# Patient Record
Sex: Male | Born: 1980 | Race: Black or African American | Hispanic: No | Marital: Single | Smoking: Current every day smoker
Health system: Southern US, Community
[De-identification: ages and names within clinical notes are randomized; demographics above are authoritative.]

## PROBLEM LIST (undated history)

## (undated) DIAGNOSIS — F419 Anxiety disorder, unspecified: Secondary | ICD-10-CM

## (undated) DIAGNOSIS — F32A Depression, unspecified: Secondary | ICD-10-CM

## (undated) DIAGNOSIS — F250 Schizoaffective disorder, bipolar type: Secondary | ICD-10-CM

## (undated) DIAGNOSIS — F329 Major depressive disorder, single episode, unspecified: Secondary | ICD-10-CM

## (undated) DIAGNOSIS — F259 Schizoaffective disorder, unspecified: Secondary | ICD-10-CM

---
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
## 2001-06-01 ENCOUNTER — Emergency Department (HOSPITAL_COMMUNITY): Admission: EM | Admit: 2001-06-01 | Discharge: 2001-06-02 | Payer: Self-pay | Admitting: Emergency Medicine

## 2002-04-11 ENCOUNTER — Emergency Department (HOSPITAL_COMMUNITY): Admission: EM | Admit: 2002-04-11 | Discharge: 2002-04-11 | Payer: Self-pay | Admitting: Emergency Medicine

## 2003-12-29 ENCOUNTER — Emergency Department (HOSPITAL_COMMUNITY): Admission: EM | Admit: 2003-12-29 | Discharge: 2003-12-29 | Payer: Self-pay | Admitting: Emergency Medicine

## 2004-05-20 ENCOUNTER — Emergency Department (HOSPITAL_COMMUNITY): Admission: EM | Admit: 2004-05-20 | Discharge: 2004-05-20 | Payer: Self-pay | Admitting: Emergency Medicine

## 2009-01-02 ENCOUNTER — Emergency Department (HOSPITAL_COMMUNITY): Admission: EM | Admit: 2009-01-02 | Discharge: 2009-01-02 | Payer: Self-pay | Admitting: Emergency Medicine

## 2009-02-24 ENCOUNTER — Inpatient Hospital Stay: Payer: Self-pay | Admitting: Psychiatry

## 2009-03-25 ENCOUNTER — Emergency Department: Payer: Self-pay | Admitting: Emergency Medicine

## 2009-08-02 ENCOUNTER — Inpatient Hospital Stay: Payer: Self-pay | Admitting: Psychiatry

## 2009-11-23 ENCOUNTER — Inpatient Hospital Stay: Payer: Self-pay | Admitting: Psychiatry

## 2010-06-27 ENCOUNTER — Inpatient Hospital Stay: Payer: Self-pay | Admitting: Unknown Physician Specialty

## 2010-07-06 ENCOUNTER — Emergency Department (HOSPITAL_COMMUNITY): Admission: EM | Admit: 2010-07-06 | Discharge: 2010-07-06 | Payer: Self-pay | Admitting: Emergency Medicine

## 2010-07-07 ENCOUNTER — Emergency Department (HOSPITAL_COMMUNITY): Admission: EM | Admit: 2010-07-07 | Discharge: 2010-07-07 | Payer: Self-pay | Admitting: Emergency Medicine

## 2010-10-18 ENCOUNTER — Emergency Department (HOSPITAL_COMMUNITY)
Admission: EM | Admit: 2010-10-18 | Discharge: 2010-10-19 | Disposition: A | Discharge status: Other Acute Inpatient Hospital | Payer: Medicare Other | Attending: Emergency Medicine | Admitting: Emergency Medicine

## 2010-10-18 DIAGNOSIS — R45850 Homicidal ideations: Secondary | ICD-10-CM | POA: Insufficient documentation

## 2010-10-18 DIAGNOSIS — F209 Schizophrenia, unspecified: Secondary | ICD-10-CM | POA: Insufficient documentation

## 2010-10-18 LAB — COMPREHENSIVE METABOLIC PANEL
Alkaline Phosphatase: 51 U/L (ref 39–117)
BUN: 12 mg/dL (ref 6–23)
CO2: 29 mEq/L (ref 19–32)
Chloride: 103 mEq/L (ref 96–112)
Creatinine, Ser: 1.1 mg/dL (ref 0.4–1.5)
GFR calc non Af Amer: >60 mL/min (ref >60)
Glucose, Bld: 93 mg/dL (ref 70–99)
Potassium: 4 mEq/L (ref 3.5–5.1)
Total Bilirubin: 0.7 mg/dL (ref 0.3–1.2)

## 2010-10-18 LAB — CBC
HCT: 45.3 % (ref 39.0–52.0)
Hemoglobin: 16.4 g/dL (ref 13.0–17.0)
MCH: 30.8 pg (ref 26.0–34.0)
MCV: 85 fL (ref 78.0–100.0)
Platelets: 227 10*3/uL (ref 150–400)
RBC: 5.33 MIL/uL (ref 4.22–5.81)
WBC: 7.1 10*3/uL (ref 4.0–10.5)

## 2010-10-18 LAB — DIFFERENTIAL
Eosinophils Absolute: 0.2 10*3/uL (ref 0.0–0.7)
Lymphocytes Relative: 48 % — ABNORMAL HIGH (ref 12–46)
Lymphs Abs: 3.4 10*3/uL (ref 0.7–4.0)
Monocytes Relative: 10 % (ref 3–12)
Neutrophils Relative %: 38 % — ABNORMAL LOW (ref 43–77)

## 2010-10-18 LAB — RAPID URINE DRUG SCREEN, HOSP PERFORMED: Cocaine: NOT DETECTED

## 2010-10-19 ENCOUNTER — Inpatient Hospital Stay (HOSPITAL_COMMUNITY)
Admission: AD | Admit: 2010-10-19 | Discharge: 2010-10-23 | DRG: 885 | Disposition: A | Discharge status: Home / Self Care | Payer: Medicaid Other | Source: Ambulatory Visit | Attending: Psychiatry | Admitting: Psychiatry

## 2010-10-19 DIAGNOSIS — F411 Generalized anxiety disorder: Secondary | ICD-10-CM

## 2010-10-19 DIAGNOSIS — F3289 Other specified depressive episodes: Secondary | ICD-10-CM

## 2010-10-19 DIAGNOSIS — R45850 Homicidal ideations: Secondary | ICD-10-CM

## 2010-10-19 DIAGNOSIS — F200 Paranoid schizophrenia: Principal | ICD-10-CM

## 2010-10-19 DIAGNOSIS — R45851 Suicidal ideations: Secondary | ICD-10-CM

## 2010-10-19 DIAGNOSIS — F329 Major depressive disorder, single episode, unspecified: Secondary | ICD-10-CM

## 2010-10-20 DIAGNOSIS — F200 Paranoid schizophrenia: Secondary | ICD-10-CM

## 2010-10-22 NOTE — H&P (Signed)
  NAME:  Jeffrey Coleman, Jeffrey Coleman NO.:  [id number]  MEDICAL RECORD NO.:  [id number]         PATIENT TYPE:  BIPS  LOCATION:  0403                          FACILITY:  [facility]  PHYSICIAN:  Eulogio Ditch, MD DATE OF BIRTH:  September 12, 1980  DATE OF ADMISSION:  10/20/2010 DATE OF DISCHARGE:                      PSYCHIATRIC ADMISSION ASSESSMENT   HISTORY OF PRESENT ILLNESS:  A 30 year old African American male with a history of schizophrenia paranoid type, living with the mother Jeffrey Coleman was admitted as he came to the ER for complaint of hearing voices as he is not on his medications, Haldol and Cogentin.  The patient told me that he started hearing voices after he came out of the jail.  The patient told me that he was on Haldol shot and he went to get his Haldol shot and they told him that his case was terminated.  The patient currently still hearing voices, noncommand type but earlier the patient told that he was hearing voices telling him to stab his mother and stab himself.  The patient denies abuse of any drugs.  MEDICAL HISTORY:  No current medical issues.  No known drug allergies.  PHYSICAL EXAMINATION:  Within normal limits done in [facility]. Labs within normal limits.  SUBSTANCE ABUSE HISTORY:  None.  MENTAL STATUS EXAM:  Calm, cooperative during interview.  Fair eye contact.  No psychomotor agitation or retardation noted during the interview.  Mood "okay".  Affect mood congruent.  Thought form logical and goal-directed.  Thought contents denies suicidal ideations but hearing command hallucinations, not delusional.  Thought perception auditory hallucination positive command type, does not seem to be internally preoccupied.  Cognition alert, awake, oriented x3.  Memory immediate, recent and remote fair.  Attention and concentration fair. Abstraction ability fair.  Fund of knowledge fair.  Insight and judgment intact.  DIAGNOSES:  AXIS I:  Schizophrenia,  paranoid type. AXIS II:  Deferred. AXIS III:  No medical issue. AXIS IV:  Financial issues, have difficulty in getting his medication in the outpatient setting. AXIS V:  30 to 40.  TREATMENT PLAN: 1. The patient will be started on Haldol 5 mg twice a day. 2. The patient will be given Haldol 50 mg dec x4 weeks. 3. Will try to get more collateral information on this patient, will     speak with the mother. 4. Estimated length of stay in the hospital will be 3-4 days. 5. The patient advised to go to all the groups.     Eulogio Ditch, MD     SA/MEDQ  D:  10/20/2010  T:  10/20/2010  Job:  [id number]  Electronically Signed by Eulogio Ditch  on 10/22/2010 06:23:37 AM

## 2010-11-01 NOTE — Discharge Summary (Signed)
Jeffrey Coleman, Jeffrey Coleman             ACCOUNT NO.:  [id number]  MEDICAL RECORD NO.:  [id number]           PATIENT TYPE:  I  LOCATION:  0403                          FACILITY:  [facility]  PHYSICIAN:  Eulogio Ditch, MD DATE OF BIRTH:  1981-08-30  DATE OF ADMISSION:  10/19/2010 DATE OF DISCHARGE:  10/23/2010                              DISCHARGE SUMMARY   IDENTIFYING INFORMATION:  This is a 30 year old African American male, single.  This is a voluntary admission.  HISTORY OF PRESENT ILLNESS:  [facility] admission for Jeffrey Coleman who lives with his mother in [place].  He presented  in our emergency room complaining of hearing voices since he had stopped his medications which were injectable Haldol and Cogentin.  He had been in jail for 20 days for failing to pay child support and had not been able to get into his mental health provider after completing his jail term.  He has a long history of auditory hallucinations which were previously well-controlled on Haldol and exist in subdued fashion when he is taking his medication regularly.  Without his medication he has difficulty sleeping, gets agitated and has difficulty discerning reality from unreal states.  He was concerned that he would harm himself.  He denies any use of substances.  MEDICAL EVALUATION AND DIAGNOSTIC STUDIES:  This is a normally developed [place] American male.  Normal motor.  No tremor or abnormal movements. AIM score 0.  No evidence of substance abuse.  Admitting vital signs: Temperature 98.4, pulse 74, respirations 24, blood pressure 130/84.  CBC normal with a hemoglobin of 16.4.  Urine drug screen negative for all substances.  Alcohol level negative.  Normal electrolytes with a BUN of 12, creatinine 1.10 and normal liver enzymes.  CHRONIC MEDICAL CONDITIONS:  Are none.  ADMITTING MENTAL STATUS EXAM:  Revealed a calm, cooperative, pleasant African American male with fair eye contact.  No agitation  or retardation in movement noted during the interview.  He reported his mood as neutral "okay".  Affect mood congruent.  Thinking logical, goal directed.  Positive for auditory hallucinations command type, but did not appear to be internally preoccupied.  Cognition intact.  Memory intact.  Attention and concentration fair.  Abstraction fair.  Fund of knowledge fair.  Insight and judgment intact.  ADMITTING DIAGNOSES:  AXIS I:  Schizophrenia paranoid type, chronic. AXIS II:  Deferred. AXIS III:  No diagnosis. AXIS IV:  Significant financial issues and difficulty with access to medications. AXIS V:  Current 30-40.  COURSE OF HOSPITALIZATION:  He was admitted to our stabilization unit with a plan to alleviate and control his auditory hallucinations.  We elected to start him back on Haldol 50 mg decanoate q.4 weeks and this was administered on February 17.  We also started him back on Haldol 5 mg p.o. twice daily along with benztropine 1 mg daily at bedtime.  He was gradually assimilated into the milieu.  He was pleasant and cooperative with peers and staff throughout his stay.  His participation in group therapy was appropriate.  He reported interest in returning to [facility] program in [place].  He planned on  returning to live with his mother but did not give [place] a release to speak with her.  By February 20 his hallucinations were diminished, fully alert, bright affect, no dangerous ideas, felt much better on the medications. Jeffrey Coleman was given information on how to get started back to services by going through [facility] on a walk-in basis.  He was given prescriptions and a 14 day supply of benztropine and oral Haldol.  DISCHARGE DIAGNOSES:  AXIS I:  Schizophrenia, paranoid type, chronic. AXIS II:  No diagnosis. AXIS III:  No diagnosis. AXIS IV:  Some financial constraints, having a stable home and supportive mother is an asset. AXIS V:  Current 60, past year not  known.  DISCHARGE MEDICATIONS: 1. Haldol decanoate 50 mg q.4 weeks IM, last given October 20, 2010. 2. Haldol 5 mg b.i.d. 3. Benztropine 1 mg p.o. q.h.s.  DISCHARGE PLAN:  [facility] in [place], [place], phone number [phone], walk in and sign up for treatment.     Margaret A. Lorin Picket, N.P.   ______________________________ Eulogio Ditch, MD    MAS/MEDQ  D:  10/24/2010  T:  10/24/2010  Job:  [id number]  Electronically Signed by Kari Baars N.P. on 10/30/2010 10:33:06 AM Electronically Signed by Eulogio Ditch  on 11/01/2010 01:03:10 PM

## 2010-12-12 LAB — BASIC METABOLIC PANEL
CO2: 22 mEq/L (ref 19–32)
Calcium: 9 mg/dL (ref 8.4–10.5)
GFR calc Af Amer: >60 mL/min (ref >60)
GFR calc non Af Amer: >60 mL/min (ref >60)
Glucose, Bld: 126 mg/dL — ABNORMAL HIGH (ref 70–99)
Potassium: 3.9 mEq/L (ref 3.5–5.1)
Sodium: 140 mEq/L (ref 135–145)

## 2010-12-12 LAB — CBC
HCT: 39.3 % (ref 39.0–52.0)
Hemoglobin: 13.9 g/dL (ref 13.0–17.0)
MCHC: 35.3 g/dL (ref 30.0–36.0)
RBC: 4.65 MIL/uL (ref 4.22–5.81)
RDW: 13.2 % (ref 11.5–15.5)

## 2010-12-12 LAB — DIFFERENTIAL
Basophils Absolute: 0.1 10*3/uL (ref 0.0–0.1)
Eosinophils Relative: 1 % (ref 0–5)
Lymphocytes Relative: 39 % (ref 12–46)
Monocytes Absolute: 0.5 10*3/uL (ref 0.1–1.0)
Monocytes Relative: 7 % (ref 3–12)
Neutro Abs: 4.4 10*3/uL (ref 1.7–7.7)

## 2010-12-12 LAB — RAPID URINE DRUG SCREEN, HOSP PERFORMED
Cocaine: NEGATIVE — AB
Opiates: NEGATIVE — AB
Tetrahydrocannabinol: NEGATIVE — AB

## 2012-04-09 LAB — DRUG SCREEN, URINE
Barbiturates, Ur Screen: NEGATIVE (ref ?–200)
Cocaine Metabolite,Ur ~~LOC~~: NEGATIVE (ref ?–300)
MDMA (Ecstasy)Ur Screen: NEGATIVE (ref ?–500)
Phencyclidine (PCP) Ur S: NEGATIVE (ref ?–25)
Tricyclic, Ur Screen: NEGATIVE (ref ?–1000)

## 2012-04-09 LAB — CBC
HCT: 54.6 % — ABNORMAL HIGH (ref 40.0–52.0)
MCH: 31.6 pg (ref 26.0–34.0)
MCV: 90 fL (ref 80–100)
Platelet: 230 10*3/uL (ref 150–440)
RBC: 6.07 10*6/uL — ABNORMAL HIGH (ref 4.40–5.90)
WBC: 9.6 10*3/uL (ref 3.8–10.6)

## 2012-04-09 LAB — BASIC METABOLIC PANEL
Chloride: 106 mmol/L (ref 98–107)
Co2: 28 mmol/L (ref 21–32)
Creatinine: 1.07 mg/dL (ref 0.60–1.30)
EGFR (African American): >60
Glucose: 85 mg/dL (ref 65–99)
Potassium: 4 mmol/L (ref 3.5–5.1)
Sodium: 139 mmol/L (ref 136–145)

## 2012-04-09 LAB — URINALYSIS, COMPLETE
Bilirubin,UR: NEGATIVE
Blood: NEGATIVE
Glucose,UR: NEGATIVE mg/dL (ref 0–75)
Ketone: NEGATIVE
Protein: NEGATIVE
RBC,UR: NONE SEEN /HPF (ref 0–5)
Specific Gravity: 1.003 (ref 1.003–1.030)
Squamous Epithelial: <1
WBC UR: NONE SEEN /HPF (ref 0–5)

## 2012-04-10 ENCOUNTER — Inpatient Hospital Stay: Payer: Self-pay | Admitting: Psychiatry

## 2014-06-23 ENCOUNTER — Emergency Department: Payer: Self-pay | Admitting: Emergency Medicine

## 2014-07-28 ENCOUNTER — Encounter (HOSPITAL_COMMUNITY): Payer: Self-pay | Admitting: *Deleted

## 2014-07-28 ENCOUNTER — Other Ambulatory Visit: Payer: Self-pay

## 2014-07-28 ENCOUNTER — Inpatient Hospital Stay (HOSPITAL_COMMUNITY)
Admission: AD | Admit: 2014-07-28 | Discharge: 2014-07-31 | DRG: 885 | Disposition: A | Discharge status: Home / Self Care | Payer: Medicare Other | Source: Intra-hospital | Attending: Psychiatry | Admitting: Psychiatry

## 2014-07-28 ENCOUNTER — Emergency Department (HOSPITAL_COMMUNITY)
Admission: EM | Admit: 2014-07-28 | Discharge: 2014-07-28 | Disposition: A | Discharge status: Home / Self Care | Payer: Medicare Other | Source: Home / Self Care | Attending: Emergency Medicine | Admitting: Emergency Medicine

## 2014-07-28 ENCOUNTER — Encounter (HOSPITAL_COMMUNITY): Payer: Self-pay | Admitting: Emergency Medicine

## 2014-07-28 DIAGNOSIS — R440 Auditory hallucinations: Secondary | ICD-10-CM

## 2014-07-28 DIAGNOSIS — F29 Unspecified psychosis not due to a substance or known physiological condition: Secondary | ICD-10-CM | POA: Diagnosis not present

## 2014-07-28 DIAGNOSIS — R45851 Suicidal ideations: Secondary | ICD-10-CM

## 2014-07-28 DIAGNOSIS — Z79899 Other long term (current) drug therapy: Secondary | ICD-10-CM

## 2014-07-28 DIAGNOSIS — F200 Paranoid schizophrenia: Principal | ICD-10-CM | POA: Diagnosis present

## 2014-07-28 DIAGNOSIS — F17210 Nicotine dependence, cigarettes, uncomplicated: Secondary | ICD-10-CM | POA: Diagnosis present

## 2014-07-28 HISTORY — DX: Schizoaffective disorder, unspecified: F25.9

## 2014-07-28 HISTORY — DX: Schizoaffective disorder, bipolar type: F25.0

## 2014-07-28 LAB — COMPREHENSIVE METABOLIC PANEL
ALK PHOS: 96 U/L (ref 39–117)
ALT: 28 U/L (ref 0–53)
AST: 26 U/L (ref 0–37)
Albumin: 3.4 g/dL — ABNORMAL LOW (ref 3.5–5.2)
Anion gap: 13 (ref 5–15)
BUN: 7 mg/dL (ref 6–23)
CHLORIDE: 93 meq/L — AB (ref 96–112)
CO2: 26 meq/L (ref 19–32)
Calcium: 9.2 mg/dL (ref 8.4–10.5)
Creatinine, Ser: 0.89 mg/dL (ref 0.50–1.35)
GLUCOSE: 107 mg/dL — AB (ref 70–99)
POTASSIUM: 4.4 meq/L (ref 3.7–5.3)
SODIUM: 132 meq/L — AB (ref 137–147)
Total Bilirubin: 1.3 mg/dL — ABNORMAL HIGH (ref 0.3–1.2)
Total Protein: 8.1 g/dL (ref 6.0–8.3)

## 2014-07-28 LAB — URINALYSIS, ROUTINE W REFLEX MICROSCOPIC
BILIRUBIN URINE: NEGATIVE
Glucose, UA: NEGATIVE mg/dL
Ketones, ur: NEGATIVE mg/dL
Leukocytes, UA: NEGATIVE
NITRITE: NEGATIVE
PROTEIN: NEGATIVE mg/dL
SPECIFIC GRAVITY, URINE: 1.01 (ref 1.005–1.030)
UROBILINOGEN UA: 4 mg/dL — AB (ref 0.0–1.0)
pH: 6 (ref 5.0–8.0)

## 2014-07-28 LAB — CBC WITH DIFFERENTIAL/PLATELET
Basophils Absolute: 0 10*3/uL (ref 0.0–0.1)
Basophils Relative: 0 % (ref 0–1)
Eosinophils Absolute: 0 10*3/uL (ref 0.0–0.7)
Eosinophils Relative: 0 % (ref 0–5)
HCT: 50.2 % (ref 39.0–52.0)
Hemoglobin: 18.6 g/dL — ABNORMAL HIGH (ref 13.0–17.0)
LYMPHS ABS: 2.6 10*3/uL (ref 0.7–4.0)
LYMPHS PCT: 17 % (ref 12–46)
MCH: 32.3 pg (ref 26.0–34.0)
MCHC: 37.1 g/dL — ABNORMAL HIGH (ref 30.0–36.0)
MCV: 87.2 fL (ref 78.0–100.0)
Monocytes Absolute: 1.6 10*3/uL — ABNORMAL HIGH (ref 0.1–1.0)
Monocytes Relative: 11 % (ref 3–12)
NEUTROS ABS: 11 10*3/uL — AB (ref 1.7–7.7)
NEUTROS PCT: 72 % (ref 43–77)
PLATELETS: 265 10*3/uL (ref 150–400)
RBC: 5.76 MIL/uL (ref 4.22–5.81)
RDW: 13.8 % (ref 11.5–15.5)
WBC: 15.2 10*3/uL — AB (ref 4.0–10.5)

## 2014-07-28 LAB — RAPID URINE DRUG SCREEN, HOSP PERFORMED
Amphetamines: NOT DETECTED
BARBITURATES: NOT DETECTED
BENZODIAZEPINES: NOT DETECTED
Cocaine: NOT DETECTED
Opiates: NOT DETECTED
TETRAHYDROCANNABINOL: NOT DETECTED

## 2014-07-28 LAB — ETHANOL

## 2014-07-28 LAB — URINE MICROSCOPIC-ADD ON

## 2014-07-28 MED ORDER — BENZTROPINE MESYLATE 2 MG PO TABS
3.0000 mg | ORAL_TABLET | Freq: Every day | ORAL | Status: DC
  Administered 2014-07-28 – 2014-07-30 (×3): 3 mg via ORAL
  Filled 2014-07-28 (×4): qty 1

## 2014-07-28 MED ORDER — NICOTINE 21 MG/24HR TD PT24
21.0000 mg | MEDICATED_PATCH | Freq: Every day | TRANSDERMAL | Status: DC
  Filled 2014-07-28 (×6): qty 1

## 2014-07-28 MED ORDER — LORAZEPAM 1 MG PO TABS: 1.0000 mg | ORAL_TABLET | Freq: Three times a day (TID) | ORAL | Status: DC | PRN

## 2014-07-28 MED ORDER — HALOPERIDOL 2 MG PO TABS
ORAL_TABLET | ORAL | Status: AC
  Filled 2014-07-28: qty 1

## 2014-07-28 MED ORDER — HALOPERIDOL 0.5 MG PO TABS
0.5000 mg | ORAL_TABLET | Freq: Two times a day (BID) | ORAL | Status: DC
  Administered 2014-07-28: 0.5 mg via ORAL
  Filled 2014-07-28 (×7): qty 1

## 2014-07-28 MED ORDER — BENZTROPINE MESYLATE 1 MG PO TABS
1.0000 mg | ORAL_TABLET | Freq: Two times a day (BID) | ORAL | Status: DC
  Filled 2014-07-28 (×3): qty 1

## 2014-07-28 MED ORDER — HALOPERIDOL 0.5 MG PO TABS
0.5000 mg | ORAL_TABLET | Freq: Two times a day (BID) | ORAL | Status: DC
  Filled 2014-07-28 (×3): qty 1

## 2014-07-28 MED ORDER — ACETAMINOPHEN 325 MG PO TABS
650.0000 mg | ORAL_TABLET | Freq: Four times a day (QID) | ORAL | Status: DC | PRN
  Administered 2014-07-28 – 2014-07-31 (×5): 650 mg via ORAL
  Filled 2014-07-28 (×5): qty 2

## 2014-07-28 MED ORDER — HALOPERIDOL 5 MG PO TABS
5.0000 mg | ORAL_TABLET | Freq: Every day | ORAL | Status: DC
  Administered 2014-07-29 – 2014-07-30 (×2): 5 mg via ORAL
  Filled 2014-07-28 (×4): qty 1

## 2014-07-28 MED ORDER — QUETIAPINE FUMARATE ER 50 MG PO TB24
50.0000 mg | ORAL_TABLET | Freq: Every day | ORAL | Status: DC
  Administered 2014-07-28 – 2014-07-29 (×2): 50 mg via ORAL
  Filled 2014-07-28 (×4): qty 1

## 2014-07-28 MED ORDER — MAGNESIUM HYDROXIDE 400 MG/5ML PO SUSP: 30.0000 mL | Freq: Every day | ORAL | Status: DC | PRN

## 2014-07-28 MED ORDER — ALUM & MAG HYDROXIDE-SIMETH 200-200-20 MG/5ML PO SUSP: 30.0000 mL | ORAL | Status: DC | PRN

## 2014-07-28 MED ORDER — BENZTROPINE MESYLATE 1 MG PO TABS
ORAL_TABLET | ORAL | Status: AC
  Filled 2014-07-28: qty 3

## 2014-07-28 MED ORDER — HALOPERIDOL 1 MG PO TABS
1.0000 mg | ORAL_TABLET | Freq: Once | ORAL | Status: AC
  Administered 2014-07-28: 1 mg via ORAL
  Filled 2014-07-28: qty 1

## 2014-07-28 MED ORDER — NICOTINE 21 MG/24HR TD PT24: 21.0000 mg | MEDICATED_PATCH | Freq: Every day | TRANSDERMAL | Status: DC

## 2014-07-28 MED ORDER — HALOPERIDOL 5 MG PO TABS
10.0000 mg | ORAL_TABLET | Freq: Every day | ORAL | Status: DC
  Filled 2014-07-28: qty 2

## 2014-07-28 MED ORDER — ONDANSETRON HCL 4 MG PO TABS: 4.0000 mg | ORAL_TABLET | Freq: Three times a day (TID) | ORAL | Status: DC | PRN

## 2014-07-28 NOTE — ED Notes (Signed)
Per [facility], pt is having suicidal thoughts, stop taking meds approx 6 weeks ago.

## 2014-07-28 NOTE — Tx Team (Signed)
Initial Interdisciplinary Treatment Plan   PATIENT STRESSORS: Medication change or noncompliance   PATIENT STRENGTHS: Ability for insight Active sense of humor Capable of independent living Communication skills   PROBLEM LIST: Problem List/Patient Goals Date to be addressed Date deferred Reason deferred Estimated date of resolution  psychosis 07/28/14   dc                                                   DISCHARGE CRITERIA:  Ability to meet basic life and health needs Improved stabilization in mood, thinking, and/or behavior Motivation to continue treatment in a less acute level of care Reduction of life-threatening or endangering symptoms to within safe limits Verbal commitment to aftercare and medication compliance  PRELIMINARY DISCHARGE PLAN: Attend aftercare/continuing care group Attend 12-step recovery group Placement in alternative living arrangements Return to previous living arrangement  PATIENT/FAMIILY INVOLVEMENT: This treatment plan has been presented to and reviewed with the patient, Jeffrey Coleman, and/or family member, .  The patient and family have been given the opportunity to ask questions and make suggestions.  Jeffrey Coleman 07/28/2014, 5:28 PM

## 2014-07-28 NOTE — ED Notes (Signed)
Patient wand by Security and changed into wine paper scrubs. Patient assisted to restroom obtained urine sample. Patient cooperative at this time. Sitter has patient in view.
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
## 2014-07-28 NOTE — ED Notes (Signed)
Report attempted with [facility] and was told to call back in 51min to give report.

## 2014-07-28 NOTE — ED Notes (Signed)
[facility] called and ETA 19min for transport.

## 2014-07-28 NOTE — ED Notes (Signed)
Report given to [place], Therapist, sports for [facility] room [phone].

## 2014-07-28 NOTE — ED Notes (Signed)
Pt has start hearing voices telling him to cut his wrist to end his life, not currently taking psych meds.

## 2014-07-28 NOTE — ED Notes (Signed)
Tele psych in progress at this time. 

## 2014-07-28 NOTE — ED Provider Notes (Signed)
CSN: [id number]     Arrival date & time 07/28/14  0136 History   First MD Initiated Contact with Patient 07/28/14 0248     Chief Complaint  Patient presents with  . Suicidal Thoughts     (Consider location/radiation/quality/duration/timing/severity/associated sxs/prior Treatment) HPI  This is a 33 year old male with a history of schizophrenia. He is not out of his medication but has not been compliant with it recently thinking he was "straight" without it. As a result he is developed suicidal thoughts over the past 3 days which acutely worsened this morning. The severity of the suicidal thoughts scared him and so he called EMS for transport to the ED. He states he always has auditory hallucinations but only recently have they been telling him how to kill himself, specifically to cut his wrist. He has no somatic complaints. He denies HI.  Past Medical History  Diagnosis Date  . Schizo affective schizophrenia    History reviewed. No pertinent past surgical history. Family History  Problem Relation Age of Onset  . Cancer Father    History  Substance Use Topics  . Smoking status: Current Every Day Smoker -- 0.50 packs/day for 15 years    Types: Cigarettes  . Smokeless tobacco: Never Used  . Alcohol Use: No    Review of Systems  All other systems reviewed and are negative.   Allergies  Review of patient's allergies indicates no known allergies.  Home Medications   Prior to Admission medications   Medication Sig Start Date End Date Taking? Authorizing Provider  haloperidol (HALDOL) 0.5 MG tablet Take 0.5 mg by mouth 2 (two) times daily.   Yes Historical Provider, MD   BP 125/86 mmHg  Pulse 81  Temp(Src) 99.1 F (37.3 C) (Oral)  Resp 20  Ht 5\' 4"  (1.626 m)  Wt 140 lb (63.504 kg)  BMI 24.02 kg/m2  SpO2 98%   Physical Exam  General: Well-developed, well-nourished male in no acute distress; appearance consistent with age of record HENT: normocephalic; atraumatic Eyes:  pupils equal, round and reactive to light; extraocular muscles intact Neck: supple Heart: regular rate and rhythm Lungs: clear to auscultation bilaterally Abdomen: soft; nondistended; nontender; no masses or hepatosplenomegaly; bowel sounds present Extremities: No deformity; full range of motion; pulses normal Neurologic: Awake, alert and oriented; motor function intact in all extremities and symmetric; no facial droop Skin: Warm and dry Psychiatric: Pleasant; cooperative; auditory hallucinations; suicidal ideation; no homicidal ideation    ED Course  Procedures (including critical care time)   MDM   Nursing notes and vitals signs, including pulse oximetry, reviewed.  Summary of this visit's results, reviewed by myself:  Labs:  Results for orders placed or performed during the hospital encounter of 07/28/14 (from the past 24 hour(s))  Drug screen panel, emergency     Status: None   Collection Time: 07/28/14  1:40 AM  Result Value Ref Range   Opiates NONE DETECTED NONE DETECTED   Cocaine NONE DETECTED NONE DETECTED   Benzodiazepines NONE DETECTED NONE DETECTED   Amphetamines NONE DETECTED NONE DETECTED   Tetrahydrocannabinol NONE DETECTED NONE DETECTED   Barbiturates NONE DETECTED NONE DETECTED  Urinalysis, Routine w reflex microscopic     Status: Abnormal   Collection Time: 07/28/14  1:40 AM  Result Value Ref Range   Color, Urine AMBER (A) YELLOW   APPearance CLEAR CLEAR   Specific Gravity, Urine 1.010 1.005 - 1.030   pH 6.0 5.0 - 8.0   Glucose, UA NEGATIVE NEGATIVE mg/dL  Hgb urine dipstick TRACE (A) NEGATIVE   Bilirubin Urine NEGATIVE NEGATIVE   Ketones, ur NEGATIVE NEGATIVE mg/dL   Protein, ur NEGATIVE NEGATIVE mg/dL   Urobilinogen, UA 4.0 (H) 0.0 - 1.0 mg/dL   Nitrite NEGATIVE NEGATIVE   Leukocytes, UA NEGATIVE NEGATIVE  Urine microscopic-add on     Status: None   Collection Time: 07/28/14  1:40 AM  Result Value Ref Range   Squamous Epithelial / LPF RARE RARE    WBC, UA 0-2 <3 WBC/hpf   RBC / HPF 0-2 <3 RBC/hpf   Bacteria, UA RARE RARE  CBC WITH DIFFERENTIAL     Status: Abnormal   Collection Time: 07/28/14  2:08 AM  Result Value Ref Range   WBC 15.2 (H) 4.0 - 10.5 K/uL   RBC 5.76 4.22 - 5.81 MIL/uL   Hemoglobin 18.6 (H) 13.0 - 17.0 g/dL   HCT 50.2 39.0 - 52.0 %   MCV 87.2 78.0 - 100.0 fL   MCH 32.3 26.0 - 34.0 pg   MCHC 37.1 (H) 30.0 - 36.0 g/dL   RDW 13.8 11.5 - 15.5 %   Platelets 265 150 - 400 K/uL   Neutrophils Relative % 72 43 - 77 %   Neutro Abs 11.0 (H) 1.7 - 7.7 K/uL   Lymphocytes Relative 17 12 - 46 %   Lymphs Abs 2.6 0.7 - 4.0 K/uL   Monocytes Relative 11 3 - 12 %   Monocytes Absolute 1.6 (H) 0.1 - 1.0 K/uL   Eosinophils Relative 0 0 - 5 %   Eosinophils Absolute 0.0 0.0 - 0.7 K/uL   Basophils Relative 0 0 - 1 %   Basophils Absolute 0.0 0.0 - 0.1 K/uL  Comprehensive metabolic panel     Status: Abnormal   Collection Time: 07/28/14  2:08 AM  Result Value Ref Range   Sodium 132 (L) 137 - 147 mEq/L   Potassium 4.4 3.7 - 5.3 mEq/L   Chloride 93 (L) 96 - 112 mEq/L   CO2 26 19 - 32 mEq/L   Glucose, Bld 107 (H) 70 - 99 mg/dL   BUN 7 6 - 23 mg/dL   Creatinine, Ser 0.89 0.50 - 1.35 mg/dL   Calcium 9.2 8.4 - 10.5 mg/dL   Total Protein 8.1 6.0 - 8.3 g/dL   Albumin 3.4 (L) 3.5 - 5.2 g/dL   AST 26 0 - 37 U/L   ALT 28 0 - 53 U/L   Alkaline Phosphatase 96 39 - 117 U/L   Total Bilirubin 1.3 (H) 0.3 - 1.2 mg/dL   GFR calc non Af Amer >90 >90 mL/min   GFR calc Af Amer >90 >90 mL/min   Anion gap 13 5 - 15  Ethanol     Status: None   Collection Time: 07/28/14  2:08 AM  Result Value Ref Range   Alcohol, Ethyl (B) <11 0 - 11 mg/dL      Wynetta Fines, MD 07/28/14 [phone]

## 2014-07-28 NOTE — Progress Notes (Signed)
Patient ID: Jeffrey Coleman, male   DOB: 10-28-1980, 33 y.o.   MRN: [id number] PER STATE REGULATIONS 482.30  THIS CHART WAS REVIEWED FOR MEDICAL NECESSITY WITH RESPECT TO THE PATIENT'S ADMISSION/DURATION OF STAY.  NEXT REVIEW DATE: 08/01/14  Roma Schanz, RN, BSN CASE MANAGER

## 2014-07-28 NOTE — Progress Notes (Addendum)
Pt has been accepted to [facility], room 506-2, accepting Catalina Pizza, NP/Dr. Lucienne Minks RN aware. Pt is voluntary.  Charlene Brooke, MSW  Social Worker [phone]

## 2014-07-28 NOTE — BH Assessment (Signed)
Tele Assessment Note   Jeffrey Coleman is a 33 y.o. male who voluntarily presents to [facility] with SI thoughts and AH w/command to harm self.  Pt denies plan but states--"If I were going to do it, I would slit my wrists".  Pt reports SI thoughts x3days and has been consistently hearing voices for 3 yrs.  Pt admits that he stopped taking his medication approx  6wks ago because he was "feeling better".  Pt reports 1 previous SI attempt 1.5 yrs ago by cutting himself.  Pt has outpatient services with [facility] and states he attends group on Mondays and will be returning to his psych to being medication again.  Pt denies any depressive sxs.  He is not responding to internal stimuli and is lucid during the interview with this Probation officer, communicates well and understand his schizophrenia dx.     Axis I: Schizophrenia Axis II: Deferred Axis III:  Past Medical History  Diagnosis Date  . Schizo affective schizophrenia    Axis IV: other psychosocial or environmental problems, problems related to social environment, problems with access to health care services and problems with primary support group Axis V: 31-40 impairment in reality testing  Past Medical History:  Past Medical History  Diagnosis Date  . Schizo affective schizophrenia     History reviewed. No pertinent past surgical history.  Family History:  Family History  Problem Relation Age of Onset  . Cancer Father     Social History:  reports that he has been smoking Cigarettes.  He has a 7.5 pack-year smoking history. He has never used smokeless tobacco. He reports that he does not drink alcohol or use illicit drugs.  Additional Social History:     CIWA: CIWA-Ar BP: 125/86 mmHg Pulse Rate: 81 COWS:    PATIENT STRENGTHS: (choose at least two) General fund of knowledge  Allergies: No Known Allergies  Home Medications:  (Not in a hospital admission)  OB/GYN Status:  No LMP for male patient.  General Assessment Data Location  of Assessment: [facility] ED Is this a Tele or Face-to-Face Assessment?: Tele Assessment Is this an Initial Assessment or a Re-assessment for this encounter?: Initial Assessment Living Arrangements: Parent (Lives with mother ) Can pt return to current living arrangement?: Yes Admission Status: Voluntary Is patient capable of signing voluntary admission?: Yes Transfer from: Home Referral Source: Self/Family/Friend  Medical Screening Exam ([place]) Medical Exam completed: No Reason for MSE not completed: Other: (None )  [place] Living Arrangements: Parent (Lives with mother ) Name of Psychiatrist: Armen Coleman  Name of Therapist: Armen Coleman   Education Status Is patient currently in school?: No Current Grade: None  Highest grade of school patient has completed: None  Name of school: None  Contact person: None   Risk to self with the past 6 months Suicidal Ideation: Yes-Currently Present Suicidal Intent: No-Not Currently/Within Last 6 Months Is patient at risk for suicide?: Yes Suicidal Plan?: No-Not Currently/Within Last 6 Months ("If was going to do it, I would cut myself with a box cutter) Access to Means: Yes Specify Access to Suicidal Means: Sharps, Pills  What has been your use of drugs/alcohol within the last 12 months?: Pt denies  Previous Attempts/Gestures: Yes How many times?: 1 Other Self Harm Risks: None  Triggers for Past Attempts: Unpredictable Intentional Self Injurious Behavior: None Family Suicide History: No Recent stressful life event(s): Other (Comment) (Off meds x 1 month ) Persecutory voices/beliefs?: No Depression: No Depression Symptoms:  (  None reported ) Substance abuse history and/or treatment for substance abuse?: Yes Suicide prevention information given to non-admitted patients: Not applicable  Risk to Others within the past 6 months Homicidal Ideation: No Thoughts of Harm to Others: No Current Homicidal Intent: No Current  Homicidal Plan: No Access to Homicidal Means: No Identified Victim: None  History of harm to others?: No Assessment of Violence: None Noted Violent Behavior Description: None  Does patient have access to weapons?: No Criminal Charges Pending?: No Does patient have a court date: No  Psychosis Hallucinations: Auditory, With command Delusions: None noted  Mental Status Report Appear/Hygiene: In scrubs Eye Contact: Good Motor Activity: Unremarkable Speech: Logical/coherent Level of Consciousness: Alert, Quiet/awake Mood: Preoccupied Affect: Preoccupied Anxiety Level: None Thought Processes: Coherent, Relevant Judgement: Impaired Orientation: Person, Place, Time, Situation Obsessive Compulsive Thoughts/Behaviors: None  Cognitive Functioning Concentration: Normal Memory: Recent Intact, Remote Intact IQ: Average Insight: Poor Impulse Control: Fair Appetite: Fair Weight Loss: 0 Weight Gain: 0 Sleep: No Change Total Hours of Sleep: 5 Vegetative Symptoms: None  ADLScreening [facility]) Patient's cognitive ability adequate to safely complete daily activities?: Yes Patient able to express need for assistance with ADLs?: Yes Independently performs ADLs?: Yes (appropriate for developmental age)  Prior Inpatient Therapy Prior Inpatient Therapy: Yes Prior Therapy Dates: 2012,2014 Prior Therapy Facilty/Provider(s): [facility], [place]  Reason for Treatment: Schizophrenia   Prior Outpatient Therapy Prior Outpatient Therapy: Yes Prior Therapy Dates: Current  Prior Therapy Facilty/Provider(s): [facility]  Reason for Treatment: Med Mgt/ Therapy   ADL Screening (condition at time of admission) Patient's cognitive ability adequate to safely complete daily activities?: Yes Patient able to express need for assistance with ADLs?: Yes Independently performs ADLs?: Yes (appropriate for developmental age)             Regulatory affairs officer (For Healthcare) Does  patient have an advance directive?: No Would patient like information on creating an advanced directive?: No - patient declined information    Additional Information 1:1 In Past 12 Months?: No CIRT Risk: No Elopement Risk: No Does patient have medical clearance?: Yes     Disposition:  Disposition Initial Assessment Completed for this Encounter: Yes Disposition of Patient: Inpatient treatment program, Referred to [name], [place] recommend inpt admission ) Type of inpatient treatment program: Adult Patient referred to: Other (Comment) [name], [place] recommend inpt admission )  Girtha Rm 07/28/2014 4:59 AM

## 2014-07-29 DIAGNOSIS — F209 Schizophrenia, unspecified: Secondary | ICD-10-CM

## 2014-07-29 DIAGNOSIS — R45851 Suicidal ideations: Secondary | ICD-10-CM

## 2014-07-29 DIAGNOSIS — F200 Paranoid schizophrenia: Secondary | ICD-10-CM | POA: Insufficient documentation

## 2014-07-29 LAB — CBC WITH DIFFERENTIAL/PLATELET
Band Neutrophils: 0 % (ref 0–10)
Basophils Absolute: 0 10*3/uL (ref 0.0–0.1)
Basophils Relative: 0 % (ref 0–1)
Blasts: 0 %
EOS ABS: 0 10*3/uL (ref 0.0–0.7)
EOS PCT: 0 % (ref 0–5)
HCT: 47.4 % (ref 39.0–52.0)
HEMOGLOBIN: 17.4 g/dL — AB (ref 13.0–17.0)
LYMPHS ABS: 2.3 10*3/uL (ref 0.7–4.0)
LYMPHS PCT: 16 % (ref 12–46)
MCH: 31.9 pg (ref 26.0–34.0)
MCHC: 36.7 g/dL — ABNORMAL HIGH (ref 30.0–36.0)
MCV: 86.8 fL (ref 78.0–100.0)
MONO ABS: 1.6 10*3/uL — AB (ref 0.1–1.0)
Metamyelocytes Relative: 0 %
Monocytes Relative: 11 % (ref 3–12)
Myelocytes: 0 %
NEUTROS ABS: 10.6 10*3/uL — AB (ref 1.7–7.7)
NEUTROS PCT: 73 % (ref 43–77)
Platelets: 304 10*3/uL (ref 150–400)
Promyelocytes Absolute: 0 %
RBC: 5.46 MIL/uL (ref 4.22–5.81)
RDW: 13.7 % (ref 11.5–15.5)
WBC: 14.5 10*3/uL — ABNORMAL HIGH (ref 4.0–10.5)
nRBC: 0 /100 WBC

## 2014-07-29 LAB — INFLUENZA PANEL BY PCR (TYPE A & B)
H1N1 flu by pcr: NOT DETECTED
INFLAPCR: NEGATIVE
Influenza B By PCR: NEGATIVE

## 2014-07-29 MED ORDER — BENZTROPINE MESYLATE 1 MG PO TABS
ORAL_TABLET | ORAL | Status: AC
  Filled 2014-07-29: qty 3

## 2014-07-29 MED ORDER — IBUPROFEN 600 MG PO TABS
600.0000 mg | ORAL_TABLET | ORAL | Status: DC | PRN
  Administered 2014-07-29: 600 mg via ORAL
  Filled 2014-07-29: qty 1
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
## 2014-07-29 NOTE — Progress Notes (Signed)
Writer spoke with patient 1:1 and he c/o headache and requested a tylenol. He reported that he had vomited in his trash can and writer observed sweat on his forehead while talking with him. Patient denies feeling achy and chills temperature was taken at 2110 and was 99.4. Patient c/o having a cough for about 2 days before being admitted. He denies si/hi and visual hallucinations but is positive for voices and reports that he always hears voices but his medications helps with them. He was did not attend group this evening. Writer asked that he Biochemist, clinical or a staff member know if if gets sick again and gatorade given for him to drink. Safety maintained on unit with 15 min checks.

## 2014-07-29 NOTE — [facility]HH Suicide Risk Assessment (Signed)
   Nursing information obtained from:  Patient Demographic factors:  Male, Adolescent or young adult, Low socioeconomic status Current Mental Status:  Self-harm thoughts Loss Factors:  NA Historical Factors:  Impulsivity Risk Reduction Factors:  Living with another person, especially a relative Total Time spent with patient: 1 hour  CLINICAL FACTORS:   Schizophrenia:   Command hallucinatons Depressive state Less than 33 years old Paranoid or undifferentiated type More than one psychiatric diagnosis Currently Psychotic Previous Psychiatric Diagnoses and Treatments  Psychiatric Specialty Exam: Physical Exam  ROS  Blood pressure 118/76, pulse 106, temperature 101.2 F (38.4 C), temperature source Oral, resp. rate 17, height 5' 3.25" (1.607 m), weight 59.875 kg (132 lb).Body mass index is 23.19 kg/(m^2).  General Appearance: Disheveled  Eye Contact::  Good  Speech:  Slow  Volume:  Decreased  Mood:  Depressed and Hopeless  Affect:  Constricted, Depressed and Restricted  Thought Process:  Coherent  Orientation:  Full (Time, Place, and Person)  Thought Content:  Hallucinations: Auditory and Paranoid Ideation  Suicidal Thoughts:  Yes.  with intent/plan  Homicidal Thoughts:  No  Memory:  Immediate;   Fair Recent;   Fair Remote;   Fair  Judgement:  Impaired  Insight:  Lacking  Psychomotor Activity:  Decreased  Concentration:  Fair  Recall:  [place]  Language: Fair  Akathisia:  No  Handed:  Right  AIMS (if indicated):     Assets:  Communication Skills Desire for Improvement Housing  Sleep:  Number of Hours: 6.75   Musculoskeletal: Strength & Muscle Tone: within normal limits Gait & Station: normal Patient leans: N/A  COGNITIVE FEATURES THAT CONTRIBUTE TO RISK:  Loss of executive function Polarized thinking Thought constriction (tunnel vision)    SUICIDE RISK:   Moderate:  Frequent suicidal ideation with limited intensity, and duration, some  specificity in terms of plans, no associated intent, good self-control, limited dysphoria/symptomatology, some risk factors present, and identifiable protective factors, including available and accessible social support.  PLAN OF CARE:  I certify that inpatient services furnished can reasonably be expected to improve the patient's condition.  Tyjai Matuszak T. 07/29/2014, 12:09 PM

## 2014-07-29 NOTE — Progress Notes (Signed)
Patient ID: Jeffrey Coleman, male   DOB: 1981-05-02, 33 y.o.   MRN: [id number] D. Patient presents with depressed mood, affect blunted. Jeffrey Coleman has been cooperative throughout shift thus far . He states '' Yes I'm still hearing voices, it's much better, just a muttering now '' He denies any SI/HI/ and is able to contract for safety. Jeffrey Coleman continues to endorse body aches/malaise and is noted to continue to have elevated temp at times. Discussed above information with May NP and Dr. Adele Schilder due to patients flu like symptoms. (and lab results elevated wbc) He has been encouraged drink po fluids, and gatorade provided. Orders received for flu swab and culture obtained. Medications given as ordered, including prn tylenol for pain/fever. No further voiced concerns at this time. Will continue to monitor q 15 minutes for safety.

## 2014-07-29 NOTE — Plan of Care (Signed)
Problem: Ineffective individual coping Goal: STG: Patient will remain free from self harm Outcome: Progressing Patient has remained free from self harm.     

## 2014-07-29 NOTE — Progress Notes (Signed)
D: Pt has depressed affect and mood.  Pt stayed in his room for the majority of the evening.  Pt reports his day was "pretty good.  I'm just a little achy feeling."  Pt reports his goal today was "just to feel better I guess.  I feel a little better than yesterday."  Pt denies SI/HI.  Pt denies having hallucinations currently.  Pt did not attend evening group.  He interacted with staff appropriately and had minimal interactions with peers.   A: Met with pt 1:1 and offered support and encouragement.  PRN medication administered for elevated temperature and pain, see flowsheet.  Fluid encouraged.  Pt educated on infection prevention.  Medication administered per order.  Safety maintained.   R: Pt is compliant with medication.  Pt reports he will notify writer of needs and concerns.  Pt verbally contracts for safety.  Will continue to monitor and assess for safety.

## 2014-07-29 NOTE — Progress Notes (Signed)
Adult Psychoeducational Group Note  Date:  07/29/2014 Time:  0900  Group Topic/Focus:   Goals Group:   The focus of this group is to help patients establish daily goals to achieve during treatment and discuss how the patient can incorporate goal setting into their daily lives to aide in recovery.  Participation Level:  Did Not Attend  Participation Quality:    Affect:    Cognitive:    Insight:   Engagement in Group:    Modes of Intervention:    Additional Comments:    Pebbles Zeiders L 07/29/2014, 12:51 PM

## 2014-07-30 DIAGNOSIS — F200 Paranoid schizophrenia: Principal | ICD-10-CM

## 2014-07-30 MED ORDER — BENZTROPINE MESYLATE 1 MG PO TABS
1.0000 mg | ORAL_TABLET | Freq: Two times a day (BID) | ORAL | Status: DC
  Administered 2014-07-30 – 2014-07-31 (×2): 1 mg via ORAL
  Filled 2014-07-30: qty 1
  Filled 2014-07-30: qty 28
  Filled 2014-07-30 (×2): qty 1
  Filled 2014-07-30: qty 28
  Filled 2014-07-30: qty 1

## 2014-07-30 MED ORDER — HALOPERIDOL 5 MG PO TABS
5.0000 mg | ORAL_TABLET | Freq: Two times a day (BID) | ORAL | Status: DC
  Administered 2014-07-30 – 2014-07-31 (×2): 5 mg via ORAL
  Filled 2014-07-30 (×3): qty 1
  Filled 2014-07-30: qty 28
  Filled 2014-07-30: qty 1
  Filled 2014-07-30: qty 28

## 2014-07-30 NOTE — Progress Notes (Signed)
D.  Pt pleasant on approach, denies complaints at this time.  Positive for evening wrap up group, see group notes.  Interacting appropriately with peers on the unit.  Feels ready to discharge.  A.  Support and encouragement offered  R.  Pt remains safe on the unit, will continue to monitor.

## 2014-07-30 NOTE — Plan of Care (Signed)
Problem: Diagnosis: Increased Risk For Suicide Attempt Goal: STG-Patient Will Comply With Medication Regime Outcome: Progressing Pt has been compliant with all medications this shift.       

## 2014-07-30 NOTE — Progress Notes (Signed)
[facility] MD Progress Note  07/30/2014 9:53 AM Jeffrey Coleman  MRN:  [id number]   Subjective:  I am feeling better with the medication.  Objective  Patient seen chart reviewed.  Patient is taking Haldol 5 mg and Seroquel at bedtime.  Yesterday he had a temperature and he was given acetaminophen on a regular basis.  He is feeling better.  He continued to have hallucination and paranoia but it is getting better.  His sleep improved from the past.  Patient has no shakes or tremors.  He remains guarded and withdrawn and does not participate in the groups.   Diagnosis:   DSM5: Schizophrenia Disorders:  Schizophrenia (295.7)   Total Time spent with patient: 30 minutes  Axis I: Schizophrenia chronic paranoid type Axis II: Deferred Axis III:  Past Medical History  Diagnosis Date  . Schizo affective schizophrenia     ADL's:  Intact  Sleep: Fair  Appetite:  Fair  Suicidal Ideation:  Plan:  Continued to endorse suicidal thoughts but no plan Intent:  Denies Means:  Denies Homicidal Ideation:  Plan:  Denies Intent:  Denies Means:  Denies AEB (as evidenced by):  Psychiatric Specialty Exam: Physical Exam  ROS  Blood pressure 121/91, pulse 101, temperature 99.3 F (37.4 C), temperature source Oral, resp. rate 16, height 5' 3.25" (1.607 m), weight 59.875 kg (132 lb).Body mass index is 23.19 kg/(m^2).  General Appearance: Guarded  Eye Contact::  Minimal  Speech:  Slow  Volume:  Decreased  Mood:  Dysphoric and Irritable  Affect:  Constricted and Depressed  Thought Process:  Loose  Orientation:  Full (Time, Place, and Person)  Thought Content:  Hallucinations: Auditory and Paranoid Ideation  Suicidal Thoughts:  Yes.  without intent/plan  Homicidal Thoughts:  No  Memory:  Immediate;   Fair Recent;   Fair Remote;   Fair  Judgement:  Impaired  Insight:  Lacking  Psychomotor Activity:  Decreased  Concentration:  Fair  Recall:  [place]: Fair   Akathisia:  No  Handed:  Right  AIMS (if indicated):     Assets:  Communication Skills  Sleep:  Number of Hours: 5.75   Musculoskeletal: Strength & Muscle Tone: within normal limits Gait & Station: normal Patient leans: Right  Current Medications: Current Facility-Administered Medications  Medication Dose Route Frequency Provider Last Rate Last Dose  . acetaminophen (TYLENOL) tablet 650 mg  650 mg Oral Q6H PRN Benjamine Mola, FNP   650 mg at 07/29/14 2009  . benztropine (COGENTIN) tablet 3 mg  3 mg Oral Daily Saramma Eappen, MD   3 mg at 07/30/14 0732  . haloperidol (HALDOL) tablet 5 mg  5 mg Oral QAC breakfast Ursula Alert, MD   5 mg at 07/30/14 0640  . ibuprofen (ADVIL,MOTRIN) tablet 600 mg  600 mg Oral Q4H PRN Janett Labella, NP   600 mg at 07/29/14 2126  . magnesium hydroxide (MILK OF MAGNESIA) suspension 30 mL  30 mL Oral Daily PRN Benjamine Mola, FNP      . nicotine (NICODERM CQ - dosed in mg/24 hours) patch 21 mg  21 mg Transdermal Daily Benjamine Mola, FNP   21 mg at 07/29/14 0818  . QUEtiapine (SEROQUEL XR) 24 hr tablet 50 mg  50 mg Oral QHS Laverle Hobby, PA-C   50 mg at 07/29/14 2114    Lab Results:  Results for orders placed or performed during the hospital encounter of 07/28/14 (from the past 48  hour(s))  CBC with Differential     Status: Abnormal   Collection Time: 07/29/14  6:18 AM  Result Value Ref Range   WBC 14.5 (H) 4.0 - 10.5 K/uL   RBC 5.46 4.22 - 5.81 MIL/uL   Hemoglobin 17.4 (H) 13.0 - 17.0 g/dL   HCT 47.4 39.0 - 52.0 %   MCV 86.8 78.0 - 100.0 fL   MCH 31.9 26.0 - 34.0 pg   MCHC 36.7 (H) 30.0 - 36.0 g/dL   RDW 13.7 11.5 - 15.5 %   Platelets 304 150 - 400 K/uL   Neutrophils Relative % 73 43 - 77 %   Lymphocytes Relative 16 12 - 46 %   Monocytes Relative 11 3 - 12 %   Eosinophils Relative 0 0 - 5 %   Basophils Relative 0 0 - 1 %   Band Neutrophils 0 0 - 10 %   Metamyelocytes Relative 0 %   Myelocytes 0 %   Promyelocytes Absolute 0 %    Blasts 0 %   nRBC 0 0 /100 WBC   Neutro Abs 10.6 (H) 1.7 - 7.7 K/uL   Lymphs Abs 2.3 0.7 - 4.0 K/uL   Monocytes Absolute 1.6 (H) 0.1 - 1.0 K/uL   Eosinophils Absolute 0.0 0.0 - 0.7 K/uL   Basophils Absolute 0.0 0.0 - 0.1 K/uL    Comment: Performed at [facility]  Influenza panel by PCR (type A & B, H1N1)     Status: None   Collection Time: 07/29/14 12:09 PM  Result Value Ref Range   Influenza A By PCR NEGATIVE NEGATIVE   Influenza B By PCR NEGATIVE NEGATIVE   H1N1 flu by pcr NOT DETECTED NOT DETECTED    Comment:        The Xpert Flu assay (FDA approved for nasal aspirates or washes and nasopharyngeal swab specimens), is intended as an aid in the diagnosis of influenza and should not be used as a sole basis for treatment. Performed at [facility]     Physical Findings: AIMS: Facial and Oral Movements Muscles of Facial Expression: None, normal Lips and Perioral Area: None, normal Jaw: None, normal Tongue: None, normal,Extremity Movements Upper (arms, wrists, hands, fingers): None, normal Lower (legs, knees, ankles, toes): None, normal, Trunk Movements Neck, shoulders, hips: None, normal, Overall Severity Severity of abnormal movements (highest score from questions above): None, normal Incapacitation due to abnormal movements: None, normal Patient's awareness of abnormal movements (rate only patient's report): No Awareness, Dental Status Current problems with teeth and/or dentures?: No Does patient usually wear dentures?: No  CIWA:  CIWA-Ar Total: 0 COWS:  COWS Total Score: 0  Treatment Plan Summary: 1  crisis management and stabilization. 2.  Medication management to reduce symptoms to baseline and improved the patient's overall level of functioning.  Closely monitor the side effects, efficacy and therapeutic response of medication.  Increase Haldol 5 mg twice a day and discontinue Seroquel at bedtime.  He is showing improvement from the past. 3.   Treat health problem as indicated. 4.  Developed treatment plan to decrease the risk of relapse upon discharge and to reduce the need for readmission. 5.  Psychosocial education regarding relapse prevention in self-care. 6.  Healthcare followup as needed for medical problems and called consults as indicated.   7.  Increase collateral information. 8.  Restart home medication where appropriate 9. Encouraged to participate and verbalize into group milieu therapy.  Medical Decision Making Problem Points:  Established problem,  stable/improving (1), New problem, with additional work-up planned (4) and Review of last therapy session (1) Data Points:  Decision to obtain old records (1) Review or order clinical lab tests (1) Review of medication regiment & side effects (2) Review of new medications or change in dosage (2)  I certify that inpatient services furnished can reasonably be expected to improve the patient's condition.   Stepan Verrette T. 07/30/2014, 9:53 AM

## 2014-07-30 NOTE — Progress Notes (Signed)
[facility] Adult Case Management Discharge Plan :  Will you be returning to the same living situation after discharge: Yes,  home At discharge, do you have transportation home?:Yes,  [place] to [facility], mother will pick up there Do you have the ability to pay for your medications:Yes,  MCD  Release of information consent forms completed and in the chart;  Patient's signature needed at discharge.  Patient to Follow up at: Follow-up Information    Follow up with [facility].   Why:  Call them for an appointment.  They were closed on Friday when I called them    Contact information:   [address] [place]  [336] [phone]      Patient denies SI/HI:   Yes,  yes    Safety Planning and Suicide Prevention discussed:  Yes,  yes  Trish Mage 07/30/2014, 12:22 PM

## 2014-07-30 NOTE — Progress Notes (Signed)
[facility] Group Notes:  (Nursing/MHT/Case Management/Adjunct)  Date:  07/30/2014  Time:  9:22 PM  Type of Therapy:  Psychoeducational Skills  Participation Level:  Minimal  Participation Quality:  Attentive  Affect:  Blunted  Cognitive:  Appropriate  Insight:  Appropriate  Engagement in Group:  Limited  Modes of Intervention:  Education  Summary of Progress/Problems: The patient indicated that he had a pretty good day as a whole since the doctor placed him back on his old medication. The patient states that he is beginning to feel better. In terms of the theme for the day, his relapse prevention will include trying to get away from the bad people in his life.   Jeffrey Coleman S 07/30/2014, 9:22 PM

## 2014-07-30 NOTE — BHH Group Notes (Signed)
[facility] LCSW Aftercare Discharge Planning Group Note   07/30/2014 11:57 AM  Participation Quality:  Engaged  Mood/Affect:  Flat  Depression Rating:  Denies  Anxiety Rating:  Denies  Thoughts of Suicide:  No Will you contract for safety?   NA  Current AVH:  No  Plan for Discharge/Comments:  "I stopped my meds a couple of weeks ago because I thought I did not need them.  Then I became suicidal, so I called for help.  I am fine now."  Pt presents with no observable signs nor symptoms of depression nor psychosis.  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    Transportation Means: unk  Supports: mother, [facility] team  Jeffrey Coleman, [place] [facility]

## 2014-07-30 NOTE — BHH Suicide Risk Assessment (Signed)
[place] INPATIENT:  Family/Significant Other Suicide Prevention Education  Suicide Prevention Education:  Education Completed; Olive Motyka, mother, [phone] has been identified by the patient as the family member/significant other with whom the patient will be residing, and identified as the person(s) who will aid the patient in the event of a mental health crisis (suicidal ideations/suicide attempt).  With written consent from the patient, the family member/significant other has been provided the following suicide prevention education, prior to the and/or following the discharge of the patient.  The suicide prevention education provided includes the following:  Suicide risk factors  Suicide prevention and interventions  National Suicide Hotline telephone number  [facility] assessment telephone number  [facility] Emergency Assistance [place] and/or Residential Mobile Crisis Unit telephone number  Request made of family/significant other to:  Remove weapons (e.g., guns, rifles, knives), all items previously/currently identified as safety concern.    Remove drugs/medications (over-the-counter, prescriptions, illicit drugs), all items previously/currently identified as a safety concern.  The family member/significant other verbalizes understanding of the suicide prevention education information provided.  The family member/significant other agrees to remove the items of safety concern listed above. I made sure she had the number for the ACT team when she stated she did not know who to get hold of them.  [name] 07/30/2014, 12:06 PM

## 2014-07-30 NOTE — Tx Team (Signed)
  Interdisciplinary Treatment Plan Update   Date Reviewed:  07/30/2014  Time Reviewed:  12:02 PM  Progress in Treatment:   Attending groups: Yes Participating in groups: Yes Taking medication as prescribed: Yes  Tolerating medication: Yes Family/Significant other contact made: Yes Mother Patient understands diagnosis: Yes AEB asking for help restarting meds Discussing patient identified problems/goals with staff: Yes  See initial care plan Medical problems stabilized or resolved: Yes Denies suicidal/homicidal ideation: Yes  In tx team Patient has not harmed self or others: Yes  For review of initial/current patient goals, please see plan of care.  Estimated Length of Stay:  Likely d/c tomorrow  Reason for Continuation of Hospitalization:   New Problems/Goals identified:  N/A  Discharge Plan or Barriers:   return home, follow up [place] team  Additional Comments:  Pt's meds were restarted and he is stabilizing nicely.  Likely d/c tomorrow.  Attendees:  SignatureAdele Schilder, MD 07/30/2014 12:02 PM   Signature: Ripley Fraise, LCSW 07/30/2014 12:02 PM  Signature:  07/30/2014 12:02 PM  Signature: Lawernce Pitts, RN 07/30/2014 12:02 PM  Signature:  07/30/2014 12:02 PM  Signature:  07/30/2014 12:02 PM  Signature:   07/30/2014 12:02 PM  Signature:    Signature:    Signature:    Signature:    Signature:    Signature:      Scribe for Treatment Team:   Ripley Fraise, [place]  07/30/2014 12:02 PM

## 2014-07-30 NOTE — Progress Notes (Signed)
Patient ID: Jeffrey Coleman, male   DOB: [birth year], 33 y.o.   MRN: [id number]   D: Pt has been appropriate on the unit today, he reported that now that he is back on his medication he is ready for discharge. Pt reported that he was stupid and that one day he woke up and decided that he did not need his medication anymore, so he stopped taking it. Pt reported that that was a big mistake, and that he will never do that again. Pt sign a 72 hour request for discharge to ensure that he could be discharged home, so that he could get to his child. Pt reported his depression as a 0, and his helplessness/hopelessness as a 0. Pt reported being negative SI/HI, no AH/VH noted. A: 15 min checks continued for patient safety. R: Pt safety maintained.

## 2014-07-31 MED ORDER — HALOPERIDOL 5 MG PO TABS: 5.0000 mg | ORAL_TABLET | Freq: Two times a day (BID) | ORAL | Status: DC

## 2014-07-31 MED ORDER — BENZTROPINE MESYLATE 1 MG PO TABS: 1.0000 mg | ORAL_TABLET | Freq: Two times a day (BID) | ORAL | Status: DC

## 2014-07-31 NOTE — BHH Group Notes (Signed)
.  [place] LCSW Group Therapy Note  07/31/2014 11:15 AM  Type of Therapy and Topic:  Group Therapy: Avoiding Self-Sabotaging and Enabling Behaviors  Participation Level:  Active  Mood: Appropriate, attentive  Description of Group:     Learn how to identify obstacles, self-sabotaging and enabling behaviors, what are they, why do we do them and what needs do these behaviors meet? Discuss unhealthy relationships and how to have positive healthy boundaries with those that sabotage and enable. Explore aspects of self-sabotage and enabling in yourself and how to limit these self-destructive behaviors in everyday life. A scaling question is used to help patient look at where they are now in their motivation to change, from 1 to 10 (lowest to highest motivation).  Therapeutic Goals: 1. Patient will identify one obstacle that relates to self-sabotage and enabling behaviors 2. Patient will identify one personal self-sabotaging or enabling behavior they did prior to admission 3. Patient able to establish a plan to change the above identified behavior they did prior to admission:  4. Patient will demonstrate ability to communicate their needs through discussion and/or role plays.   Summary of Patient Progress: The main focus of today's process group was to discuss what "self-sabotage" means and use Motivational Interviewing to discuss what benefits, negative or positive, were involved in a self-identified self-sabotaging behavior. We then talked about reasons the patient may want to change the behavior and her current desire to change. A scaling question was used to help patient look at where they are now in motivation for change, from 1 to 10 (lowest to highest motivation). Patient was somewhat quiet yet appeared engaged as evidenced by his eye contact and appropriate answers when asked direct questions. Kirubel shared that he is looking forward to helping his mother fix up her home and is invested in enjoying  more aspects of his life. He shared that negative self talk and spending time with peers involved in negative behavior are his two main self sabotaging behaviors. He has experienced some success in avoiding negative peers and shared with others "it is not easy but we can do it." He is motivated at an 8 to continue avoiding negative peers.     Therapeutic Modalities:   Cognitive Behavioral Therapy Person-Centered Therapy Motivational Interviewing   Sheilah Pigeon, LCSW

## 2014-07-31 NOTE — BHH Group Notes (Signed)
[facility] Group Notes:  (Nursing/MHT/Case Management/Adjunct)  Date:  07/31/2014  Time:  11:27 AM  Type of Therapy:  Psychoeducational Skills  Participation Level:  Active  Participation Quality:  Appropriate  Affect:  Appropriate  Cognitive:  Appropriate  Insight:  Appropriate  Engagement in Group:  Engaged  Modes of Intervention:  Problem-solving  Summary of Progress/Problems: Pt attended patient self inventory group. Jeffrey Coleman 07/31/2014, 11:27 AM

## 2014-07-31 NOTE — Progress Notes (Signed)
Patient ID: Jeffrey Coleman, male   DOB: 1981-03-09, 33 y.o.   MRN: [id number]  Pt was discharged home, his discharge plan was to have [facility] to transport patient to [place] where his mother and aunt will pick him up. Pt reported that at time of discharge that he was ready for discharge and just wanted to get to his daughter. Pt reported being negative SI/HI, no AH/VH noted.

## 2014-07-31 NOTE — Progress Notes (Signed)
[facility] Adult Case Management Discharge Plan :  Will you be returning to the same living situation after discharge: Yes,  home with family At discharge, do you have transportation home?:Yes,  [facility] providing transportation to [facility]; family to pick up from there Do you have the ability to pay for your medications:Yes,  Pt provided with 30-day prescription  Release of information consent forms completed and in the chart;  Patient's signature needed at discharge.  Patient to Follow up at: Follow-up Information    Follow up with [facility].   Why:  Call them for an appointment.  They were closed on Friday when I called them    Contact information:   [place] [phone]      Follow up with [facility]    .   Why:  Please follow-up here for medical services; you will need to be seen for your low-grade fever   Contact information:   [address] [place] [address] [phone]      Patient denies SI/HI:   Yes,  Pt denies    Safety Planning and Suicide Prevention discussed:  Yes,  with mother; see suicide prevention education note for further detail  Bo Mcclintock 07/31/2014, 12:50 PM

## 2014-07-31 NOTE — BHH Group Notes (Signed)
[facility] Group Notes:  (Nursing/MHT/Case Management/Adjunct)  Date:  07/31/2014  Time:  12:29 PM  Type of Therapy:  Psychoeducational Skills  Participation Level:  Active  Participation Quality:  Appropriate  Affect:  Appropriate  Cognitive:  Appropriate  Insight:  Appropriate  Engagement in Group:  Engaged  Modes of Intervention:  Problem-solving  Summary of Progress/Problems: Pt attended healthy coping skills group.   Benancio Deeds Shanta 07/31/2014, 12:29 PM

## 2014-07-31 NOTE — BHH Suicide Risk Assessment (Signed)
   Demographic Factors:  Male, Low socioeconomic status and Unemployed  Total Time spent with patient: 30 minutes  Psychiatric Specialty Exam: Physical Exam  ROS  Blood pressure 117/79, pulse 101, temperature 99.5 F (37.5 C), temperature source Oral, resp. rate 16, height 5' 3.25" (1.607 m), weight 59.875 kg (132 lb).Body mass index is 23.19 kg/(m^2).  General Appearance: Casual  Eye Contact::  Good  Speech:  Normal Rate  Volume:  Normal  Mood:  Anxious  Affect:  Congruent  Thought Process:  Logical  Orientation:  Full (Time, Place, and Person)  Thought Content:  WDL  Suicidal Thoughts:  No  Homicidal Thoughts:  No  Memory:  Immediate;   Fair Recent;   Fair Remote;   Fair  Judgement:  Intact  Insight:  Good  Psychomotor Activity:  Normal  Concentration:  Fair  Recall:  [employer] [place]  Language: Fair  Akathisia:  No  Handed:  Right  AIMS (if indicated):     Assets:  Communication Skills Desire for Improvement Housing Social Support  Sleep:  Number of Hours: 5.75    Musculoskeletal: Strength & Muscle Tone: within normal limits Gait & Station: normal Patient leans: N/A   Mental Status Per Nursing Assessment::   On Admission:  Self-harm thoughts  Current Mental Status by Physician: NA  Loss Factors: NA  Historical Factors: History of noncompliance with medication  Risk Reduction Factors:   Sense of responsibility to family, Religious beliefs about death, Living with another person, especially a relative, Positive social support, Positive therapeutic relationship and Positive coping skills or problem solving skills  Continued Clinical Symptoms:  Previous Psychiatric Diagnoses and Treatments  Cognitive Features That Contribute To Risk:  Closed-mindedness    Suicide Risk:  Minimal: No identifiable suicidal ideation.  Patients presenting with no risk factors but with morbid ruminations; may be classified as minimal risk based on the  severity of the depressive symptoms  Discharge Diagnoses:   AXIS I:  Paranoid schizophrenia AXIS II:  Deferred AXIS III:   Past Medical History  Diagnosis Date  . Schizo affective schizophrenia    AXIS IV:  other psychosocial or environmental problems and problems with primary support group AXIS V:  61-70 mild symptoms  Plan Of Care/Follow-up recommendations:  Activity:  As tolerated Diet:  Unchanged from the past Other:  Recommended to see his primary care physician as patient is still has high WBC count and low-grade fever.  Is patient on multiple antipsychotic therapies at discharge:  No   Has Patient had three or more failed trials of antipsychotic monotherapy by history:  No  Recommended Plan for Multiple Antipsychotic Therapies: NA    Pernie Grosso T. 07/31/2014, 9:45 AM

## 2014-07-31 NOTE — Progress Notes (Signed)
CSW scheduled transportation with [facility] at 2pm for Pt to be taken back to [facility] where his  Mother and aunt will pick him up.  CSW spoke with Pt's aunt who reported she and Pt's mother would be picking Pt up. CSW informed family that Betsy Pries would be at [facility] at 2pm to pick Pt up and would not be back to [facility] until approximately 2:30 or 3:00. Family agreeable.    Peri Maris, [place] 07/31/2014 9:34 AM

## 2014-08-02 MED ORDER — BENZTROPINE MESYLATE 1 MG PO TABS: 1.0000 mg | ORAL_TABLET | Freq: Two times a day (BID) | ORAL | Status: DC

## 2014-08-02 NOTE — Discharge Summary (Signed)
Physician Discharge Summary Note  Patient:  Jeffrey Coleman is an 33 y.o., male MRN:  [id number] DOB:  [birth year] Patient phone:  [phone] (home)  Patient address:   [place] [address],  Total Time spent with patient: 30 minutes  Date of Admission:  07/28/2014 Date of Discharge: 07/31/2014  Reason for Admission:  Hearing voices, suicidal thoughts  Discharge Diagnoses:  Paranoid schizophrenia  Active Problems:   Psychosis   Paranoid schizophrenia   Psychiatric Specialty Exam: Physical Exam  Psychiatric: He has a normal mood and affect. His speech is normal and behavior is normal. Cognition and memory are normal.    Review of Systems  Constitutional: Negative.   HENT: Negative.   Eyes: Negative.   Respiratory: Negative.   Cardiovascular: Negative.   Gastrointestinal: Negative.   Genitourinary: Negative.   Musculoskeletal: Negative.   Skin: Negative.   Neurological: Negative.   Endo/Heme/Allergies: Negative.   Psychiatric/Behavioral: Positive for depression (Hx of, chronic). Negative for suicidal ideas, hallucinations, memory loss and substance abuse. The patient is nervous/anxious (chronic, Hx of). The patient does not have insomnia.     Blood pressure 117/79, pulse 101, temperature 99.5 F (37.5 C), temperature source Oral, resp. rate 16, height 5' 3.25" (1.607 m), weight 59.875 kg (132 lb).Body mass index is 23.19 kg/(m^2).    Musculoskeletal: Strength & Muscle Tone: within normal limits Gait & Station: normal Patient leans: N/A  Past Psychiatric History: Diagnosis: psychosis  Hospitalizations: "in [place], I don't remember the name of the place"  Outpatient Care: ACT  Substance Abuse Care: Did not endorse  Self-Mutilation: History of it, once, a long time ago  Suicidal Attempts: history  Violent Behaviors: History, animal cruelty   Diagnosis AXIS I: Paranoid schizophrenia AXIS II: Deferred AXIS III:  Past Medical  History  Diagnosis Date  . Schizo affective schizophrenia    AXIS IV: other psychosocial or environmental problems and problems with primary support group AXIS V: 61-70 mild symptoms   Level of Care:  OP  Hospital Course:  Jeffrey Coleman is a 33 yo male patient who came in with suicidal ideation/thoughts. He states that he had been off his meds for "mos" and he began to hear voices again to hurt himself. He also states that he has had history of SI/HI (killed 3 dogs) a long time ago.  Patient also had significant labs:  WBC 14.5.  Per nursing, patient was febrile with 102.1, muscle aches.  Flu by PCR ordered and negative results.  Advised to call PCP/or clinic to follow up on elevated WBC.   Negative BAC and negative UDS to note.  His crisis episode was managed with medication.  His moods improved on Haloperidol 5 mg and was given Cogentin 1 mg for drug induced EPS.  Jeffrey Coleman also attended some of the group therapy sessions offered on the unit.  He did not display disruptive behavior.  He interacted well with others.  He was encouraged to adhere to medication regimen, follow up appt's.   He was also advised to follow up with PCP for elevated WBC and fever.  At time of discharge, rated both depression and anxiety levels to be manageable and minimal.  He tolerated his medications well.  Denies physiological concerns/SI/HI/AVH at time of discharge.    Consults:  psychiatry  Significant Diagnostic Studies:  labs: per ED  Discharge Vitals:   Blood pressure 117/79, pulse 101, temperature 99.5 F (37.5 C), temperature source Oral, resp. rate 16, height 5' 3.25" (1.607 m),  weight 59.875 kg (132 lb). Body mass index is 23.19 kg/(m^2). Lab Results:   No results found for this or any previous visit (from the past 72 hour(s)).  Physical Findings: AIMS: Facial and Oral Movements Muscles of Facial Expression: None, normal Lips and Perioral Area: None, normal Jaw: None, normal Tongue: None,  normal,Extremity Movements Upper (arms, wrists, hands, fingers): None, normal Lower (legs, knees, ankles, toes): None, normal, Trunk Movements Neck, shoulders, hips: None, normal, Overall Severity Severity of abnormal movements (highest score from questions above): None, normal Incapacitation due to abnormal movements: None, normal Patient's awareness of abnormal movements (rate only patient's report): No Awareness, Dental Status Current problems with teeth and/or dentures?: No Does patient usually wear dentures?: No  CIWA:  CIWA-Ar Total: 0 COWS:  COWS Total Score: 0  Psychiatric Specialty Exam: See Psychiatric Specialty Exam and Suicide Risk Assessment completed by Attending Physician prior to discharge.  Discharge destination:  Home  Is patient on multiple antipsychotic therapies at discharge:  No   Has Patient had three or more failed trials of antipsychotic monotherapy by history:  No  Recommended Plan for Multiple Antipsychotic Therapies: NA     Medication List    STOP taking these medications        multivitamin with minerals Tabs tablet     OLANZapine 5 MG tablet  Commonly known as:  ZYPREXA      TAKE these medications      Indication   benztropine 1 MG tablet  Commonly known as:  COGENTIN  Take 1 tablet (1 mg total) by mouth 2 (two) times daily.   Indication:  Extrapyramidal Reaction caused by Medications     haloperidol 5 MG tablet  Commonly known as:  HALDOL  Take 1 tablet (5 mg total) by mouth 2 (two) times daily.   Indication:  Psychosis, Mood Stabilization       Follow-up Information    Follow up with [facility].   Why:  Call them for an appointment.  They were closed on Friday when I called them    Contact information:   [place] [phone]      Follow up with [facility]    .   Why:  Please follow-up here for medical services; you will need to be seen for your low-grade fever   Contact  information:   [address] [place] [address] [phone]      Follow-up recommendations:  Activity:  as tol, diet as tol  Comments:  1.  Take all your medications as prescribed.              2.  Report any adverse side effects to outpatient provider.                       3.  Patient instructed to not use alcohol or illegal drugs while on prescription medicines.            4.  In the event of worsening symptoms, instructed patient to call 911, the crisis hotline or go to nearest emergency room for evaluation of symptoms.  Total Discharge Time:  Greater than 30 minutes.  SignedKerrie Buffalo MAY, AGNP-BC 08/02/2014, 1:49 PM   I have personally seen the patient and agreed with the findings and involved in the treatment plan. Berniece Andreas, MD

## 2014-08-03 NOTE — Progress Notes (Signed)
Patient Discharge Instructions:  After Visit Summary (AVS):   Faxed to:  08/03/14 Discharge Summary Note:   Faxed to:  08/03/14 Psychiatric Admission Assessment Note:   Faxed to:  08/03/14 Suicide Risk Assessment - Discharge Assessment:   Faxed to:  08/03/14 Faxed/Sent to the Next Level Care provider:  08/03/14 Next Level Care Provider Has Access to the EMR, 08/03/14  Faxed to [facility] @ [phone] Records provided to [facility] via CHL/Epic access.   Patsey Berthold, 08/03/2014, 3:12 PM

## 2014-08-15 ENCOUNTER — Inpatient Hospital Stay (HOSPITAL_COMMUNITY)
Admission: EM | Admit: 2014-08-15 | Discharge: 2014-08-23 | DRG: 871 | Disposition: A | Discharge status: Home / Self Care | Payer: Medicare Other | Attending: Family Medicine | Admitting: Family Medicine

## 2014-08-15 ENCOUNTER — Encounter (HOSPITAL_COMMUNITY): Payer: Self-pay | Admitting: *Deleted

## 2014-08-15 DIAGNOSIS — F17200 Nicotine dependence, unspecified, uncomplicated: Secondary | ICD-10-CM

## 2014-08-15 DIAGNOSIS — J851 Abscess of lung with pneumonia: Secondary | ICD-10-CM | POA: Diagnosis present

## 2014-08-15 DIAGNOSIS — R059 Cough, unspecified: Secondary | ICD-10-CM

## 2014-08-15 DIAGNOSIS — F200 Paranoid schizophrenia: Secondary | ICD-10-CM | POA: Diagnosis present

## 2014-08-15 DIAGNOSIS — J984 Other disorders of lung: Secondary | ICD-10-CM

## 2014-08-15 DIAGNOSIS — A419 Sepsis, unspecified organism: Principal | ICD-10-CM | POA: Diagnosis present

## 2014-08-15 DIAGNOSIS — R05 Cough: Secondary | ICD-10-CM

## 2014-08-15 DIAGNOSIS — J189 Pneumonia, unspecified organism: Secondary | ICD-10-CM | POA: Diagnosis present

## 2014-08-15 DIAGNOSIS — Z79899 Other long term (current) drug therapy: Secondary | ICD-10-CM

## 2014-08-15 DIAGNOSIS — F17210 Nicotine dependence, cigarettes, uncomplicated: Secondary | ICD-10-CM | POA: Diagnosis present

## 2014-08-15 DIAGNOSIS — D473 Essential (hemorrhagic) thrombocythemia: Secondary | ICD-10-CM | POA: Diagnosis present

## 2014-08-15 DIAGNOSIS — E876 Hypokalemia: Secondary | ICD-10-CM | POA: Diagnosis present

## 2014-08-15 DIAGNOSIS — F259 Schizoaffective disorder, unspecified: Secondary | ICD-10-CM | POA: Diagnosis present

## 2014-08-15 DIAGNOSIS — E871 Hypo-osmolality and hyponatremia: Secondary | ICD-10-CM | POA: Diagnosis present

## 2014-08-15 DIAGNOSIS — R509 Fever, unspecified: Secondary | ICD-10-CM

## 2014-08-15 NOTE — ED Notes (Addendum)
Pt was seen at [facility] and was told the was something was abnormal on the right lung. Pt c/o chest pain when he breathes and coughs. Decreased appetite. Pt ws given antibiotics on Monday.

## 2014-08-16 ENCOUNTER — Emergency Department (HOSPITAL_COMMUNITY): Payer: Medicare Other

## 2014-08-16 DIAGNOSIS — J851 Abscess of lung with pneumonia: Secondary | ICD-10-CM | POA: Diagnosis present

## 2014-08-16 DIAGNOSIS — F200 Paranoid schizophrenia: Secondary | ICD-10-CM

## 2014-08-16 DIAGNOSIS — E876 Hypokalemia: Secondary | ICD-10-CM | POA: Diagnosis present

## 2014-08-16 DIAGNOSIS — F17210 Nicotine dependence, cigarettes, uncomplicated: Secondary | ICD-10-CM | POA: Diagnosis present

## 2014-08-16 DIAGNOSIS — D473 Essential (hemorrhagic) thrombocythemia: Secondary | ICD-10-CM | POA: Diagnosis present

## 2014-08-16 DIAGNOSIS — Z79899 Other long term (current) drug therapy: Secondary | ICD-10-CM | POA: Diagnosis not present

## 2014-08-16 DIAGNOSIS — J189 Pneumonia, unspecified organism: Secondary | ICD-10-CM | POA: Diagnosis present

## 2014-08-16 DIAGNOSIS — F259 Schizoaffective disorder, unspecified: Secondary | ICD-10-CM | POA: Diagnosis present

## 2014-08-16 DIAGNOSIS — E871 Hypo-osmolality and hyponatremia: Secondary | ICD-10-CM | POA: Diagnosis present

## 2014-08-16 DIAGNOSIS — R0602 Shortness of breath: Secondary | ICD-10-CM

## 2014-08-16 DIAGNOSIS — R509 Fever, unspecified: Secondary | ICD-10-CM

## 2014-08-16 DIAGNOSIS — R05 Cough: Secondary | ICD-10-CM

## 2014-08-16 DIAGNOSIS — A419 Sepsis, unspecified organism: Secondary | ICD-10-CM | POA: Diagnosis present

## 2014-08-16 LAB — COMPREHENSIVE METABOLIC PANEL
ALT: 17 U/L (ref 0–53)
ANION GAP: 15 (ref 5–15)
AST: 17 U/L (ref 0–37)
Albumin: 2.8 g/dL — ABNORMAL LOW (ref 3.5–5.2)
Alkaline Phosphatase: 76 U/L (ref 39–117)
BUN: 5 mg/dL — AB (ref 6–23)
CALCIUM: 9.1 mg/dL (ref 8.4–10.5)
CO2: 25 meq/L (ref 19–32)
CREATININE: 0.9 mg/dL (ref 0.50–1.35)
Chloride: 94 mEq/L — ABNORMAL LOW (ref 96–112)
GLUCOSE: 106 mg/dL — AB (ref 70–99)
Potassium: 3.4 mEq/L — ABNORMAL LOW (ref 3.7–5.3)
SODIUM: 134 meq/L — AB (ref 137–147)
TOTAL PROTEIN: 8.4 g/dL — AB (ref 6.0–8.3)
Total Bilirubin: 0.5 mg/dL (ref 0.3–1.2)

## 2014-08-16 LAB — CBC WITH DIFFERENTIAL/PLATELET
BASOS PCT: 0 % (ref 0–1)
Basophils Absolute: 0 10*3/uL (ref 0.0–0.1)
Basophils Absolute: 0.1 10*3/uL (ref 0.0–0.1)
Basophils Relative: 0 % (ref 0–1)
EOS ABS: 0 10*3/uL (ref 0.0–0.7)
Eosinophils Absolute: 0 10*3/uL (ref 0.0–0.7)
Eosinophils Relative: 0 % (ref 0–5)
Eosinophils Relative: 0 % (ref 0–5)
HCT: 40.1 % (ref 39.0–52.0)
HEMATOCRIT: 38.3 % — AB (ref 39.0–52.0)
HEMOGLOBIN: 13.9 g/dL (ref 13.0–17.0)
Hemoglobin: 14.3 g/dL (ref 13.0–17.0)
LYMPHS ABS: 2.3 10*3/uL (ref 0.7–4.0)
LYMPHS ABS: 3 10*3/uL (ref 0.7–4.0)
Lymphocytes Relative: 13 % (ref 12–46)
Lymphocytes Relative: 18 % (ref 12–46)
MCH: 30.7 pg (ref 26.0–34.0)
MCH: 31 pg (ref 26.0–34.0)
MCHC: 35.7 g/dL (ref 30.0–36.0)
MCHC: 36.3 g/dL — ABNORMAL HIGH (ref 30.0–36.0)
MCV: 85.5 fL (ref 78.0–100.0)
MCV: 86.1 fL (ref 78.0–100.0)
MONO ABS: 1.6 10*3/uL — AB (ref 0.1–1.0)
MONOS PCT: 9 % (ref 3–12)
Monocytes Absolute: 1.2 10*3/uL — ABNORMAL HIGH (ref 0.1–1.0)
Monocytes Relative: 7 % (ref 3–12)
NEUTROS ABS: 12.6 10*3/uL — AB (ref 1.7–7.7)
NEUTROS PCT: 75 % (ref 43–77)
Neutro Abs: 14.2 10*3/uL — ABNORMAL HIGH (ref 1.7–7.7)
Neutrophils Relative %: 78 % — ABNORMAL HIGH (ref 43–77)
PLATELETS: 386 10*3/uL (ref 150–400)
Platelets: 401 10*3/uL — ABNORMAL HIGH (ref 150–400)
RBC: 4.48 MIL/uL (ref 4.22–5.81)
RBC: 4.66 MIL/uL (ref 4.22–5.81)
RDW: 13.2 % (ref 11.5–15.5)
RDW: 13.2 % (ref 11.5–15.5)
WBC: 16.8 10*3/uL — ABNORMAL HIGH (ref 4.0–10.5)
WBC: 18.1 10*3/uL — ABNORMAL HIGH (ref 4.0–10.5)

## 2014-08-16 LAB — BASIC METABOLIC PANEL
Anion gap: 11 (ref 5–15)
BUN: 4 mg/dL — AB (ref 6–23)
CHLORIDE: 101 meq/L (ref 96–112)
CO2: 24 mEq/L (ref 19–32)
CREATININE: 0.79 mg/dL (ref 0.50–1.35)
Calcium: 8.7 mg/dL (ref 8.4–10.5)
GFR calc non Af Amer: >90 mL/min (ref >90)
GLUCOSE: 116 mg/dL — AB (ref 70–99)
Potassium: 3.8 mEq/L (ref 3.7–5.3)
Sodium: 136 mEq/L — ABNORMAL LOW (ref 137–147)

## 2014-08-16 LAB — LACTIC ACID, PLASMA: LACTIC ACID, VENOUS: 1.2 mmol/L (ref 0.5–2.2)

## 2014-08-16 LAB — STREP PNEUMONIAE URINARY ANTIGEN: STREP PNEUMO URINARY ANTIGEN: NEGATIVE

## 2014-08-16 LAB — HIV ANTIBODY (ROUTINE TESTING W REFLEX): HIV 1&2 Ab, 4th Generation: NONREACTIVE

## 2014-08-16 MED ORDER — DEXTROSE 5 % IV SOLN
1.0000 g | Freq: Once | INTRAVENOUS | Status: AC
  Administered 2014-08-16: 1 g via INTRAVENOUS
  Filled 2014-08-16: qty 10

## 2014-08-16 MED ORDER — SODIUM CHLORIDE 0.9 % IV SOLN
1000.0000 mL | Freq: Once | INTRAVENOUS | Status: AC
  Administered 2014-08-16: 1000 mL via INTRAVENOUS

## 2014-08-16 MED ORDER — DEXTROSE 5 % IV SOLN
500.0000 mg | Freq: Once | INTRAVENOUS | Status: AC
  Administered 2014-08-16: 500 mg via INTRAVENOUS
  Filled 2014-08-16: qty 500

## 2014-08-16 MED ORDER — ENOXAPARIN SODIUM 40 MG/0.4ML ~~LOC~~ SOLN
40.0000 mg | SUBCUTANEOUS | Status: DC
  Administered 2014-08-16 – 2014-08-17 (×2): 40 mg via SUBCUTANEOUS
  Filled 2014-08-16: qty 0.4

## 2014-08-16 MED ORDER — HYDROCODONE-ACETAMINOPHEN 5-325 MG PO TABS
1.0000 | ORAL_TABLET | ORAL | Status: DC | PRN
  Administered 2014-08-16 – 2014-08-22 (×17): 1 via ORAL
  Filled 2014-08-16 (×17): qty 1

## 2014-08-16 MED ORDER — VANCOMYCIN HCL IN DEXTROSE 1-5 GM/200ML-% IV SOLN
INTRAVENOUS | Status: AC
  Filled 2014-08-16: qty 200

## 2014-08-16 MED ORDER — CETYLPYRIDINIUM CHLORIDE 0.05 % MT LIQD
7.0000 mL | Freq: Two times a day (BID) | OROMUCOSAL | Status: DC
  Administered 2014-08-16 – 2014-08-23 (×12): 7 mL via OROMUCOSAL

## 2014-08-16 MED ORDER — POTASSIUM CHLORIDE IN NACL 20-0.9 MEQ/L-% IV SOLN
INTRAVENOUS | Status: DC
  Administered 2014-08-16: 06:00:00 via INTRAVENOUS

## 2014-08-16 MED ORDER — ALBUTEROL SULFATE (2.5 MG/3ML) 0.083% IN NEBU: 2.5000 mg | INHALATION_SOLUTION | RESPIRATORY_TRACT | Status: DC | PRN

## 2014-08-16 MED ORDER — PNEUMOCOCCAL VAC POLYVALENT 25 MCG/0.5ML IJ INJ
0.5000 mL | INJECTION | INTRAMUSCULAR | Status: DC
  Filled 2014-08-16: qty 0.5

## 2014-08-16 MED ORDER — VANCOMYCIN HCL IN DEXTROSE 750-5 MG/150ML-% IV SOLN
750.0000 mg | Freq: Two times a day (BID) | INTRAVENOUS | Status: DC
  Administered 2014-08-16 – 2014-08-20 (×8): 750 mg via INTRAVENOUS
  Filled 2014-08-16 (×14): qty 150

## 2014-08-16 MED ORDER — ARIPIPRAZOLE 10 MG PO TABS
15.0000 mg | ORAL_TABLET | Freq: Every day | ORAL | Status: DC
Start: 2014-08-16 — End: 2014-08-23
  Administered 2014-08-16 – 2014-08-23 (×8): 15 mg via ORAL
  Filled 2014-08-16 (×8): qty 2

## 2014-08-16 MED ORDER — BENZONATATE 100 MG PO CAPS
100.0000 mg | ORAL_CAPSULE | Freq: Three times a day (TID) | ORAL | Status: DC
  Administered 2014-08-16 – 2014-08-23 (×22): 100 mg via ORAL
  Filled 2014-08-16 (×22): qty 1

## 2014-08-16 MED ORDER — VANCOMYCIN HCL IN DEXTROSE 1-5 GM/200ML-% IV SOLN
1000.0000 mg | Freq: Once | INTRAVENOUS | Status: AC
  Administered 2014-08-16: 1000 mg via INTRAVENOUS

## 2014-08-16 MED ORDER — IPRATROPIUM-ALBUTEROL 0.5-2.5 (3) MG/3ML IN SOLN
3.0000 mL | Freq: Once | RESPIRATORY_TRACT | Status: AC
  Administered 2014-08-16: 3 mL via RESPIRATORY_TRACT
  Filled 2014-08-16: qty 3

## 2014-08-16 MED ORDER — INFLUENZA VAC SPLIT QUAD 0.5 ML IM SUSY
0.5000 mL | PREFILLED_SYRINGE | INTRAMUSCULAR | Status: DC
  Filled 2014-08-16: qty 0.5

## 2014-08-16 MED ORDER — SODIUM CHLORIDE 0.9 % IV SOLN: 1000.0000 mL | INTRAVENOUS | Status: DC

## 2014-08-16 MED ORDER — DEXTROSE 5 % IV SOLN
500.0000 mg | INTRAVENOUS | Status: DC
  Administered 2014-08-16 – 2014-08-21 (×6): 500 mg via INTRAVENOUS
  Filled 2014-08-16 (×7): qty 500

## 2014-08-16 MED ORDER — SODIUM CHLORIDE 0.9 % IV SOLN
1000.0000 mL | Freq: Once | INTRAVENOUS | Status: DC
Start: 2014-08-16 — End: 2014-08-16

## 2014-08-16 MED ORDER — CEFTRIAXONE SODIUM IN DEXTROSE 20 MG/ML IV SOLN
1.0000 g | INTRAVENOUS | Status: AC
  Administered 2014-08-16 – 2014-08-22 (×7): 1 g via INTRAVENOUS
  Filled 2014-08-16 (×7): qty 50

## 2014-08-16 NOTE — Care Management Note (Addendum)
    Page 1 of 1   08/23/2014     2:23:38 PM CARE MANAGEMENT NOTE 08/23/2014  Patient:  Jeffrey Coleman, Jeffrey Coleman   Account Number:  [id number]  Date Initiated:  08/16/2014  Documentation initiated by:  Theophilus Kinds  Subjective/Objective Assessment:   Pt admitted from home with pneumonia. Pt lives with his mother and will return home at discharge. Pt is independent with ADL's. Pt goes to [facility].     Action/Plan:   No CM needs noted.   Anticipated DC Date:  08/18/2014   Anticipated DC Plan:  [facility]  CM consult      Choice offered to / List presented to:             Status of service:  Completed, signed off Medicare Important Message given?  YES (If response is "NO", the following Medicare IM given date fields will be blank) Date Medicare IM given:  08/20/2014 Medicare IM given by:  Christinia Gully C Date Additional Medicare IM given:  08/23/2014 Additional Medicare IM given by:  Theophilus Kinds  Discharge Disposition:  HOME/SELF CARE  Per UR Regulation:    If discussed at Long Length of Stay Meetings, dates discussed:    Comments:  08/23/14 [place], RN BSN CM Pt discharged home today. No Cm needs noted.  08/20/14 1400 Christinia Gully, RN BSN CM Awaiting results of TB X3 sptums. Once all collected and resulted, if negative, pt will discharge home. No Cm needs noted.  08/16/14 [place], RN BSN CM

## 2014-08-16 NOTE — Progress Notes (Signed)
UR chart review completed.  

## 2014-08-16 NOTE — Progress Notes (Signed)
ANTIBIOTIC CONSULT NOTE - INITIAL  Pharmacy Consult for vancomycin Indication: pneumonia  No Known Allergies  Patient Measurements: Height: 5\' 4"  (162.6 cm) Weight: 130 lb (58.968 kg) IBW/kg (Calculated) : 59.2 Actual body weight equal to IBW  Vital Signs: Temp: 100.8 F (38.2 C) (12/13 2340) Temp Source: Oral (12/13 2340) BP: 125/90 mmHg (12/13 2340) Pulse Rate: 96 (12/13 2340) Intake/Output from previous day:   Intake/Output from this shift:    Labs:  Recent Labs  08/16/14 0014  WBC 16.8*  HGB 14.3  PLT 386  CREATININE 0.90   Estimated Creatinine Clearance: 97.4 mL/min (by C-G formula based on Cr of 0.9).  Because patient is very lean, and because he has a decreased serum albumin production, his kinetic parameters will probably not follow normal population values.  Will assume an approximate CrCl 75-48ml/min. No results for input(s): VANCOTROUGH, VANCOPEAK, VANCORANDOM, GENTTROUGH, GENTPEAK, GENTRANDOM, TOBRATROUGH, TOBRAPEAK, TOBRARND, AMIKACINPEAK, AMIKACINTROU, AMIKACIN in the last 72 hours.   Microbiology: No results found for this or any previous visit (from the past 720 hour(s)).  Medical History: Past Medical History  Diagnosis Date  . Schizo affective schizophrenia     Medications:  Scheduled:  . vancomycin  750 mg Intravenous Q12H   Infusions:  . sodium chloride     Followed by  . sodium chloride    . azithromycin (ZITHROMAX) 500 MG IVPB 500 mg (08/16/14 0215)  . vancomycin     PRN:   Assessment: 44yr lean schizophrenic patient with dx of pneumonia.  Patient has received 1 dose azithromycin and is now to be started on vancomycin.  Goal of Therapy:  Desire vancomycin serum torugh level to be 15-39mcg/ml  Plan:  1.  Vancomycin loading dose 1gm IV x 1 now 2.  Start vancomycin maintenance regimen 750mg  IV q12h. 3.  Monitor for indices of infection and renal function 4.  Measure actual serum steady state vancomycin trough level as clinically  indicated  Othello Dickenson E 08/16/2014,2:17 AM

## 2014-08-16 NOTE — Plan of Care (Signed)
Problem: Phase I Progression Outcomes Goal: OOB as tolerated unless otherwise ordered Outcome: Completed/Met Date Met:  08/16/14 Pt Up Ab Lib. Ambulates frequently in room.

## 2014-08-16 NOTE — H&P (Signed)
PCP:   [facility]   Chief Complaint:  fever  HPI: 33 yo male 4 days ago diagnosed with CAP placed on levaquin.  Has taken that for 4 days.  Comes back in because still having chills and fevers at home.  Still cough, nonbloody.  No n/v/d.  No cp.  No swelling.  H/o schizophrenia stable on abilify for over a year.  He was instructed to get tested for hiv a couple of weeks ago which he did, has not gotten the results.  No ivda history.  Sexual history low risk.  Lives with his mom.  Review of Systems:  Positive and negative as per HPI otherwise all other systems are negative  Past Medical History: Past Medical History  Diagnosis Date  . Schizo affective schizophrenia    History reviewed. No pertinent past surgical history.  Medications: Prior to Admission medications   Medication Sig Start Date End Date Taking? Authorizing Provider  ARIPiprazole (ABILIFY) 10 MG tablet Take 15 mg by mouth daily.    Yes Historical Provider, MD  levofloxacin (LEVAQUIN) 750 MG tablet Take 750 mg by mouth daily.   Yes Historical Provider, MD    Allergies:  No Known Allergies  Social History:  reports that he has been smoking Cigarettes.  He has a 7.5 pack-year smoking history. He has never used smokeless tobacco. He reports that he does not drink alcohol or use illicit drugs.  Family History: Family History  Problem Relation Age of Onset  . Cancer Father     Physical Exam: Filed Vitals:   08/15/14 2340 08/16/14 0053  BP: 125/90   Pulse: 96   Temp: 100.8 F (38.2 C)   TempSrc: Oral   Resp: 16   Height: 5\' 4"  (1.626 m)   Weight: 58.968 kg (130 lb)   SpO2: 99% 95%   General appearance: alert, cooperative and no distress Head: Normocephalic, without obvious abnormality, atraumatic Eyes: negative Nose: Nares normal. Septum midline. Mucosa normal. No drainage or sinus tenderness. Neck: no JVD and supple, symmetrical, trachea midline Lungs: clear to auscultation  bilaterally Heart: regular rate and rhythm, S1, S2 normal, no murmur, click, rub or gallop Abdomen: soft, non-tender; bowel sounds normal; no masses,  no organomegaly Extremities: extremities normal, atraumatic, no cyanosis or edema Pulses: 2+ and symmetric Skin: Skin color, texture, turgor normal. No rashes or lesions Neurologic: Grossly normal   Labs on Admission:   Recent Labs  08/16/14 0014  NA 134*  K 3.4*  CL 94*  CO2 25  GLUCOSE 106*  BUN 5*  CREATININE 0.90  CALCIUM 9.1    Recent Labs  08/16/14 0014  AST 17  ALT 17  ALKPHOS 76  BILITOT 0.5  PROT 8.4*  ALBUMIN 2.8*    Recent Labs  08/16/14 0014  WBC 16.8*  NEUTROABS 12.6*  HGB 14.3  HCT 40.1  MCV 86.1  PLT 386   Radiological Exams on Admission: Dg Chest 2 View  08/16/2014   CLINICAL DATA:  Cough, fever.  EXAM: CHEST  2 VIEW  COMPARISON:  August 11, 2014.  FINDINGS: The heart size and mediastinal contours are within normal limits. No pneumothorax or pleural effusion is noted. Left lung is clear. Right upper lobe opacity is significantly increased consistent with worsening pneumonia. The visualized skeletal structures are unremarkable.  IMPRESSION: Worsening right upper lobe pneumonia. Followup radiographs are recommended until resolution.   Electronically Signed   By: Sabino Dick M.D.   On: 08/16/2014 01:18  Assessment/Plan  33 yo male h/o schizophrenia with CAP failing outpatient tx with levaquin  Principal Problem:   PNA (pneumonia)-  pna pathway.  Received azithro and rocephin in ED, will cont these and add vancomycin.  Obtain blood and sputum cultures.  Ck hiv.  Flu was neg last week.  Vss.  Active Problems:   Paranoid schizophrenia-  Cont abilify   SOB (shortness of breath)  Admit to medical.  Full code.    Tashiya Souders A 08/16/2014, 2:08 AM

## 2014-08-16 NOTE — Progress Notes (Signed)
The patient is a 33 year old man who was admitted this morning by Dr. Shanon Brow for worsening right upper lobe pneumonia. He was briefly seen and examined. His vital signs and laboratory studies were reviewed. Agree with current management with additions below:  1. The patient had an outpatient chest CT scan scheduled by his PCP because according to him, there was concern about a malignancy. I see no clinical reason to order the CT scan of his chest currently, but it will certainly be considered if he does not improve clinically. 2. We'll continue antibiotic therapy with vancomycin, azithromycin, and ceftriaxone. Will saline lock his IV fluids as he is receiving fluids with these antibiotics. 3. Will add when necessary albuterol nebulizer. We'll add [employer]. 4. Will follow-up on laboratory studies pending including blood cultures, strep/Legionella antigens, and sputum culture. Also pending is HIV antibody. 5. Influenza panel was negative. 6. He smokes approximately for 5 cigarettes daily. He was encouraged to stop smoking completely.

## 2014-08-16 NOTE — ED Provider Notes (Signed)
CSN: [id number]     Arrival date & time 08/15/14  2336 History  This chart was scribed for Delora Fuel, MD by Lowella Petties, [name]. The patient was seen in room APA06/APA06. Patient's care was started at 12:09 AM.    Chief Complaint  Patient presents with  . Chest Pain   The history is provided by the patient. No language interpreter was used.   HPI Comments: Jeffrey Coleman is a 33 y.o. male who presents to the Emergency Department complaining of throbbing, bilateral, chest pain which he rates as a 9/10 and which began earlier tody. He reports nausea and vomiting today. He reports associated cough, subjective fever, chills, diaphoresis, fatigue and generalized body aches for the past two weeks. He reports taking Nyquil without relief. He reports that he was seen at the [facility] center, diagnosed with pneumonia, and prescribed Levaquin which he has taken for 5 days without relief. He smokes 5-6 cigarettes per day. He reports recently discontinuing use of Haladol and switching to Abilify because it was causing him to "shake."   Past Medical History  Diagnosis Date  . Schizo affective schizophrenia    History reviewed. No pertinent past surgical history. Family History  Problem Relation Age of Onset  . Cancer Father    History  Substance Use Topics  . Smoking status: Current Every Day Smoker -- 0.50 packs/day for 15 years    Types: Cigarettes  . Smokeless tobacco: Never Used  . Alcohol Use: No    Review of Systems  Constitutional: Positive for fever, chills, diaphoresis and fatigue.  Cardiovascular: Positive for chest pain.  Musculoskeletal: Positive for arthralgias (generalized body aches).  All other systems reviewed and are negative.   Allergies  Review of patient's allergies indicates no known allergies.  Home Medications   Prior to Admission medications   Medication Sig Start Date End Date Taking? Authorizing Provider  ARIPiprazole (ABILIFY) 10 MG  tablet Take 15 mg by mouth daily.    Yes Historical Provider, MD  levofloxacin (LEVAQUIN) 750 MG tablet Take 750 mg by mouth daily.   Yes Historical Provider, MD   Triage Vitals: BP 125/90 mmHg  Pulse 96  Temp(Src) 100.8 F (38.2 C) (Oral)  Resp 16  Ht 5\' 4"  (1.626 m)  Wt 130 lb (58.968 kg)  BMI 22.30 kg/m2  SpO2 99% Physical Exam  Constitutional: He is oriented to person, place, and time. He appears well-developed and well-nourished. No distress.  HENT:  Head: Normocephalic and atraumatic.  Eyes: Conjunctivae and EOM are normal. Pupils are equal, round, and reactive to light.  Neck: Normal range of motion. Neck supple. No JVD present. No tracheal deviation present.  Cardiovascular: Normal rate, regular rhythm and normal heart sounds.   No murmur heard. Pulmonary/Chest: Effort normal. No respiratory distress. He has rhonchi.  Slightly prolonged exhalation phase. Few rhonchi in the left base  Abdominal: Soft. Bowel sounds are normal. He exhibits no distension and no mass. There is no tenderness.  Musculoskeletal: Normal range of motion. He exhibits edema.  Lymphadenopathy:    He has cervical adenopathy.  Neurological: He is alert and oriented to person, place, and time. He has normal reflexes. No cranial nerve deficit. He exhibits normal muscle tone. Coordination normal.  Skin: Skin is warm and dry. No rash noted.  Psychiatric: He has a normal mood and affect. His behavior is normal. Judgment and thought content normal.  Nursing note and vitals reviewed.   ED Course  Procedures (including  critical care time) DIAGNOSTIC STUDIES: Oxygen Saturation is 99% on room air, normal by my interpretation.    COORDINATION OF CARE: 12:14 AM-Discussed treatment plan which includes breathing treatment, CXR, and lab work with pt at bedside and pt agreed to plan.   Labs Review Results for orders placed or performed during the hospital encounter of 08/15/14  Comprehensive metabolic panel   Result Value Ref Range   Sodium 134 (L) 137 - 147 mEq/L   Potassium 3.4 (L) 3.7 - 5.3 mEq/L   Chloride 94 (L) 96 - 112 mEq/L   CO2 25 19 - 32 mEq/L   Glucose, Bld 106 (H) 70 - 99 mg/dL   BUN 5 (L) 6 - 23 mg/dL   Creatinine, Ser 0.90 0.50 - 1.35 mg/dL   Calcium 9.1 8.4 - 10.5 mg/dL   Total Protein 8.4 (H) 6.0 - 8.3 g/dL   Albumin 2.8 (L) 3.5 - 5.2 g/dL   AST 17 0 - 37 U/L   ALT 17 0 - 53 U/L   Alkaline Phosphatase 76 39 - 117 U/L   Total Bilirubin 0.5 0.3 - 1.2 mg/dL   GFR calc non Af Amer >90 >90 mL/min   GFR calc Af Amer >90 >90 mL/min   Anion gap 15 5 - 15  CBC with Differential  Result Value Ref Range   WBC 16.8 (H) 4.0 - 10.5 K/uL   RBC 4.66 4.22 - 5.81 MIL/uL   Hemoglobin 14.3 13.0 - 17.0 g/dL   HCT 40.1 39.0 - 52.0 %   MCV 86.1 78.0 - 100.0 fL   MCH 30.7 26.0 - 34.0 pg   MCHC 35.7 30.0 - 36.0 g/dL   RDW 13.2 11.5 - 15.5 %   Platelets 386 150 - 400 K/uL   Neutrophils Relative % 75 43 - 77 %   Neutro Abs 12.6 (H) 1.7 - 7.7 K/uL   Lymphocytes Relative 18 12 - 46 %   Lymphs Abs 3.0 0.7 - 4.0 K/uL   Monocytes Relative 7 3 - 12 %   Monocytes Absolute 1.2 (H) 0.1 - 1.0 K/uL   Eosinophils Relative 0 0 - 5 %   Eosinophils Absolute 0.0 0.0 - 0.7 K/uL   Basophils Relative 0 0 - 1 %   Basophils Absolute 0.1 0.0 - 0.1 K/uL  Lactic acid, plasma  Result Value Ref Range   Lactic Acid, Venous 1.2 0.5 - 2.2 mmol/L  CBC WITH DIFFERENTIAL  Result Value Ref Range   WBC 18.1 (H) 4.0 - 10.5 K/uL   RBC 4.48 4.22 - 5.81 MIL/uL   Hemoglobin 13.9 13.0 - 17.0 g/dL   HCT 38.3 (L) 39.0 - 52.0 %   MCV 85.5 78.0 - 100.0 fL   MCH 31.0 26.0 - 34.0 pg   MCHC 36.3 (H) 30.0 - 36.0 g/dL   RDW 13.2 11.5 - 15.5 %   Platelets 401 (H) 150 - 400 K/uL   Neutrophils Relative % 78 (H) 43 - 77 %   Neutro Abs 14.2 (H) 1.7 - 7.7 K/uL   Lymphocytes Relative 13 12 - 46 %   Lymphs Abs 2.3 0.7 - 4.0 K/uL   Monocytes Relative 9 3 - 12 %   Monocytes Absolute 1.6 (H) 0.1 - 1.0 K/uL   Eosinophils Relative  0 0 - 5 %   Eosinophils Absolute 0.0 0.0 - 0.7 K/uL   Basophils Relative 0 0 - 1 %   Basophils Absolute 0.0 0.0 - 0.1 K/uL  Basic metabolic panel  Result  Value Ref Range   Sodium 136 (L) 137 - 147 mEq/L   Potassium 3.8 3.7 - 5.3 mEq/L   Chloride 101 96 - 112 mEq/L   CO2 24 19 - 32 mEq/L   Glucose, Bld 116 (H) 70 - 99 mg/dL   BUN 4 (L) 6 - 23 mg/dL   Creatinine, Ser 0.79 0.50 - 1.35 mg/dL   Calcium 8.7 8.4 - 10.5 mg/dL   GFR calc non Af Amer >90 >90 mL/min   GFR calc Af Amer >90 >90 mL/min   Anion gap 11 5 - 15   Imaging Review Dg Chest 2 View  08/16/2014   CLINICAL DATA:  Cough, fever.  EXAM: CHEST  2 VIEW  COMPARISON:  August 11, 2014.  FINDINGS: The heart size and mediastinal contours are within normal limits. No pneumothorax or pleural effusion is noted. Left lung is clear. Right upper lobe opacity is significantly increased consistent with worsening pneumonia. The visualized skeletal structures are unremarkable.  IMPRESSION: Worsening right upper lobe pneumonia. Followup radiographs are recommended until resolution.   Electronically Signed   By: Sabino Dick M.D.   On: 08/16/2014 01:18   Images viewed by me.    EKG Interpretation   Date/Time:  Sunday August 15 2014 23:47:05 EST Ventricular Rate:  97 PR Interval:  130 QRS Duration: 80 QT Interval:  328 QTC Calculation: 416 R Axis:   28 Text Interpretation:  Normal sinus rhythm Minimal voltage criteria for  LVH, may be normal variant Borderline ECG When compared with ECG of  07/28/2014, No significant change was found Confirmed by [facility]  MD, Ren Grasse  ([id number]) on 08/16/2014 12:07:14 AM      MDM   Final diagnoses:  Cough  Fever, unspecified fever cause  community acquired pneumonia  Fever, cough and chest pain with inpatient unit has been treated for pneumonia as an outpatient. And concerned that he has been a treatment failure. Chest x-ray is obtained showing worsening right upper lobe infiltrate. He had been given  which should've been inappropriate antibiotic for community-acquired pneumonia. Therefore, I feel he needs to be admitted for antibiotics until he shows clinical signs of improvement. He is given initial dose of ceftriaxone and azithromycin. Case is discussed with Dr. Shanon Brow of [facility] to request he also be given dose of vancomycin.  I personally performed the services described in this documentation, which was scribed in my presence. The recorded information has been reviewed and is accurate.     Delora Fuel, MD 03/03/15 0109

## 2014-08-17 DIAGNOSIS — A419 Sepsis, unspecified organism: Principal | ICD-10-CM

## 2014-08-17 DIAGNOSIS — F250 Schizoaffective disorder, bipolar type: Secondary | ICD-10-CM

## 2014-08-17 LAB — BASIC METABOLIC PANEL
Anion gap: 12 (ref 5–15)
BUN: 4 mg/dL — ABNORMAL LOW (ref 6–23)
CALCIUM: 9.1 mg/dL (ref 8.4–10.5)
CO2: 27 meq/L (ref 19–32)
CREATININE: 0.83 mg/dL (ref 0.50–1.35)
Chloride: 99 mEq/L (ref 96–112)
GFR calc Af Amer: >90 mL/min (ref >90)
GLUCOSE: 103 mg/dL — AB (ref 70–99)
Potassium: 4.3 mEq/L (ref 3.7–5.3)
SODIUM: 138 meq/L (ref 137–147)

## 2014-08-17 LAB — LEGIONELLA ANTIGEN, URINE

## 2014-08-17 LAB — CBC
HCT: 40.2 % (ref 39.0–52.0)
Hemoglobin: 14.5 g/dL (ref 13.0–17.0)
MCH: 31.2 pg (ref 26.0–34.0)
MCHC: 36.1 g/dL — AB (ref 30.0–36.0)
MCV: 86.5 fL (ref 78.0–100.0)
Platelets: 409 10*3/uL — ABNORMAL HIGH (ref 150–400)
RBC: 4.65 MIL/uL (ref 4.22–5.81)
RDW: 13.3 % (ref 11.5–15.5)
WBC: 15.1 10*3/uL — ABNORMAL HIGH (ref 4.0–10.5)

## 2014-08-17 MED ORDER — ENOXAPARIN SODIUM 40 MG/0.4ML ~~LOC~~ SOLN
40.0000 mg | SUBCUTANEOUS | Status: DC
  Administered 2014-08-18 – 2014-08-22 (×5): 40 mg via SUBCUTANEOUS
  Filled 2014-08-17 (×6): qty 0.4

## 2014-08-17 NOTE — Clinical Documentation Improvement (Signed)
Vitals on admission with tachycardia (90s) and temp 100.8; shortly after admission also developed tachypnea (25-39) requiring O2 @ 2L via [place]. Diagnosed with pneumonia.  Please identify any other associated clinical conditions, if any, and document in your progress note and discharge summary.  Possible Clinical Conditions: -Sepsis (if present -- please indicate "Present on Admission" status) -Other Condition -Pneumonia only -Unable to determine at present  Thank you, Mateo Flow, RN [phone] Clinical Documentation Specialist

## 2014-08-17 NOTE — Progress Notes (Signed)
[facility] PROGRESS NOTE  Jeffrey Coleman FIE:[id number] DOB: [birth year] DOA: 08/15/2014 PCP: [facility]    Code Status: Full code Family Communication: Discussed with patient; family not available. Disposition Plan: Discharge to home when clinically improve, possibly in the next 48 hours.   Consultants:  None  Procedures:  None  Antibiotics:  Azithromycin 08/16/14>  Vancomycin 08/16/14>  Rocephin 08/16/14>  HPI/Subjective: Patient says that he feels better. He has less discomfort and pleurisy when he breathes in. Less chest congestion. He has some continued shortness of breath when ambulating in the room. His cough is nonproductive currently.  Objective: Filed Vitals:   08/17/14 1424  BP: 110/85  Pulse: 102  Temp: 99.8 F (37.7 C)  Resp: 18   oxygen saturation 96% on room air.  Intake/Output Summary (Last 24 hours) at 08/17/14 1750 Last data filed at 08/17/14 0830  Gross per 24 hour  Intake    240 ml  Output    900 ml  Net   -660 ml   Filed Weights   08/15/14 2340 08/16/14 0325  Weight: 58.968 kg (130 lb) 58.32 kg (128 lb 9.2 oz)    Exam:   General:  Pleasant alert 33 year old man sitting up in bed, in no acute distress.  Cardiovascular: S1, S2, with mild tachycardia.  Respiratory: Breathing nonlabored at rest. Lungs with mild to moderate crackles on the right.  Abdomen: Positive bowel sounds, soft, nontender, nondistended.  Musculoskeletal: No acute hot red joints. No pedal edema.  Psychiatric/neurologic: He is alert and oriented 3. Pleasant affect. Speech is clear. Denies visual or auditory hallucinations. Cranial nerves II through XII are intact.   Data Reviewed: Basic Metabolic Panel:  Recent Labs Lab 08/16/14 0014 08/16/14 0423 08/17/14 0544  NA 134* 136* 138  K 3.4* 3.8 4.3  CL 94* 101 99  CO2 25 24 27   GLUCOSE 106* 116* 103*  BUN 5* 4* 4*  CREATININE 0.90 0.79 0.83  CALCIUM 9.1 8.7 9.1    Liver Function Tests:  Recent Labs Lab 08/16/14 0014  AST 17  ALT 17  ALKPHOS 76  BILITOT 0.5  PROT 8.4*  ALBUMIN 2.8*   No results for input(s): LIPASE, AMYLASE in the last 168 hours. No results for input(s): AMMONIA in the last 168 hours. CBC:  Recent Labs Lab 08/16/14 0014 08/16/14 0423 08/17/14 0544  WBC 16.8* 18.1* 15.1*  NEUTROABS 12.6* 14.2*  --   HGB 14.3 13.9 14.5  HCT 40.1 38.3* 40.2  MCV 86.1 85.5 86.5  PLT 386 401* 409*   Cardiac Enzymes: No results for input(s): CKTOTAL, CKMB, CKMBINDEX, TROPONINI in the last 168 hours. BNP (last 3 results) No results for input(s): PROBNP in the last 8760 hours. CBG: No results for input(s): GLUCAP in the last 168 hours.  Recent Results (from the past 240 hour(s))  Culture, blood (routine x 2) Call MD if unable to obtain prior to antibiotics being given     Status: None (Preliminary result)   Collection Time: 08/16/14  4:23 AM  Result Value Ref Range Status   Specimen Description BLOOD RIGHT ARM  Final   Special Requests BOTTLES DRAWN AEROBIC AND ANAEROBIC 6CC  Final   Culture NO GROWTH 1 DAY  Final   Report Status PENDING  Incomplete  Culture, blood (routine x 2) Call MD if unable to obtain prior to antibiotics being given     Status: None (Preliminary result)   Collection Time: 08/16/14  4:34 AM  Result Value  Ref Range Status   Specimen Description BLOOD RIGHT HAND  Final   Special Requests BOTTLES DRAWN AEROBIC AND ANAEROBIC 6CC  Final   Culture NO GROWTH 1 DAY  Final   Report Status PENDING  Incomplete     Studies: Dg Chest 2 View  08/16/2014   CLINICAL DATA:  Cough, fever.  EXAM: CHEST  2 VIEW  COMPARISON:  August 11, 2014.  FINDINGS: The heart size and mediastinal contours are within normal limits. No pneumothorax or pleural effusion is noted. Left lung is clear. Right upper lobe opacity is significantly increased consistent with worsening pneumonia. The visualized skeletal structures are unremarkable.   IMPRESSION: Worsening right upper lobe pneumonia. Followup radiographs are recommended until resolution.   Electronically Signed   By: Sabino Dick M.D.   On: 08/16/2014 01:18    Scheduled Meds: . antiseptic oral rinse  7 mL Mouth Rinse BID  . ARIPiprazole  15 mg Oral Daily  . azithromycin  500 mg Intravenous Q24H  . benzonatate  100 mg Oral TID  . cefTRIAXone (ROCEPHIN)  IV  1 g Intravenous Q24H  . enoxaparin (LOVENOX) injection  40 mg Subcutaneous Q24H  . Influenza vac split quadrivalent PF  0.5 mL Intramuscular Tomorrow-1000  . pneumococcal 23 valent vaccine  0.5 mL Intramuscular Tomorrow-1000  . vancomycin  750 mg Intravenous Q12H   Continuous Infusions:  Assessment and plan: Principal Problem:   Community acquired pneumonia Active Problems:   SIRS (systemic inflammatory response syndrome)   Paranoid schizophrenia   Schizoaffective schizophrenia   1. SIRS secondary to community-acquired pneumonia. Patient failed outpatient therapy with Levaquin. Apparently, an outpatient CT of the chest was scheduled by his PCP because, according to him, there was a concern about a malignancy. I see no clinical indication to order the CT scan of his chest, but if he decompensates and/or if the follow-up chest x-ray tomorrow reveals concerning findings, would order a CT of the chest.  His white blood cell count has improved some. He is less tachycardic and less tachypneic. He is not hypoxic. HIV is nonreactive. Blood cultures are negative to date. Influenza panel is negative. Strep pneumo and legionella antigens are negative. Continue triple antibiotic therapy. Consider narrowing therapy in the next 24-48 hours. Would favor hospitalization for 2 more days for IV antibiotics before transitioning back to oral antibiotics. Follow-up chest x-ray tomorrow morning.  Schizophrenia. Currently stable and controlled on Abilify. Hyponatremia. Resolved with gentle IV fluid hydration. Hypokalemia. Supplemented  and repleted. Mild thrombocytosis. Likely secondary to infection.       Time spent: 30 minutes    [facility] Pager [phone]. If 7PM-7AM, please contact night-coverage at [email or web address], password [facility] 08/17/2014, 5:50 PM  LOS: 2 days

## 2014-08-18 ENCOUNTER — Inpatient Hospital Stay (HOSPITAL_COMMUNITY): Payer: Medicare Other

## 2014-08-18 DIAGNOSIS — J984 Other disorders of lung: Secondary | ICD-10-CM

## 2014-08-18 LAB — CBC
HCT: 42.1 % (ref 39.0–52.0)
Hemoglobin: 14.9 g/dL (ref 13.0–17.0)
MCH: 31 pg (ref 26.0–34.0)
MCHC: 35.4 g/dL (ref 30.0–36.0)
MCV: 87.7 fL (ref 78.0–100.0)
Platelets: 435 10*3/uL — ABNORMAL HIGH (ref 150–400)
RBC: 4.8 MIL/uL (ref 4.22–5.81)
RDW: 13.2 % (ref 11.5–15.5)
WBC: 11.8 10*3/uL — AB (ref 4.0–10.5)

## 2014-08-18 NOTE — Progress Notes (Signed)
[facility] PROGRESS NOTE  Jeffrey Coleman WPY:[id number] DOB: [birth year] DOA: 08/15/2014 PCP: [facility]  Assessment/Plan: CAP -Repeat CXR today shows cavitation formation. -Admits to night sweats and unintentional weight loss. -Moderate risk for TB given mental illness. -Altho, I suspect he most likely has staph PNA, will need to rule out TB. -Place in negative pressure room under airborne isolation and check AFB sputum x3. -Will request pulmonary consult. -Continue current antibiotics: vanc/rocephin/azithro. -Repeat blood cx and sputum cx.  Schizophrenia -Controlled on abilify. -Mood stable.  Code Status: Full Code Family Communication: Patient only  Disposition Plan: To be determined   Consultants:  None   Antibiotics:  Vanc  Rocephin  Azithro   Subjective: SOB has improved.  Objective: Filed Vitals:   08/17/14 1424 08/17/14 2137 08/18/14 0555 08/18/14 1408  BP: 110/85 113/78 133/67 119/76  Pulse: 102 67 88 91  Temp: 99.8 F (37.7 C) 98.6 F (37 C) 98.7 F (37.1 C) 98.8 F (37.1 C)  TempSrc: Oral Oral Oral Oral  Resp: 18 18 20 18   Height:      Weight:      SpO2: 96% 96% 97% 99%    Intake/Output Summary (Last 24 hours) at 08/18/14 1751 Last data filed at 08/18/14 1408  Gross per 24 hour  Intake   1170 ml  Output   2425 ml  Net  -1255 ml   Filed Weights   08/15/14 2340 08/16/14 0325  Weight: 58.968 kg (130 lb) 58.32 kg (128 lb 9.2 oz)    Exam:   General:  AA Ox3  Cardiovascular: RRR  Respiratory: CTA B  Abdomen: S/NT/ND/+BS  Extremities: no C/C/E   Neurologic:  Non-focal  Data Reviewed: Basic Metabolic Panel:  Recent Labs Lab 08/16/14 0014 08/16/14 0423 08/17/14 0544  NA 134* 136* 138  K 3.4* 3.8 4.3  CL 94* 101 99  CO2 25 24 27   GLUCOSE 106* 116* 103*  BUN 5* 4* 4*  CREATININE 0.90 0.79 0.83  CALCIUM 9.1 8.7 9.1   Liver Function Tests:  Recent Labs Lab 08/16/14 0014    AST 17  ALT 17  ALKPHOS 76  BILITOT 0.5  PROT 8.4*  ALBUMIN 2.8*   No results for input(s): LIPASE, AMYLASE in the last 168 hours. No results for input(s): AMMONIA in the last 168 hours. CBC:  Recent Labs Lab 08/16/14 0014 08/16/14 0423 08/17/14 0544 08/18/14 0617  WBC 16.8* 18.1* 15.1* 11.8*  NEUTROABS 12.6* 14.2*  --   --   HGB 14.3 13.9 14.5 14.9  HCT 40.1 38.3* 40.2 42.1  MCV 86.1 85.5 86.5 87.7  PLT 386 401* 409* 435*   Cardiac Enzymes: No results for input(s): CKTOTAL, CKMB, CKMBINDEX, TROPONINI in the last 168 hours. BNP (last 3 results) No results for input(s): PROBNP in the last 8760 hours. CBG: No results for input(s): GLUCAP in the last 168 hours.  Recent Results (from the past 240 hour(s))  Culture, blood (routine x 2) Call MD if unable to obtain prior to antibiotics being given     Status: None (Preliminary result)   Collection Time: 08/16/14  4:23 AM  Result Value Ref Range Status   Specimen Description BLOOD RIGHT ARM  Final   Special Requests BOTTLES DRAWN AEROBIC AND ANAEROBIC 6CC  Final   Culture NO GROWTH 2 DAYS  Final   Report Status PENDING  Incomplete  Culture, blood (routine x 2) Call MD if unable to obtain prior to antibiotics being given  Status: None (Preliminary result)   Collection Time: 08/16/14  4:34 AM  Result Value Ref Range Status   Specimen Description BLOOD RIGHT HAND  Final   Special Requests BOTTLES DRAWN AEROBIC AND ANAEROBIC 6CC  Final   Culture NO GROWTH 2 DAYS  Final   Report Status PENDING  Incomplete     Studies: Dg Chest 2 View  08/18/2014   CLINICAL DATA:  Followup pneumonia  EXAM: CHEST  2 VIEW  COMPARISON:  08/16/2014  FINDINGS: Dense consolidation extends from the superior right hilum throughout much of the right upper lobe. There is central air with small air-fluid levels consistent with cavitation pneumonia.  The overall extent of the lung consolidation is similar to the prior exam. The amount of air within the  presumed cavity has increased.  Remainder of the lungs is clear. No pleural effusion. No pneumothorax.  Cardiac silhouette is normal in size. No mediastinal or left hilar mass or adenopathy.  IMPRESSION: 1. Persistent right upper lobe pneumonia. Air within the pneumonia has increased, and there are now small dependent air-fluid levels, new from the prior exam, findings consistent with necrosis and cavitation. No new areas of lung infection. No other change.   Electronically Signed   By: Lajean Manes M.D.   On: 08/18/2014 09:48    Scheduled Meds: . antiseptic oral rinse  7 mL Mouth Rinse BID  . ARIPiprazole  15 mg Oral Daily  . azithromycin  500 mg Intravenous Q24H  . benzonatate  100 mg Oral TID  . cefTRIAXone (ROCEPHIN)  IV  1 g Intravenous Q24H  . enoxaparin (LOVENOX) injection  40 mg Subcutaneous Q24H  . Influenza vac split quadrivalent PF  0.5 mL Intramuscular Tomorrow-1000  . pneumococcal 23 valent vaccine  0.5 mL Intramuscular Tomorrow-1000  . vancomycin  750 mg Intravenous Q12H   Continuous Infusions:   Principal Problem:   Cavitary pneumonia Active Problems:   Schizoaffective schizophrenia   SIRS (systemic inflammatory response syndrome)    Time spent: 35 minutes. Greater than 50% of this time was spent in direct contact with the patient coordinating care.    Lelon Frohlich  [facility] Pager [phone]  If 7PM-7AM, please contact night-coverage at [email or web address], password [facility] 08/18/2014, 5:51 PM  LOS: 3 days

## 2014-08-19 LAB — BASIC METABOLIC PANEL
Anion gap: 12 (ref 5–15)
BUN: 3 mg/dL — AB (ref 6–23)
CALCIUM: 9.4 mg/dL (ref 8.4–10.5)
CO2: 26 mEq/L (ref 19–32)
CREATININE: 0.75 mg/dL (ref 0.50–1.35)
Chloride: 102 mEq/L (ref 96–112)
GFR calc non Af Amer: >90 mL/min (ref >90)
Glucose, Bld: 99 mg/dL (ref 70–99)
Potassium: 4.5 mEq/L (ref 3.7–5.3)
Sodium: 140 mEq/L (ref 137–147)

## 2014-08-19 LAB — CBC
HCT: 39.9 % (ref 39.0–52.0)
HEMOGLOBIN: 14.3 g/dL (ref 13.0–17.0)
MCH: 31.2 pg (ref 26.0–34.0)
MCHC: 35.8 g/dL (ref 30.0–36.0)
MCV: 87.1 fL (ref 78.0–100.0)
Platelets: 428 10*3/uL — ABNORMAL HIGH (ref 150–400)
RBC: 4.58 MIL/uL (ref 4.22–5.81)
RDW: 13.1 % (ref 11.5–15.5)
WBC: 11 10*3/uL — ABNORMAL HIGH (ref 4.0–10.5)

## 2014-08-19 LAB — EXPECTORATED SPUTUM ASSESSMENT W GRAM STAIN, RFLX TO RESP C

## 2014-08-19 NOTE — Consult Note (Signed)
Consult requested by: [facility] Consult requested for cavitary pneumonia:  HPI: This is a 33 year old who was admitted on the 14th with pneumonia. He had been diagnosed with community-acquired pneumonia 4 days prior to admission and started on Levaquin. However he continued to have chills fever cough and generally not feeling well and so he came back to the emergency department. He was found to have pneumonia and had failed outpatient treatment so he was admitted. He was started on IV antibiotics and did fairly well but had repeat chest x-ray which now shows some cavity formation. He does have a history of night sweats. He does not have any history of exposure to tuberculosis. He has had some weight loss.  Past Medical History  Diagnosis Date  . Schizo affective schizophrenia      Family History  Problem Relation Age of Onset  . Cancer Father      History   Social History  . Marital Status: Single    Spouse Name: N/A    Number of Children: N/A  . Years of Education: N/A   Social History Main Topics  . Smoking status: Current Every Day Smoker -- 0.50 packs/day for 15 years    Types: Cigarettes  . Smokeless tobacco: Never Used  . Alcohol Use: No  . Drug Use: No  . Sexual Activity: None   Other Topics Concern  . None   Social History Narrative     ROS: He denies hemoptysis. No chest pain. No previous history of any sort of lung disease. Otherwise per the history and physical    Objective: Vital signs in last 24 hours: Temp:  [98.3 F (36.8 C)-98.8 F (37.1 C)] 98.5 F (36.9 C) (12/17 0630) Pulse Rate:  [89-100] 89 (12/17 0630) Resp:  [16-20] 16 (12/17 0630) BP: (115-134)/(64-76) 115/75 mmHg (12/17 0630) SpO2:  [98 %-99 %] 99 % (12/17 0630) Weight change:  Last BM Date: 08/18/14 (per pt)  Intake/Output from previous day: 12/16 0701 - 12/17 0700 In: 720 [P.O.:720] Out: 1400 [Urine:1400]  PHYSICAL EXAM He is awake and alert and in no acute distress. His  HEENT exam is generally unremarkable. His nose and throat are clear. His neck is supple. His chest shows rhonchi. His heart is regular without gallop. His abdomen is soft without masses. Extremities showed no edema. Central nervous system exam grossly intact  Lab Results: Basic Metabolic Panel:  Recent Labs  08/17/14 0544 08/19/14 0620  NA 138 140  K 4.3 4.5  CL 99 102  CO2 27 26  GLUCOSE 103* 99  BUN 4* 3*  CREATININE 0.83 0.75  CALCIUM 9.1 9.4   Liver Function Tests: No results for input(s): AST, ALT, ALKPHOS, BILITOT, PROT, ALBUMIN in the last 72 hours. No results for input(s): LIPASE, AMYLASE in the last 72 hours. No results for input(s): AMMONIA in the last 72 hours. CBC:  Recent Labs  08/18/14 0617 08/19/14 0620  WBC 11.8* 11.0*  HGB 14.9 14.3  HCT 42.1 39.9  MCV 87.7 87.1  PLT 435* 428*   Cardiac Enzymes: No results for input(s): CKTOTAL, CKMB, CKMBINDEX, TROPONINI in the last 72 hours. BNP: No results for input(s): PROBNP in the last 72 hours. D-Dimer: No results for input(s): DDIMER in the last 72 hours. CBG: No results for input(s): GLUCAP in the last 72 hours. Hemoglobin A1C: No results for input(s): HGBA1C in the last 72 hours. Fasting Lipid Panel: No results for input(s): CHOL, HDL, LDLCALC, TRIG, CHOLHDL, LDLDIRECT in the last 72 hours.  Thyroid Function Tests: No results for input(s): TSH, T4TOTAL, FREET4, T3FREE, THYROIDAB in the last 72 hours. Anemia Panel: No results for input(s): VITAMINB12, FOLATE, FERRITIN, TIBC, IRON, RETICCTPCT in the last 72 hours. Coagulation: No results for input(s): LABPROT, INR in the last 72 hours. Urine Drug Screen: Drugs of Abuse     Component Value Date/Time   LABOPIA NONE DETECTED 07/28/2014 0140   COCAINSCRNUR NONE DETECTED 07/28/2014 0140   LABBENZ NONE DETECTED 07/28/2014 0140   AMPHETMU NONE DETECTED 07/28/2014 0140   THCU NONE DETECTED 07/28/2014 0140   LABBARB NONE DETECTED 07/28/2014 0140    Alcohol  Level: No results for input(s): ETH in the last 72 hours. Urinalysis: No results for input(s): COLORURINE, LABSPEC, PHURINE, GLUCOSEU, HGBUR, BILIRUBINUR, KETONESUR, PROTEINUR, UROBILINOGEN, NITRITE, LEUKOCYTESUR in the last 72 hours.  Invalid input(s): APPERANCEUR Misc. Labs:   ABGS: No results for input(s): PHART, PO2ART, TCO2, HCO3 in the last 72 hours.  Invalid input(s): PCO2   MICROBIOLOGY: Recent Results (from the past 240 hour(s))  Culture, blood (routine x 2) Call MD if unable to obtain prior to antibiotics being given     Status: None (Preliminary result)   Collection Time: 08/16/14  4:23 AM  Result Value Ref Range Status   Specimen Description BLOOD RIGHT ARM  Final   Special Requests BOTTLES DRAWN AEROBIC AND ANAEROBIC 6CC  Final   Culture NO GROWTH 2 DAYS  Final   Report Status PENDING  Incomplete  Culture, blood (routine x 2) Call MD if unable to obtain prior to antibiotics being given     Status: None (Preliminary result)   Collection Time: 08/16/14  4:34 AM  Result Value Ref Range Status   Specimen Description BLOOD RIGHT HAND  Final   Special Requests BOTTLES DRAWN AEROBIC AND ANAEROBIC [place]  Final   Culture NO GROWTH 2 DAYS  Final   Report Status PENDING  Incomplete    Studies/Results: Dg Chest 2 View  08/18/2014   CLINICAL DATA:  Followup pneumonia  EXAM: CHEST  2 VIEW  COMPARISON:  08/16/2014  FINDINGS: Dense consolidation extends from the superior right hilum throughout much of the right upper lobe. There is central air with small air-fluid levels consistent with cavitation pneumonia.  The overall extent of the lung consolidation is similar to the prior exam. The amount of air within the presumed cavity has increased.  Remainder of the lungs is clear. No pleural effusion. No pneumothorax.  Cardiac silhouette is normal in size. No mediastinal or left hilar mass or adenopathy.  IMPRESSION: 1. Persistent right upper lobe pneumonia. Air within the pneumonia has  increased, and there are now small dependent air-fluid levels, new from the prior exam, findings consistent with necrosis and cavitation. No new areas of lung infection. No other change.   Electronically Signed   By: Lajean Manes M.D.   On: 08/18/2014 09:48    Medications:  Prior to Admission:  Prescriptions prior to admission  Medication Sig Dispense Refill Last Dose  . ARIPiprazole (ABILIFY MAINTENA) 400 MG SUSR Inject 400 mg into the muscle every 30 (thirty) days. Due to start 08/13/2014     . ARIPiprazole (ABILIFY) 15 MG tablet Take 15 mg by mouth daily. Started 08/10/14 for 14 days   08/15/2014 at Unknown time  . levofloxacin (LEVAQUIN) 750 MG tablet Take 750 mg by mouth daily. Started 08/11/14 for 7 days   08/15/2014 at Unknown time   Scheduled: . antiseptic oral rinse  7 mL Mouth Rinse BID  .  ARIPiprazole  15 mg Oral Daily  . azithromycin  500 mg Intravenous Q24H  . benzonatate  100 mg Oral TID  . cefTRIAXone (ROCEPHIN)  IV  1 g Intravenous Q24H  . enoxaparin (LOVENOX) injection  40 mg Subcutaneous Q24H  . Influenza vac split quadrivalent PF  0.5 mL Intramuscular Tomorrow-1000  . pneumococcal 23 valent vaccine  0.5 mL Intramuscular Tomorrow-1000  . vancomycin  750 mg Intravenous Q12H   Continuous:  ZOX:[id number], HYDROcodone-acetaminophen  Assesment: He has cavitary pneumonia. I agree with Dr. Isaac Bliss that this is likely staphylococcal pneumonia and not tuberculosis. However he does need to be in a negative pressure room on respiratory isolation and have 3 AFBs. Principal Problem:   Cavitary pneumonia Active Problems:   Schizoaffective schizophrenia   SIRS (systemic inflammatory response syndrome)    Plan: As above continue with traditional antibiotics and he does not need treatment for TB at this time    LOS: 4 days   Sweet Jarvis L 08/19/2014, 8:37 AM

## 2014-08-19 NOTE — Progress Notes (Signed)
[facility] PROGRESS NOTE  Jeffrey Coleman PJA:[id number] DOB: November 03, 1980 DOA: 08/15/2014 PCP: [facility]  Assessment/Plan: Cavitary Pneumonia -Admits to night sweats and unintentional weight loss. -Moderate risk for TB given mental illness and poor home conditions. -Altho, I suspect he most likely has staph PNA, will need to rule out TB. -Place in negative pressure room under airborne isolation and check AFB sputum x3. -1st AFB smear collected 12/17 with results pending. -Appreciate pulmonary input. -Continue current antibiotics: vanc/rocephin/azithro.  Schizophrenia -Controlled on abilify. -Mood stable. -His psychiatrist, [name], contacted me and would like to be notified if TB positive at [phone].  Code Status: Full Code Family Communication: Patient only  Disposition Plan: To be determined   Consultants:  None   Antibiotics:  Vanc  Rocephin  Azithro   Subjective: SOB has improved.  Objective: Filed Vitals:   08/18/14 1408 08/18/14 2300 08/19/14 0630 08/19/14 1404  BP: 119/76 134/64 115/75 120/79  Pulse: 91 100 89 76  Temp: 98.8 F (37.1 C) 98.3 F (36.8 C) 98.5 F (36.9 C) 98.8 F (37.1 C)  TempSrc: Oral Oral Oral Oral  Resp: 18 20 16 20   Height:      Weight:      SpO2: 99% 98% 99% 98%    Intake/Output Summary (Last 24 hours) at 08/19/14 1530 Last data filed at 08/19/14 1059  Gross per 24 hour  Intake    780 ml  Output   1000 ml  Net   -220 ml   Filed Weights   08/15/14 2340 08/16/14 0325  Weight: 58.968 kg (130 lb) 58.32 kg (128 lb 9.2 oz)    Exam:   General:  AA Ox3  Cardiovascular: RRR  Respiratory: CTA B  Abdomen: S/NT/ND/+BS  Extremities: no C/C/E   Neurologic:  Non-focal  Data Reviewed: Basic Metabolic Panel:  Recent Labs Lab 08/16/14 0014 08/16/14 0423 08/17/14 0544 08/19/14 0620  NA 134* 136* 138 140  K 3.4* 3.8 4.3 4.5  CL 94* 101 99 102  CO2 25 24 27 26    GLUCOSE 106* 116* 103* 99  BUN 5* 4* 4* 3*  CREATININE 0.90 0.79 0.83 0.75  CALCIUM 9.1 8.7 9.1 9.4   Liver Function Tests:  Recent Labs Lab 08/16/14 0014  AST 17  ALT 17  ALKPHOS 76  BILITOT 0.5  PROT 8.4*  ALBUMIN 2.8*   No results for input(s): LIPASE, AMYLASE in the last 168 hours. No results for input(s): AMMONIA in the last 168 hours. CBC:  Recent Labs Lab 08/16/14 0014 08/16/14 0423 08/17/14 0544 08/18/14 0617 08/19/14 0620  WBC 16.8* 18.1* 15.1* 11.8* 11.0*  NEUTROABS 12.6* 14.2*  --   --   --   HGB 14.3 13.9 14.5 14.9 14.3  HCT 40.1 38.3* 40.2 42.1 39.9  MCV 86.1 85.5 86.5 87.7 87.1  PLT 386 401* 409* 435* 428*   Cardiac Enzymes: No results for input(s): CKTOTAL, CKMB, CKMBINDEX, TROPONINI in the last 168 hours. BNP (last 3 results) No results for input(s): PROBNP in the last 8760 hours. CBG: No results for input(s): GLUCAP in the last 168 hours.  Recent Results (from the past 240 hour(s))  Culture, blood (routine x 2) Call MD if unable to obtain prior to antibiotics being given     Status: None (Preliminary result)   Collection Time: 08/16/14  4:23 AM  Result Value Ref Range Status   Specimen Description BLOOD RIGHT ARM  Final   Special Requests BOTTLES DRAWN AEROBIC  AND ANAEROBIC 6CC  Final   Culture NO GROWTH 3 DAYS  Final   Report Status PENDING  Incomplete  Culture, blood (routine x 2) Call MD if unable to obtain prior to antibiotics being given     Status: None (Preliminary result)   Collection Time: 08/16/14  4:34 AM  Result Value Ref Range Status   Specimen Description BLOOD RIGHT HAND  Final   Special Requests BOTTLES DRAWN AEROBIC AND ANAEROBIC 6CC  Final   Culture NO GROWTH 3 DAYS  Final   Report Status PENDING  Incomplete  Culture, sputum-assessment     Status: None   Collection Time: 08/19/14  7:53 AM  Result Value Ref Range Status   Specimen Description SPUTUM EXPECTORATED  Final   Special Requests NONE  Final   Sputum evaluation    Final    THIS SPECIMEN IS ACCEPTABLE. RESPIRATORY CULTURE REPORT TO FOLLOW. Performed at [facility]    Report Status 08/19/2014 FINAL  Final     Studies: Dg Chest 2 View  08/18/2014   CLINICAL DATA:  Followup pneumonia  EXAM: CHEST  2 VIEW  COMPARISON:  08/16/2014  FINDINGS: Dense consolidation extends from the superior right hilum throughout much of the right upper lobe. There is central air with small air-fluid levels consistent with cavitation pneumonia.  The overall extent of the lung consolidation is similar to the prior exam. The amount of air within the presumed cavity has increased.  Remainder of the lungs is clear. No pleural effusion. No pneumothorax.  Cardiac silhouette is normal in size. No mediastinal or left hilar mass or adenopathy.  IMPRESSION: 1. Persistent right upper lobe pneumonia. Air within the pneumonia has increased, and there are now small dependent air-fluid levels, new from the prior exam, findings consistent with necrosis and cavitation. No new areas of lung infection. No other change.   Electronically Signed   By: Lajean Manes M.D.   On: 08/18/2014 09:48    Scheduled Meds: . antiseptic oral rinse  7 mL Mouth Rinse BID  . ARIPiprazole  15 mg Oral Daily  . azithromycin  500 mg Intravenous Q24H  . benzonatate  100 mg Oral TID  . cefTRIAXone (ROCEPHIN)  IV  1 g Intravenous Q24H  . enoxaparin (LOVENOX) injection  40 mg Subcutaneous Q24H  . Influenza vac split quadrivalent PF  0.5 mL Intramuscular Tomorrow-1000  . pneumococcal 23 valent vaccine  0.5 mL Intramuscular Tomorrow-1000  . vancomycin  750 mg Intravenous Q12H   Continuous Infusions:   Principal Problem:   Cavitary pneumonia Active Problems:   Schizoaffective schizophrenia   SIRS (systemic inflammatory response syndrome)    Time spent: 25 minutes. Greater than 50% of this time was spent in direct contact with the patient coordinating care.    Lelon Frohlich  [facility] Pager [phone]  If 7PM-7AM, please contact night-coverage at [email or web address], password [facility] 08/19/2014, 3:30 PM  LOS: 4 days

## 2014-08-19 NOTE — Progress Notes (Signed)
UR chart review completed.  

## 2014-08-20 LAB — VANCOMYCIN, TROUGH: Vancomycin Tr: 5.3 ug/mL — ABNORMAL LOW (ref 10.0–20.0)

## 2014-08-20 MED ORDER — VANCOMYCIN HCL IN DEXTROSE 750-5 MG/150ML-% IV SOLN
750.0000 mg | Freq: Three times a day (TID) | INTRAVENOUS | Status: DC
  Administered 2014-08-21 – 2014-08-23 (×8): 750 mg via INTRAVENOUS
  Filled 2014-08-20 (×11): qty 150

## 2014-08-20 NOTE — Progress Notes (Signed)
He says he feels okay. He has no new complaints. He is coughing but not as much. His first AFB smear has been collected and is adequate. I told him he needs to have 3 AFBs collected. If they are negative he can go home with treatment for cavitary pneumonia. Typically with cavitary pneumonia treatment would be for at least 2 weeks and would like to see the cavity re-saw before antibiotics are discontinued. If he is positive for TB of course needs to be treated for that but I do not think that is very likely.  His exam shows that he is awake and alert and comfortable. His chest is relatively clear. Heart is regular.  Assessment and plan as above

## 2014-08-20 NOTE — Progress Notes (Signed)
ANTIBIOTIC CONSULT NOTE - FOLLOW UP  Pharmacy Consult for Vancomycin Indication: Cavitary Pneumonia  No Known Allergies  Patient Measurements: Height: 5\' 4"  (162.6 cm) Weight: 128 lb 9.2 oz (58.32 kg) IBW/kg (Calculated) : 59.2  Vital Signs: Temp: 99.1 F (37.3 C) (12/18 1354) Temp Source: Oral (12/18 1354) BP: 120/79 mmHg (12/18 1354) Pulse Rate: 73 (12/18 1354) Intake/Output from previous day: 12/17 0701 - 12/18 0700 In: 630 [P.O.:480; IV Piggyback:150] Out: -  Intake/Output from this shift: Total I/O In: 480 [P.O.:480] Out: -   Labs:  Recent Labs  08/18/14 0617 08/19/14 0620  WBC 11.8* 11.0*  HGB 14.9 14.3  PLT 435* 428*  CREATININE  --  0.75   Estimated Creatinine Clearance: 108.3 mL/min (by C-G formula based on Cr of 0.75).  Recent Labs  08/20/14 1714  VANCOTROUGH 5.3*     Microbiology: Recent Results (from the past 720 hour(s))  Culture, blood (routine x 2) Call MD if unable to obtain prior to antibiotics being given     Status: None (Preliminary result)   Collection Time: 08/16/14  4:23 AM  Result Value Ref Range Status   Specimen Description BLOOD RIGHT ARM  Final   Special Requests BOTTLES DRAWN AEROBIC AND ANAEROBIC 6CC  Final   Culture NO GROWTH 3 DAYS  Final   Report Status PENDING  Incomplete  Culture, blood (routine x 2) Call MD if unable to obtain prior to antibiotics being given     Status: None (Preliminary result)   Collection Time: 08/16/14  4:34 AM  Result Value Ref Range Status   Specimen Description BLOOD RIGHT HAND  Final   Special Requests BOTTLES DRAWN AEROBIC AND ANAEROBIC 6CC  Final   Culture NO GROWTH 3 DAYS  Final   Report Status PENDING  Incomplete  Culture, sputum-assessment     Status: None   Collection Time: 08/19/14  7:53 AM  Result Value Ref Range Status   Specimen Description SPUTUM EXPECTORATED  Final   Special Requests NONE  Final   Sputum evaluation   Final    THIS SPECIMEN IS ACCEPTABLE. RESPIRATORY CULTURE  REPORT TO FOLLOW. Performed at [facility]    Report Status 08/19/2014 FINAL  Final  AFB culture with smear     Status: None (Preliminary result)   Collection Time: 08/19/14  7:53 AM  Result Value Ref Range Status   Specimen Description SPUTUM EXPECTORATED  Final   Special Requests NONE  Final   Acid Fast Smear   Final    NO ACID FAST BACILLI SEEN Performed at [facility]    Culture   Final    CULTURE WILL BE EXAMINED FOR 6 WEEKS BEFORE ISSUING A FINAL REPORT Performed at [facility]    Report Status PENDING  Incomplete  Culture, respiratory (NON-Expectorated)     Status: None (Preliminary result)   Collection Time: 08/19/14  7:53 AM  Result Value Ref Range Status   Specimen Description SPUTUM EXPECTORATED  Final   Special Requests NONE  Final   Gram Stain   Final    FEW WBC PRESENT,BOTH PMN AND MONONUCLEAR RARE SQUAMOUS EPITHELIAL CELLS PRESENT FEW GRAM POSITIVE COCCI IN PAIRS IN CLUSTERS RARE GRAM NEGATIVE RODS Performed at [facility]    Culture   Final    Culture reincubated for better growth Performed at [facility]    Report Status PENDING  Incomplete    Anti-infectives    Start     Dose/Rate Route Frequency Ordered  Stop   08/16/14 2200  cefTRIAXone (ROCEPHIN) 1 g in dextrose 5 % 50 mL IVPB - Premix     1 g100 mL/hr over 30 Minutes Intravenous Every 24 hours 08/16/14 0349 08/23/14 2159   08/16/14 2100  azithromycin (ZITHROMAX) 500 mg in dextrose 5 % 250 mL IVPB     500 mg250 mL/hr over 60 Minutes Intravenous Every 24 hours 08/16/14 0349 08/23/14 2059   08/16/14 1800  vancomycin (VANCOCIN) IVPB 750 mg/150 ml premix     750 mg150 mL/hr over 60 Minutes Intravenous Every 12 hours 08/16/14 0224     08/16/14 0300  vancomycin (VANCOCIN) IVPB 1000 mg/200 mL premix     1,000 mg200 mL/hr over 60 Minutes Intravenous  Once 08/16/14 0224 08/16/14 0600   08/16/14 0130  cefTRIAXone (ROCEPHIN) 1 g in dextrose 5 % 50 mL IVPB     1 g100  mL/hr over 30 Minutes Intravenous  Once 08/16/14 0129 08/16/14 0215   08/16/14 0130  azithromycin (ZITHROMAX) 500 mg in dextrose 5 % 250 mL IVPB     500 mg250 mL/hr over 60 Minutes Intravenous  Once 08/16/14 0129 08/16/14 0315     Assessment: Okay for Protocol, trough below goal.  Goal of Therapy:  Vancomycin trough level 15-20 mcg/ml  Plan:  Increase Vancomycin to 750mg  IV every 8 hrs. Measure antibiotic drug levels at steady state Follow up culture results  Pricilla Larsson 08/20/2014,6:26 PM

## 2014-08-20 NOTE — Progress Notes (Signed)
[facility] PROGRESS NOTE  KHRYSTIAN SCHAUF FGH:[id number] DOB: 16-Mar-1981 DOA: 08/15/2014 PCP: [facility]  Assessment/Plan: Cavitary Pneumonia -Admits to night sweats and unintentional weight loss. -Moderate risk for TB given mental illness and poor home conditions. -Altho, I suspect he most likely has staph PNA, will need to rule out TB. -Place in negative pressure room under airborne isolation and check AFB sputum x3. -2 AFB smears have been collected and results are currently pending. -Appreciate pulmonary input. -Continue current antibiotics: vanc/rocephin/azithro.  Schizophrenia -Controlled on abilify. -Mood stable. -His psychiatrist, Dr. Duane Boston, contacted me and would like to be notified if TB positive at [phone].  Code Status: Full Code Family Communication: Patient only  Disposition Plan: To be determined   Consultants:  None   Antibiotics:  Vanc  Rocephin  Azithro   Subjective: SOB has improved.  Objective: Filed Vitals:   08/19/14 0630 08/19/14 1404 08/19/14 2332 08/20/14 0631  BP: 115/75 120/79 116/83 119/86  Pulse: 89 76 75 86  Temp: 98.5 F (36.9 C) 98.8 F (37.1 C) 98.7 F (37.1 C) 98.4 F (36.9 C)  TempSrc: Oral Oral Oral Oral  Resp: 16 20 15 20   Height:      Weight:      SpO2: 99% 98% 97% 98%    Intake/Output Summary (Last 24 hours) at 08/20/14 1346 Last data filed at 08/20/14 0800  Gross per 24 hour  Intake    630 ml  Output      0 ml  Net    630 ml   Filed Weights   08/15/14 2340 08/16/14 0325  Weight: 58.968 kg (130 lb) 58.32 kg (128 lb 9.2 oz)    Exam:   General:  AA Ox3  Cardiovascular: RRR  Respiratory: CTA B  Abdomen: S/NT/ND/+BS  Extremities: no C/C/E   Neurologic:  Non-focal  Data Reviewed: Basic Metabolic Panel:  Recent Labs Lab 08/16/14 0014 08/16/14 0423 08/17/14 0544 08/19/14 0620  NA 134* 136* 138 140  K 3.4* 3.8 4.3 4.5  CL 94* 101 99 102   CO2 25 24 27 26   GLUCOSE 106* 116* 103* 99  BUN 5* 4* 4* 3*  CREATININE 0.90 0.79 0.83 0.75  CALCIUM 9.1 8.7 9.1 9.4   Liver Function Tests:  Recent Labs Lab 08/16/14 0014  AST 17  ALT 17  ALKPHOS 76  BILITOT 0.5  PROT 8.4*  ALBUMIN 2.8*   No results for input(s): LIPASE, AMYLASE in the last 168 hours. No results for input(s): AMMONIA in the last 168 hours. CBC:  Recent Labs Lab 08/16/14 0014 08/16/14 0423 08/17/14 0544 08/18/14 0617 08/19/14 0620  WBC 16.8* 18.1* 15.1* 11.8* 11.0*  NEUTROABS 12.6* 14.2*  --   --   --   HGB 14.3 13.9 14.5 14.9 14.3  HCT 40.1 38.3* 40.2 42.1 39.9  MCV 86.1 85.5 86.5 87.7 87.1  PLT 386 401* 409* 435* 428*   Cardiac Enzymes: No results for input(s): CKTOTAL, CKMB, CKMBINDEX, TROPONINI in the last 168 hours. BNP (last 3 results) No results for input(s): PROBNP in the last 8760 hours. CBG: No results for input(s): GLUCAP in the last 168 hours.  Recent Results (from the past 240 hour(s))  Culture, blood (routine x 2) Call MD if unable to obtain prior to antibiotics being given     Status: None (Preliminary result)   Collection Time: 08/16/14  4:23 AM  Result Value Ref Range Status   Specimen Description BLOOD RIGHT ARM  Final   Special Requests BOTTLES DRAWN AEROBIC AND ANAEROBIC 6CC  Final   Culture NO GROWTH 3 DAYS  Final   Report Status PENDING  Incomplete  Culture, blood (routine x 2) Call MD if unable to obtain prior to antibiotics being given     Status: None (Preliminary result)   Collection Time: 08/16/14  4:34 AM  Result Value Ref Range Status   Specimen Description BLOOD RIGHT HAND  Final   Special Requests BOTTLES DRAWN AEROBIC AND ANAEROBIC 6CC  Final   Culture NO GROWTH 3 DAYS  Final   Report Status PENDING  Incomplete  Culture, sputum-assessment     Status: None   Collection Time: 08/19/14  7:53 AM  Result Value Ref Range Status   Specimen Description SPUTUM EXPECTORATED  Final   Special Requests NONE  Final    Sputum evaluation   Final    THIS SPECIMEN IS ACCEPTABLE. RESPIRATORY CULTURE REPORT TO FOLLOW. Performed at [facility]    Report Status 08/19/2014 FINAL  Final  Culture, respiratory (NON-Expectorated)     Status: None (Preliminary result)   Collection Time: 08/19/14  7:53 AM  Result Value Ref Range Status   Specimen Description SPUTUM EXPECTORATED  Final   Special Requests NONE  Final   Gram Stain   Final    FEW WBC PRESENT,BOTH PMN AND MONONUCLEAR RARE SQUAMOUS EPITHELIAL CELLS PRESENT FEW GRAM POSITIVE COCCI IN PAIRS IN CLUSTERS RARE GRAM NEGATIVE RODS Performed at [facility]    Culture   Final    Culture reincubated for better growth Performed at [facility]    Report Status PENDING  Incomplete     Studies: No results found.  Scheduled Meds: . antiseptic oral rinse  7 mL Mouth Rinse BID  . ARIPiprazole  15 mg Oral Daily  . azithromycin  500 mg Intravenous Q24H  . benzonatate  100 mg Oral TID  . cefTRIAXone (ROCEPHIN)  IV  1 g Intravenous Q24H  . enoxaparin (LOVENOX) injection  40 mg Subcutaneous Q24H  . Influenza vac split quadrivalent PF  0.5 mL Intramuscular Tomorrow-1000  . pneumococcal 23 valent vaccine  0.5 mL Intramuscular Tomorrow-1000  . vancomycin  750 mg Intravenous Q12H   Continuous Infusions:   Principal Problem:   Cavitary pneumonia Active Problems:   Schizoaffective schizophrenia   SIRS (systemic inflammatory response syndrome)    Time spent: 25 minutes. Greater than 50% of this time was spent in direct contact with the patient coordinating care.    Lelon Frohlich  [facility] Pager [phone]  If 7PM-7AM, please contact night-coverage at [email or web address], password [facility] 08/20/2014, 1:46 PM  LOS: 5 days

## 2014-08-21 ENCOUNTER — Inpatient Hospital Stay (HOSPITAL_COMMUNITY): Payer: Medicare Other

## 2014-08-21 LAB — CBC
HCT: 42.3 % (ref 39.0–52.0)
HEMOGLOBIN: 15 g/dL (ref 13.0–17.0)
MCH: 31.1 pg (ref 26.0–34.0)
MCHC: 35.5 g/dL (ref 30.0–36.0)
MCV: 87.8 fL (ref 78.0–100.0)
Platelets: 450 10*3/uL — ABNORMAL HIGH (ref 150–400)
RBC: 4.82 MIL/uL (ref 4.22–5.81)
RDW: 13.1 % (ref 11.5–15.5)
WBC: 14.1 10*3/uL — ABNORMAL HIGH (ref 4.0–10.5)

## 2014-08-21 LAB — BASIC METABOLIC PANEL
Anion gap: 12 (ref 5–15)
BUN: 4 mg/dL — AB (ref 6–23)
CHLORIDE: 99 meq/L (ref 96–112)
CO2: 27 mEq/L (ref 19–32)
Calcium: 9.5 mg/dL (ref 8.4–10.5)
Creatinine, Ser: 0.76 mg/dL (ref 0.50–1.35)
GFR calc Af Amer: >90 mL/min (ref >90)
GFR calc non Af Amer: >90 mL/min (ref >90)
GLUCOSE: 96 mg/dL (ref 70–99)
Potassium: 4.7 mEq/L (ref 3.7–5.3)
Sodium: 138 mEq/L (ref 137–147)

## 2014-08-21 LAB — CULTURE, BLOOD (ROUTINE X 2)
Culture: NO GROWTH
Culture: NO GROWTH

## 2014-08-21 LAB — CULTURE, RESPIRATORY: CULTURE: NORMAL

## 2014-08-21 LAB — CULTURE, RESPIRATORY W GRAM STAIN

## 2014-08-21 MED ORDER — IOHEXOL 300 MG/ML  SOLN
80.0000 mL | Freq: Once | INTRAMUSCULAR | Status: AC | PRN
  Administered 2014-08-21: 80 mL via INTRAVENOUS

## 2014-08-21 MED ORDER — POTASSIUM CHLORIDE IN NACL 20-0.9 MEQ/L-% IV SOLN
INTRAVENOUS | Status: DC
  Administered 2014-08-21: 19:00:00 via INTRAVENOUS

## 2014-08-21 NOTE — Progress Notes (Signed)
[facility] PROGRESS NOTE  Jeffrey Coleman [id number]:[id number] DOB: Dec 11, 1980 DOA: 08/15/2014 PCP: [facility]    Code Status: Full code Family Communication: Discussed with patient; family not available. Disposition Plan: Discharge to home when clinically improved   Consultants:  Pulmonologist, Dr. Luan Pulling  Procedures:  None  Antibiotics:  Azithromycin 08/16/14>  Vancomycin 08/16/14>  Rocephin 08/16/14>  HPI/Subjective: The patient has no new complaints. He has no short of breath or pleurisy at rest. He has a productive cough with yellow sputum.  Objective: Filed Vitals:   08/21/14 1417  BP: 121/81  Pulse: 85  Temp: 98.5 F (36.9 C)  Resp: 20   oxygen saturation 99% on room air.  Intake/Output Summary (Last 24 hours) at 08/21/14 1747 Last data filed at 08/21/14 1200  Gross per 24 hour  Intake    720 ml  Output      0 ml  Net    720 ml   Filed Weights   08/15/14 2340 08/16/14 0325  Weight: 58.968 kg (130 lb) 58.32 kg (128 lb 9.2 oz)    Exam:   General:  Pleasant alert 33 year old man sitting up in bed, in no acute distress.  Cardiovascular: S1, S2, no murmurs rubs or gallops.  Respiratory: Breathing nonlabored at rest. Lungs  Mostly clear with rare right upper lobe crackles.  Abdomen: Positive bowel sounds, soft, nontender, nondistended.  Musculoskeletal: No acute hot red joints. No pedal edema.  Psychiatric/neurologic: He is alert and oriented 3. Pleasant affect. Speech is clear. Denies visual or auditory hallucinations. Cranial nerves II through XII are intact.   Data Reviewed: Basic Metabolic Panel:  Recent Labs Lab 08/16/14 0014 08/16/14 0423 08/17/14 0544 08/19/14 0620 08/21/14 0606  NA 134* 136* 138 140 138  K 3.4* 3.8 4.3 4.5 4.7  CL 94* 101 99 102 99  CO2 25 24 27 26 27   GLUCOSE 106* 116* 103* 99 96  BUN 5* 4* 4* 3* 4*  CREATININE 0.90 0.79 0.83 0.75 0.76  CALCIUM 9.1 8.7 9.1 9.4 9.5   Liver  Function Tests:  Recent Labs Lab 08/16/14 0014  AST 17  ALT 17  ALKPHOS 76  BILITOT 0.5  PROT 8.4*  ALBUMIN 2.8*   No results for input(s): LIPASE, AMYLASE in the last 168 hours. No results for input(s): AMMONIA in the last 168 hours. CBC:  Recent Labs Lab 08/16/14 0014 08/16/14 0423 08/17/14 0544 08/18/14 0617 08/19/14 0620 08/21/14 0606  WBC 16.8* 18.1* 15.1* 11.8* 11.0* 14.1*  NEUTROABS 12.6* 14.2*  --   --   --   --   HGB 14.3 13.9 14.5 14.9 14.3 15.0  HCT 40.1 38.3* 40.2 42.1 39.9 42.3  MCV 86.1 85.5 86.5 87.7 87.1 87.8  PLT 386 401* 409* 435* 428* 450*   Cardiac Enzymes: No results for input(s): CKTOTAL, CKMB, CKMBINDEX, TROPONINI in the last 168 hours. BNP (last 3 results) No results for input(s): PROBNP in the last 8760 hours. CBG: No results for input(s): GLUCAP in the last 168 hours.  Recent Results (from the past 240 hour(s))  Culture, blood (routine x 2) Call MD if unable to obtain prior to antibiotics being given     Status: None   Collection Time: 08/16/14  4:23 AM  Result Value Ref Range Status   Specimen Description BLOOD RIGHT ARM  Final   Special Requests BOTTLES DRAWN AEROBIC AND ANAEROBIC [facility]  Final   Culture NO GROWTH 5 DAYS  Final   Report Status 08/21/2014  FINAL  Final  Culture, blood (routine x 2) Call MD if unable to obtain prior to antibiotics being given     Status: None   Collection Time: 08/16/14  4:34 AM  Result Value Ref Range Status   Specimen Description BLOOD RIGHT HAND  Final   Special Requests BOTTLES DRAWN AEROBIC AND ANAEROBIC 6CC  Final   Culture NO GROWTH 5 DAYS  Final   Report Status 08/21/2014 FINAL  Final  Culture, sputum-assessment     Status: None   Collection Time: 08/19/14  7:53 AM  Result Value Ref Range Status   Specimen Description SPUTUM EXPECTORATED  Final   Special Requests NONE  Final   Sputum evaluation   Final    THIS SPECIMEN IS ACCEPTABLE. RESPIRATORY CULTURE REPORT TO FOLLOW. Performed at [facility]    Report Status 08/19/2014 FINAL  Final  AFB culture with smear     Status: None (Preliminary result)   Collection Time: 08/19/14  7:53 AM  Result Value Ref Range Status   Specimen Description SPUTUM EXPECTORATED  Final   Special Requests NONE  Final   Acid Fast Smear   Final    NO ACID FAST BACILLI SEEN Performed at [facility]    Culture   Final    CULTURE WILL BE EXAMINED FOR 6 WEEKS BEFORE ISSUING A FINAL REPORT Performed at [facility]    Report Status PENDING  Incomplete  Culture, respiratory (NON-Expectorated)     Status: None   Collection Time: 08/19/14  7:53 AM  Result Value Ref Range Status   Specimen Description SPUTUM EXPECTORATED  Final   Special Requests NONE  Final   Gram Stain   Final    FEW WBC PRESENT,BOTH PMN AND MONONUCLEAR RARE SQUAMOUS EPITHELIAL CELLS PRESENT FEW GRAM POSITIVE COCCI IN PAIRS IN CLUSTERS RARE GRAM NEGATIVE RODS Performed at [facility]    Culture   Final    NORMAL OROPHARYNGEAL FLORA Performed at [facility]    Report Status 08/21/2014 FINAL  Final  AFB culture with smear     Status: None (Preliminary result)   Collection Time: 08/20/14  8:12 AM  Result Value Ref Range Status   Specimen Description TRACHEAL SITE  Final   Special Requests Normal  Final   Acid Fast Smear   Final    NO ACID FAST BACILLI SEEN Performed at [facility]    Culture   Final    CULTURE WILL BE EXAMINED FOR 6 WEEKS BEFORE ISSUING A FINAL REPORT Performed at [facility]    Report Status PENDING  Incomplete     Studies: No results found.  Scheduled Meds: . antiseptic oral rinse  7 mL Mouth Rinse BID  . ARIPiprazole  15 mg Oral Daily  . azithromycin  500 mg Intravenous Q24H  . benzonatate  100 mg Oral TID  . cefTRIAXone (ROCEPHIN)  IV  1 g Intravenous Q24H  . enoxaparin (LOVENOX) injection  40 mg Subcutaneous Q24H  . Influenza vac split quadrivalent PF  0.5 mL Intramuscular  Tomorrow-1000  . pneumococcal 23 valent vaccine  0.5 mL Intramuscular Tomorrow-1000  . vancomycin  750 mg Intravenous Q8H   Continuous Infusions:  Assessment and plan: Principal Problem:   Cavitary pneumonia Active Problems:   SIRS (systemic inflammatory response syndrome)   Schizoaffective schizophrenia   1. Cavitary pneumonia. Follow-up chest x-ray on 12/16 revealed cavitary pneumonia. Patient is being assessed for TB, although the cavitary pneumonia is felt  to be secondary to Staphylococcus. Dr. Luan Pulling is following and agrees with AFB smears. AFB smears are negative times two. Cultures pending. Blood cultures negative 2 to date. HIV was nonreactive. Influenza panel was negative. Strep pneumo and Legionella antigens were negative. He is improving clinically except that his white blood cell count has increased. Would favor getting a CT scan of his chest for further evaluation. Ordered and pending. We'll  restart gentle IV fluids because of the IV contrast  that will be given. Will continue triple antibiotic therapy for now. Patient failed outpatient therapy with Levaquin. Apparently, an outpatient CT of the chest was scheduled by his PCP because, according to him, there was a concern about a malignancy.  SIRS secondary to #1. Resolved.  Schizophrenia. Currently stable and controlled on Abilify. Per Dr. Jerilee Hoh, his psychiatrist, Dr. Duane Boston would like to be notified if his TB is positive at [phone].  Hyponatremia. Resolved with gentle IV fluid hydration.  Hypokalemia. Supplemented and repleted.  Mild thrombocytosis. Likely secondary to infection.       Time spent: 30 minutes    [facility] Pager [phone]. If 7PM-7AM, please contact night-coverage at [email or web address], password [facility] 08/21/2014, 5:47 PM  LOS: 6 days

## 2014-08-21 NOTE — Progress Notes (Signed)
He says he feels better. He has no new complaints. He is not short of breath. One of his acid-fast smears has returned and is negative. Gram stain of his sputum is suggestive of Staphylococcus which is what I think the diagnosis is. Continue current treatments and await the other acid-fast smears

## 2014-08-22 DIAGNOSIS — F17200 Nicotine dependence, unspecified, uncomplicated: Secondary | ICD-10-CM

## 2014-08-22 LAB — CBC
HCT: 41.4 % (ref 39.0–52.0)
Hemoglobin: 14.4 g/dL (ref 13.0–17.0)
MCH: 30.6 pg (ref 26.0–34.0)
MCHC: 34.8 g/dL (ref 30.0–36.0)
MCV: 87.9 fL (ref 78.0–100.0)
PLATELETS: 462 10*3/uL — AB (ref 150–400)
RBC: 4.71 MIL/uL (ref 4.22–5.81)
RDW: 13 % (ref 11.5–15.5)
WBC: 11.9 10*3/uL — ABNORMAL HIGH (ref 4.0–10.5)

## 2014-08-22 NOTE — Progress Notes (Addendum)
Subjective: He was admitted with community-acquired pneumonia. On follow-up chest x-ray this appeared to be cavitary. He is being ruled out for tuberculosis and has 2 negative sputum's so far. He says he feels better. He is not coughing as much.  Objective: Vital signs in last 24 hours: Temp:  [97.5 F (36.4 C)-98.5 F (36.9 C)] 97.5 F (36.4 C) (12/20 0625) Pulse Rate:  [63-85] 77 (12/20 0625) Resp:  [16-20] 16 (12/20 0625) BP: (107-124)/(65-85) 107/65 mmHg (12/20 0625) SpO2:  [98 %-99 %] 98 % (12/20 0625) Weight change:  Last BM Date: 08/21/14  Intake/Output from previous day: 12/19 0701 - 12/20 0700 In: 1680 [P.O.:1680] Out: -   PHYSICAL EXAM General appearance: alert, cooperative and no distress Resp: clear to auscultation bilaterally Cardio: regular rate and rhythm, S1, S2 normal, no murmur, click, rub or gallop GI: soft, non-tender; bowel sounds normal; no masses,  no organomegaly Extremities: extremities normal, atraumatic, no cyanosis or edema  Lab Results:  Results for orders placed or performed during the hospital encounter of 08/15/14 (from the past 48 hour(s))  Vancomycin, trough     Status: Abnormal   Collection Time: 08/20/14  5:14 PM  Result Value Ref Range   Vancomycin Tr 5.3 (L) 10.0 - 20.0 ug/mL  Basic metabolic panel     Status: Abnormal   Collection Time: 08/21/14  6:06 AM  Result Value Ref Range   Sodium 138 137 - 147 mEq/L   Potassium 4.7 3.7 - 5.3 mEq/L   Chloride 99 96 - 112 mEq/L   CO2 27 19 - 32 mEq/L   Glucose, Bld 96 70 - 99 mg/dL   BUN 4 (L) 6 - 23 mg/dL   Creatinine, Ser 0.76 0.50 - 1.35 mg/dL   Calcium 9.5 8.4 - 10.5 mg/dL   GFR calc non Af Amer >90 >90 mL/min   GFR calc Af Amer >90 >90 mL/min    Comment: (NOTE) The eGFR has been calculated using the CKD EPI equation. This calculation has not been validated in all clinical situations. eGFR's persistently <90 mL/min signify possible Chronic Kidney Disease.    Anion gap 12 5 - 15   CBC     Status: Abnormal   Collection Time: 08/21/14  6:06 AM  Result Value Ref Range   WBC 14.1 (H) 4.0 - 10.5 K/uL   RBC 4.82 4.22 - 5.81 MIL/uL   Hemoglobin 15.0 13.0 - 17.0 g/dL   HCT 42.3 39.0 - 52.0 %   MCV 87.8 78.0 - 100.0 fL   MCH 31.1 26.0 - 34.0 pg   MCHC 35.5 30.0 - 36.0 g/dL   RDW 13.1 11.5 - 15.5 %   Platelets 450 (H) 150 - 400 K/uL  CBC     Status: Abnormal   Collection Time: 08/22/14  5:55 AM  Result Value Ref Range   WBC 11.9 (H) 4.0 - 10.5 K/uL   RBC 4.71 4.22 - 5.81 MIL/uL   Hemoglobin 14.4 13.0 - 17.0 g/dL   HCT 41.4 39.0 - 52.0 %   MCV 87.9 78.0 - 100.0 fL   MCH 30.6 26.0 - 34.0 pg   MCHC 34.8 30.0 - 36.0 g/dL   RDW 13.0 11.5 - 15.5 %   Platelets 462 (H) 150 - 400 K/uL    ABGS No results for input(s): PHART, PO2ART, TCO2, HCO3 in the last 72 hours.  Invalid input(s): PCO2 CULTURES Recent Results (from the past 240 hour(s))  Culture, blood (routine x 2) Call MD if unable to obtain  prior to antibiotics being given     Status: None   Collection Time: 08/16/14  4:23 AM  Result Value Ref Range Status   Specimen Description BLOOD RIGHT ARM  Final   Special Requests BOTTLES DRAWN AEROBIC AND ANAEROBIC 6CC  Final   Culture NO GROWTH 5 DAYS  Final   Report Status 08/21/2014 FINAL  Final  Culture, blood (routine x 2) Call MD if unable to obtain prior to antibiotics being given     Status: None   Collection Time: 08/16/14  4:34 AM  Result Value Ref Range Status   Specimen Description BLOOD RIGHT HAND  Final   Special Requests BOTTLES DRAWN AEROBIC AND ANAEROBIC 6CC  Final   Culture NO GROWTH 5 DAYS  Final   Report Status 08/21/2014 FINAL  Final  Culture, sputum-assessment     Status: None   Collection Time: 08/19/14  7:53 AM  Result Value Ref Range Status   Specimen Description SPUTUM EXPECTORATED  Final   Special Requests NONE  Final   Sputum evaluation   Final    THIS SPECIMEN IS ACCEPTABLE. RESPIRATORY CULTURE REPORT TO FOLLOW. Performed at [facility]    Report Status 08/19/2014 FINAL  Final  AFB culture with smear     Status: None (Preliminary result)   Collection Time: 08/19/14  7:53 AM  Result Value Ref Range Status   Specimen Description SPUTUM EXPECTORATED  Final   Special Requests NONE  Final   Acid Fast Smear   Final    NO ACID FAST BACILLI SEEN Performed at [facility]    Culture   Final    CULTURE WILL BE EXAMINED FOR 6 WEEKS BEFORE ISSUING A FINAL REPORT Performed at [facility]    Report Status PENDING  Incomplete  Culture, respiratory (NON-Expectorated)     Status: None   Collection Time: 08/19/14  7:53 AM  Result Value Ref Range Status   Specimen Description SPUTUM EXPECTORATED  Final   Special Requests NONE  Final   Gram Stain   Final    FEW WBC PRESENT,BOTH PMN AND MONONUCLEAR RARE SQUAMOUS EPITHELIAL CELLS PRESENT FEW GRAM POSITIVE COCCI IN PAIRS IN CLUSTERS RARE GRAM NEGATIVE RODS Performed at [facility]    Culture   Final    NORMAL OROPHARYNGEAL FLORA Performed at [facility]    Report Status 08/21/2014 FINAL  Final  AFB culture with smear     Status: None (Preliminary result)   Collection Time: 08/20/14  8:12 AM  Result Value Ref Range Status   Specimen Description TRACHEAL SITE  Final   Special Requests Normal  Final   Acid Fast Smear   Final    NO ACID FAST BACILLI SEEN Performed at [facility]    Culture   Final    CULTURE WILL BE EXAMINED FOR 6 WEEKS BEFORE ISSUING A FINAL REPORT Performed at [facility]    Report Status PENDING  Incomplete   Studies/Results: Ct Chest W Contrast  08/21/2014   CLINICAL DATA:  Chest pain, cavitary pneumonia  EXAM: CT CHEST WITH CONTRAST  TECHNIQUE: Multidetector CT imaging of the chest was performed during intravenous contrast administration.  CONTRAST:  44m OMNIPAQUE IOHEXOL 300 MG/ML  SOLN  COMPARISON:  Chest radiographs dated 08/18/2014  FINDINGS: 5.7 x 7.7 cm opacity in the posterior  right upper lobe and extending into the superior aspect of the right lower lobe (series 5/ image 37). Associated central necrosis and fluid and  gas, compatible with underlying lung abscess, measuring 3.3 x 4.4 x 4.5 cm.  Two 4 mm nodules anteriorly in the right upper lobe are likely infectious (series 3/image 18 and 21). The lungs are otherwise clear. No pleural effusion or pneumothorax.  Visualized thyroid is unremarkable.  The heart is normal in size.  No pericardial effusion.  1.8 cm short axis right hilar node.  Visualized upper abdomen is unremarkable.  Visualized osseous structures are within normal limits.  IMPRESSION: Right upper and lower lobe cavitary/necrotic pneumonia with underlying lung abscess, as above.   Electronically Signed   By: Julian Hy M.D.   On: 08/21/2014 19:53    Medications:  Prior to Admission:  Prescriptions prior to admission  Medication Sig Dispense Refill Last Dose  . ARIPiprazole (ABILIFY MAINTENA) 400 MG SUSR Inject 400 mg into the muscle every 30 (thirty) days. Due to start 08/13/2014     . ARIPiprazole (ABILIFY) 15 MG tablet Take 15 mg by mouth daily. Started 08/10/14 for 14 days   08/15/2014 at Unknown time  . levofloxacin (LEVAQUIN) 750 MG tablet Take 750 mg by mouth daily. Started 08/11/14 for 7 days   08/15/2014 at Unknown time   Scheduled: . antiseptic oral rinse  7 mL Mouth Rinse BID  . ARIPiprazole  15 mg Oral Daily  . azithromycin  500 mg Intravenous Q24H  . benzonatate  100 mg Oral TID  . cefTRIAXone (ROCEPHIN)  IV  1 g Intravenous Q24H  . enoxaparin (LOVENOX) injection  40 mg Subcutaneous Q24H  . Influenza vac split quadrivalent PF  0.5 mL Intramuscular Tomorrow-1000  . pneumococcal 23 valent vaccine  0.5 mL Intramuscular Tomorrow-1000  . vancomycin  750 mg Intravenous Q8H   Continuous: . 0.9 % NaCl with KCl 20 mEq / L 70 mL/hr at 08/21/14 1907   VCB:[id number], HYDROcodone-acetaminophen  Assesment: He has cavitary pneumonia that I think  is probably Staphylococcus. He had some symptoms suggestive of tuberculosis and TB is being ruled out. I do not think his diagnosis is tuberculosis but I agree this needs to be eliminated as a diagnostic possibility he has schizophrenia. He had systemic inflammatory response syndrome on admission which is much improved. CT shows what may be lung abscess. Initial sputum Gram stain looked like it might be Staphylococcus but full report shows normal oral pharyngeal flora Principal Problem:   Cavitary pneumonia Active Problems:   Schizoaffective schizophrenia   SIRS (systemic inflammatory response syndrome)    Plan: Since his CT shows lung abscess he will need to be treated until this has cleared. Lung abscess can be from Staphylococcus but could also be from mixed flora from aspiration.    LOS: 7 days   Monee Dembeck L 08/22/2014, 9:40 AM

## 2014-08-22 NOTE — Plan of Care (Signed)
Problem: Phase I Progression Outcomes Goal: Consider pulmonary consult Outcome: Completed/Met Date Met:  08/22/14 Dr.Hawkins saw pt

## 2014-08-22 NOTE — Progress Notes (Signed)
  PROGRESS NOTE  ROYE GUSTAFSON JHE:[id number] DOB: 10-10-1980 DOA: 08/15/2014 PCP: [facility]  Summary: 33 year old man treated for pneumonia as an outpatient with Levaquin. Admitted for failure of outpatient therapy. Subsequent imaging revealed cavitary pneumonia. He was ruled out for TB. Staff pneumonia is suspected.  Assessment/Plan: 1. Cavitary pneumonia with abscess, with suspected sepsis on admission. AFB negative 3. Presumed Staphylococcus pneumonia. HIV nonreactive. Appreciate pulmonology recs. 2. Schizophrenia. Stable. Continue Abilify. 3. Hyponatremia. Resolved with fluids.  4. Thrombocytosis. Likely secondary to infection. 5. Tobacco dependence.   Overall appears to be improving rapidly. Will discuss with pulmonology tomorrow. Third AFB negative. Hopefully can go home in the next 1-2 days.  Code Status: Full code DVT prophylaxis: SCDs Family Communication: none  Disposition Plan: home  Murray Hodgkins, MD  [facility]  Pager [phone] If 7PM-7AM, please contact night-coverage at [email or web address], password [facility] 08/22/2014, 5:29 PM  LOS: 7 days   Consultants:  Pulmonology   Procedures:    Antibiotics:  Azithromycin 12/13 >> 12/20  Ceftriaxone 12/13 >>  Vancomycin 12/13 >>  HPI/Subjective: Feeling much better.  Objective: Filed Vitals:   08/21/14 1417 08/21/14 2105 08/22/14 0625 08/22/14 1424  BP: 121/81 124/85 107/65 113/64  Pulse: 85 63 77 101  Temp: 98.5 F (36.9 C) 97.9 F (36.6 C) 97.5 F (36.4 C) 98.7 F (37.1 C)  TempSrc: Oral Oral Oral Oral  Resp: 20 16 16 18   Height:      Weight:      SpO2: 99% 99% 98% 98%    Intake/Output Summary (Last 24 hours) at 08/22/14 1729 Last data filed at 08/22/14 1300  Gross per 24 hour  Intake   1680 ml  Output      0 ml  Net   1680 ml     Filed Weights   08/15/14 2340 08/16/14 0325  Weight: 58.968 kg (130 lb) 58.32 kg (128 lb 9.2 oz)    Exam:     Afebrile,  vital signs are stable.  General: Appears calm, comfortable. Alert. Speech fluent and clear.  Psych: Grossly normal mentation.  CV: Regular rate and rhythm. No murmur, rub or gallop. No lower extremity edema.  Respiratory: Clear to auscultation bilaterally. No wheezes, rales or rhonchi. Normal respiratory effort.  Data Reviewed:  Leukocytosis trending down. 11.9. Remainder CBC unremarkable.   Pertinent data:  Blood cultures no growth, final.  Sputum culture normal oropharyngeal flora.  CT chest 12/19: Right upper and lower lobe cavitary necrotic pneumonia with underlying lung abscess.  Scheduled Meds: . antiseptic oral rinse  7 mL Mouth Rinse BID  . ARIPiprazole  15 mg Oral Daily  . azithromycin  500 mg Intravenous Q24H  . benzonatate  100 mg Oral TID  . cefTRIAXone (ROCEPHIN)  IV  1 g Intravenous Q24H  . enoxaparin (LOVENOX) injection  40 mg Subcutaneous Q24H  . Influenza vac split quadrivalent PF  0.5 mL Intramuscular Tomorrow-1000  . pneumococcal 23 valent vaccine  0.5 mL Intramuscular Tomorrow-1000  . vancomycin  750 mg Intravenous Q8H   Continuous Infusions: . 0.9 % NaCl with KCl 20 mEq / L 70 mL/hr at 08/21/14 1907    Principal Problem:   Cavitary pneumonia Active Problems:   Schizoaffective schizophrenia   Sepsis   Tobacco dependence   Time spent 20 minutes

## 2014-08-23 LAB — BASIC METABOLIC PANEL
ANION GAP: 13 (ref 5–15)
BUN: 4 mg/dL — ABNORMAL LOW (ref 6–23)
CALCIUM: 9.3 mg/dL (ref 8.4–10.5)
CO2: 27 mEq/L (ref 19–32)
CREATININE: 0.76 mg/dL (ref 0.50–1.35)
Chloride: 101 mEq/L (ref 96–112)
GFR calc non Af Amer: >90 mL/min (ref >90)
Glucose, Bld: 90 mg/dL (ref 70–99)
Potassium: 4.2 mEq/L (ref 3.7–5.3)
Sodium: 141 mEq/L (ref 137–147)

## 2014-08-23 MED ORDER — CLINDAMYCIN HCL 300 MG PO CAPS: 300.0000 mg | ORAL_CAPSULE | Freq: Four times a day (QID) | ORAL | Status: DC

## 2014-08-23 MED ORDER — SULFAMETHOXAZOLE-TRIMETHOPRIM 800-160 MG PO TABS: 1.0000 | ORAL_TABLET | Freq: Two times a day (BID) | ORAL | Status: DC

## 2014-08-23 MED ORDER — AMOXICILLIN-POT CLAVULANATE 875-125 MG PO TABS: 1.0000 | ORAL_TABLET | Freq: Two times a day (BID) | ORAL | Status: DC

## 2014-08-23 MED ORDER — CLINDAMYCIN HCL 150 MG PO CAPS: 300.0000 mg | ORAL_CAPSULE | Freq: Four times a day (QID) | ORAL | Status: DC

## 2014-08-23 NOTE — Progress Notes (Signed)
Dc instructions reviewed with patient.  Verbalized understanding.  Pt dc'd to home with mother. Schonewitz, Marliss Czar Anne12/21/2015

## 2014-08-23 NOTE — Progress Notes (Signed)
  PROGRESS NOTE  Jeffrey Coleman:[id number] DOB: 14-Jun-1981 DOA: 08/15/2014 PCP: [facility]  Summary: 33 year old man treated for pneumonia as an outpatient with Levaquin. Admitted for failure of outpatient therapy. Subsequent imaging revealed cavitary pneumonia. He was ruled out for TB. Staff pneumonia is suspected.  Assessment/Plan: 1. Cavitary pneumonia with abscess, with suspected sepsis on admission. Much improved. AFB negative 3. Presumed Staphylococcus pneumonia. HIV nonreactive. Appreciate pulmonology recs. 2. Schizophrenia. Stable. Continue Abilify. 3. Hyponatremia. Resolved with fluids.  4. Thrombocytosis. Likely secondary to infection. 5. Tobacco dependence.   Overall appears to be improving rapidly. Plan home today.   Discussed plan of care Dr. Luan Pulling, per our discussion we will discharge on monotherapy with clindamycin. Will arrange outpatient follow-up with pulmonology to ensure resolution.  Code Status: Full code DVT prophylaxis: SCDs Family Communication: none  Disposition Plan: home  Murray Hodgkins, MD  [facility]  Pager [phone] If 7PM-7AM, please contact night-coverage at [email or web address], password [facility] 08/23/2014, 3:12 PM  LOS: 8 days   Consultants:  Pulmonology   Procedures:    Antibiotics:  Clindamycin 12/21 >> minimum 2 weeks. Final duration based on chest x-ray findings and clinical course.  Azithromycin 12/13 >> 12/20  Ceftriaxone 12/13 >> 12/20  Vancomycin 12/13 >> 12/21  HPI/Subjective: Feels good. Ambulating without difficulty. Ready to go home.  Objective: Filed Vitals:   08/22/14 0625 08/22/14 1424 08/22/14 2006 08/23/14 0559  BP: 107/65 113/64 120/83 109/79  Pulse: 77 101 93 82  Temp: 97.5 F (36.4 C) 98.7 F (37.1 C) 98.7 F (37.1 C) 98.8 F (37.1 C)  TempSrc: Oral Oral Oral Oral  Resp: 16 18 18 18   Height:      Weight:      SpO2: 98% 98% 97% 98%    Intake/Output Summary (Last 24  hours) at 08/23/14 1512 Last data filed at 08/23/14 2694  Gross per 24 hour  Intake  885.5 ml  Output      0 ml  Net  885.5 ml     Filed Weights   08/15/14 2340 08/16/14 0325  Weight: 58.968 kg (130 lb) 58.32 kg (128 lb 9.2 oz)    Exam:     Afebrile, vital signs stable. No hypoxia.  Appears calm, comfortable. Ambulating.  Cardiovascular regular rate and rhythm. No murmur, rub or gallop.  Respiratory clear to auscultation bilaterally. No wheezes, rales or rhonchi. Normal respiratory effort.  Data Reviewed:  Basic metabolic panel unremarkable  Pertinent data:  Blood cultures no growth, final.  Sputum culture normal oropharyngeal flora.  CT chest 12/19: Right upper and lower lobe cavitary necrotic pneumonia with underlying lung abscess.  Scheduled Meds: . antiseptic oral rinse  7 mL Mouth Rinse BID  . ARIPiprazole  15 mg Oral Daily  . benzonatate  100 mg Oral TID  . enoxaparin (LOVENOX) injection  40 mg Subcutaneous Q24H  . vancomycin  750 mg Intravenous Q8H   Continuous Infusions:    Principal Problem:   Cavitary pneumonia Active Problems:   Schizoaffective schizophrenia   Sepsis   Tobacco dependence

## 2014-08-23 NOTE — Discharge Summary (Signed)
Physician Discharge Summary  Jeffrey Coleman NAT:[id number] DOB: 09-Apr-1981 DOA: 08/15/2014  PCP: [facility]  Admit date: 08/15/2014 Discharge date: 08/23/2014  Recommendations for Outpatient Follow-up:  1. Resolution of lung abscess and cavitary pneumonia. 2. Recommend minimum 2 weeks antibiotics and chest x-ray prior to discontinuation.  3. Recommend smoking cessation.   Follow-up Information    Follow up with [facility]. Schedule an appointment as soon as possible for a visit in 1 week.   Contact information:   [address] [place] [address] [phone]       Follow up with HAWKINS,EDWARD L, MD In 10 days.   Specialty:  Pulmonary Disease   Contact information:   [place] [address] [phone]      Discharge Diagnoses:  1. Cavitary pneumonia with lung abscess  2. Suspected sepsis on admission 3. Thrombocytosis secondary to infection. 4. Hyponatremia 5. Schizophrenia 6. Tobacco dependence  Discharge Condition: Improved Disposition: Home  Diet recommendation: Regular  Filed Weights   08/15/14 2340 08/16/14 0325  Weight: 58.968 kg (130 lb) 58.32 kg (128 lb 9.2 oz)    History of present illness:  33 year old man treated for pneumonia as an outpatient with Levaquin. Admitted for failure of outpatient therapy.   Hospital Course:  Subsequent imaging revealed cavitary pneumonia. He was ruled out for TB. He was seen in consultation with pulmonology. Staph pneumonia is suspected. He has remained afebrile without hypoxia. He has been cleared for discharge and will follow-up as an outpatient to ensure resolution of abscess. Individual issues as below.  1. Cavitary pneumonia with abscess, with suspected sepsis on admission. Much improved. AFB negative 3. Presumed Staphylococcus pneumonia. HIV nonreactive. Appreciate pulmonology recs. 2. Schizophrenia. Stable. Continue  Abilify. 3. Hyponatremia. Resolved with fluids.  4. Thrombocytosis. Likely secondary to infection. 5. Tobacco dependence.   Discussed plan of care Dr. Luan Pulling, per our discussion we will discharge on monotherapy with clindamycin. Will arrange outpatient follow-up with pulmonology to ensure resolution.  Consultants:  Pulmonology  Procedures:    Antibiotics:  Clindamycin 12/21 >> minimum 2 weeks. Final duration based on chest x-ray findings and clinical course.  Azithromycin 12/13 >> 12/20  Ceftriaxone 12/13 >> 12/20  Vancomycin 12/13 >> 12/21  Discharge Instructions  Discharge Instructions    Activity as tolerated - No restrictions    Complete by:  As directed      Diet general    Complete by:  As directed      Discharge instructions    Complete by:  As directed   Call physician or seek immediate medical attention for fever, difficulty breathing or worsening of condition.          Current Discharge Medication List    START taking these medications   Details  clindamycin (CLEOCIN) 300 MG capsule Take 1 capsule (300 mg total) by mouth every 6 (six) hours. Qty: 120 capsule, Refills: 0      CONTINUE these medications which have NOT CHANGED   Details  ARIPiprazole (ABILIFY MAINTENA) 400 MG SUSR Inject 400 mg into the muscle every 30 (thirty) days. Due to start 08/13/2014    ARIPiprazole (ABILIFY) 15 MG tablet Take 15 mg by mouth daily. Started 08/10/14 for 14 days      STOP taking these medications     levofloxacin (LEVAQUIN) 750 MG tablet        No Known Allergies  The results of significant diagnostics from this hospitalization (  including imaging, microbiology, ancillary and laboratory) are listed below for reference.    Significant Diagnostic Studies: Dg Chest 2 View  08/18/2014   CLINICAL DATA:  Followup pneumonia  EXAM: CHEST  2 VIEW  COMPARISON:  08/16/2014  FINDINGS: Dense consolidation extends from the superior right hilum throughout much of the  right upper lobe. There is central air with small air-fluid levels consistent with cavitation pneumonia.  The overall extent of the lung consolidation is similar to the prior exam. The amount of air within the presumed cavity has increased.  Remainder of the lungs is clear. No pleural effusion. No pneumothorax.  Cardiac silhouette is normal in size. No mediastinal or left hilar mass or adenopathy.  IMPRESSION: 1. Persistent right upper lobe pneumonia. Air within the pneumonia has increased, and there are now small dependent air-fluid levels, new from the prior exam, findings consistent with necrosis and cavitation. No new areas of lung infection. No other change.   Electronically Signed   By: Lajean Manes M.D.   On: 08/18/2014 09:48   Dg Chest 2 View  08/16/2014   CLINICAL DATA:  Cough, fever.  EXAM: CHEST  2 VIEW  COMPARISON:  August 11, 2014.  FINDINGS: The heart size and mediastinal contours are within normal limits. No pneumothorax or pleural effusion is noted. Left lung is clear. Right upper lobe opacity is significantly increased consistent with worsening pneumonia. The visualized skeletal structures are unremarkable.  IMPRESSION: Worsening right upper lobe pneumonia. Followup radiographs are recommended until resolution.   Electronically Signed   By: Sabino Dick M.D.   On: 08/16/2014 01:18   Ct Chest W Contrast  08/21/2014   CLINICAL DATA:  Chest pain, cavitary pneumonia  EXAM: CT CHEST WITH CONTRAST  TECHNIQUE: Multidetector CT imaging of the chest was performed during intravenous contrast administration.  CONTRAST:  24mL OMNIPAQUE IOHEXOL 300 MG/ML  SOLN  COMPARISON:  Chest radiographs dated 08/18/2014  FINDINGS: 5.7 x 7.7 cm opacity in the posterior right upper lobe and extending into the superior aspect of the right lower lobe (series 5/ image 37). Associated central necrosis and fluid and gas, compatible with underlying lung abscess, measuring 3.3 x 4.4 x 4.5 cm.  Two 4 mm nodules anteriorly  in the right upper lobe are likely infectious (series 3/image 18 and 21). The lungs are otherwise clear. No pleural effusion or pneumothorax.  Visualized thyroid is unremarkable.  The heart is normal in size.  No pericardial effusion.  1.8 cm short axis right hilar node.  Visualized upper abdomen is unremarkable.  Visualized osseous structures are within normal limits.  IMPRESSION: Right upper and lower lobe cavitary/necrotic pneumonia with underlying lung abscess, as above.   Electronically Signed   By: Julian Hy M.D.   On: 08/21/2014 19:53    Microbiology: Recent Results (from the past 240 hour(s))  Culture, blood (routine x 2) Call MD if unable to obtain prior to antibiotics being given     Status: None   Collection Time: 08/16/14  4:23 AM  Result Value Ref Range Status   Specimen Description BLOOD RIGHT ARM  Final   Special Requests BOTTLES DRAWN AEROBIC AND ANAEROBIC [place]  Final   Culture NO GROWTH 5 DAYS  Final   Report Status 08/21/2014 FINAL  Final  Culture, blood (routine x 2) Call MD if unable to obtain prior to antibiotics being given     Status: None   Collection Time: 08/16/14  4:34 AM  Result Value Ref Range Status  Specimen Description BLOOD RIGHT HAND  Final   Special Requests BOTTLES DRAWN AEROBIC AND ANAEROBIC 6CC  Final   Culture NO GROWTH 5 DAYS  Final   Report Status 08/21/2014 FINAL  Final  Culture, sputum-assessment     Status: None   Collection Time: 08/19/14  7:53 AM  Result Value Ref Range Status   Specimen Description SPUTUM EXPECTORATED  Final   Special Requests NONE  Final   Sputum evaluation   Final    THIS SPECIMEN IS ACCEPTABLE. RESPIRATORY CULTURE REPORT TO FOLLOW. Performed at [facility]    Report Status 08/19/2014 FINAL  Final  AFB culture with smear     Status: None (Preliminary result)   Collection Time: 08/19/14  7:53 AM  Result Value Ref Range Status   Specimen Description SPUTUM EXPECTORATED  Final   Special Requests NONE   Final   Acid Fast Smear   Final    NO ACID FAST BACILLI SEEN Performed at [facility]    Culture   Final    CULTURE WILL BE EXAMINED FOR 6 WEEKS BEFORE ISSUING A FINAL REPORT Performed at [facility]    Report Status PENDING  Incomplete  Culture, respiratory (NON-Expectorated)     Status: None   Collection Time: 08/19/14  7:53 AM  Result Value Ref Range Status   Specimen Description SPUTUM EXPECTORATED  Final   Special Requests NONE  Final   Gram Stain   Final    FEW WBC PRESENT,BOTH PMN AND MONONUCLEAR RARE SQUAMOUS EPITHELIAL CELLS PRESENT FEW GRAM POSITIVE COCCI IN PAIRS IN CLUSTERS RARE GRAM NEGATIVE RODS Performed at [facility]    Culture   Final    NORMAL OROPHARYNGEAL FLORA Performed at [facility]    Report Status 08/21/2014 FINAL  Final  AFB culture with smear     Status: None (Preliminary result)   Collection Time: 08/20/14  8:12 AM  Result Value Ref Range Status   Specimen Description TRACHEAL SITE  Final   Special Requests Normal  Final   Acid Fast Smear   Final    NO ACID FAST BACILLI SEEN Performed at [facility]    Culture   Final    CULTURE WILL BE EXAMINED FOR 6 WEEKS BEFORE ISSUING A FINAL REPORT Performed at [facility]    Report Status PENDING  Incomplete  AFB culture with smear     Status: None (Preliminary result)   Collection Time: 08/21/14  3:46 AM  Result Value Ref Range Status   Specimen Description SPU  Final   Special Requests Normal  Final   Acid Fast Smear   Final    NO ACID FAST BACILLI SEEN Performed at [facility]    Culture   Final    CULTURE WILL BE EXAMINED FOR 6 WEEKS BEFORE ISSUING A FINAL REPORT Performed at [facility]    Report Status PENDING  Incomplete     Labs: Basic Metabolic Panel:  Recent Labs Lab 08/17/14 0544 08/19/14 0620 08/21/14 0606 08/23/14 0601  NA 138 140 138 141  K 4.3 4.5 4.7 4.2  CL 99 102 99 101  CO2 27 26 27 27    GLUCOSE 103* 99 96 90  BUN 4* 3* 4* 4*  CREATININE 0.83 0.75 0.76 0.76  CALCIUM 9.1 9.4 9.5 9.3   CBC:  Recent Labs Lab 08/17/14 0544 08/18/14 0617 08/19/14 0620 08/21/14 0606 08/22/14 0555  WBC 15.1* 11.8* 11.0* 14.1* 11.9*  HGB  14.5 14.9 14.3 15.0 14.4  HCT 40.2 42.1 39.9 42.3 41.4  MCV 86.5 87.7 87.1 87.8 87.9  PLT 409* 435* 428* 450* 462*    Principal Problem:   Cavitary pneumonia Active Problems:   Schizoaffective schizophrenia   Sepsis   Tobacco dependence   Time coordinating discharge: 35 minutes  Signed:  Murray Hodgkins, MD [facility] 08/23/2014, 3:31 PM

## 2014-08-23 NOTE — Progress Notes (Signed)
ANTIBIOTIC CONSULT NOTE - FOLLOW UP  Pharmacy Consult for Vancomycin Indication: Cavitary Pneumonia  No Known Allergies  Patient Measurements: Height: 5\' 4"  (162.6 cm) Weight: 128 lb 9.2 oz (58.32 kg) IBW/kg (Calculated) : 59.2  Vital Signs: Temp: 98.8 F (37.1 C) (12/21 0559) Temp Source: Oral (12/21 0559) BP: 109/79 mmHg (12/21 0559) Pulse Rate: 82 (12/21 0559) Intake/Output from previous day: 12/20 0701 - 12/21 0700 In: 3267.3 [P.O.:720; I.V.:1647.3; IV Piggyback:900] Out: -  Intake/Output from this shift:    Labs:  Recent Labs  08/21/14 0606 08/22/14 0555 08/23/14 0601  WBC 14.1* 11.9*  --   HGB 15.0 14.4  --   PLT 450* 462*  --   CREATININE 0.76  --  0.76   Estimated Creatinine Clearance: 108.3 mL/min (by C-G formula based on Cr of 0.76).  Recent Labs  08/20/14 1714  VANCOTROUGH 5.3*    Microbiology: Recent Results (from the past 720 hour(s))  Culture, blood (routine x 2) Call MD if unable to obtain prior to antibiotics being given     Status: None   Collection Time: 08/16/14  4:23 AM  Result Value Ref Range Status   Specimen Description BLOOD RIGHT ARM  Final   Special Requests BOTTLES DRAWN AEROBIC AND ANAEROBIC 6CC  Final   Culture NO GROWTH 5 DAYS  Final   Report Status 08/21/2014 FINAL  Final  Culture, blood (routine x 2) Call MD if unable to obtain prior to antibiotics being given     Status: None   Collection Time: 08/16/14  4:34 AM  Result Value Ref Range Status   Specimen Description BLOOD RIGHT HAND  Final   Special Requests BOTTLES DRAWN AEROBIC AND ANAEROBIC 6CC  Final   Culture NO GROWTH 5 DAYS  Final   Report Status 08/21/2014 FINAL  Final  Culture, sputum-assessment     Status: None   Collection Time: 08/19/14  7:53 AM  Result Value Ref Range Status   Specimen Description SPUTUM EXPECTORATED  Final   Special Requests NONE  Final   Sputum evaluation   Final    THIS SPECIMEN IS ACCEPTABLE. RESPIRATORY CULTURE REPORT TO  FOLLOW. Performed at [facility]    Report Status 08/19/2014 FINAL  Final  AFB culture with smear     Status: None (Preliminary result)   Collection Time: 08/19/14  7:53 AM  Result Value Ref Range Status   Specimen Description SPUTUM EXPECTORATED  Final   Special Requests NONE  Final   Acid Fast Smear   Final    NO ACID FAST BACILLI SEEN Performed at [facility]    Culture   Final    CULTURE WILL BE EXAMINED FOR 6 WEEKS BEFORE ISSUING A FINAL REPORT Performed at [facility]    Report Status PENDING  Incomplete  Culture, respiratory (NON-Expectorated)     Status: None   Collection Time: 08/19/14  7:53 AM  Result Value Ref Range Status   Specimen Description SPUTUM EXPECTORATED  Final   Special Requests NONE  Final   Gram Stain   Final    FEW WBC PRESENT,BOTH PMN AND MONONUCLEAR RARE SQUAMOUS EPITHELIAL CELLS PRESENT FEW GRAM POSITIVE COCCI IN PAIRS IN CLUSTERS RARE GRAM NEGATIVE RODS Performed at [facility]    Culture   Final    NORMAL OROPHARYNGEAL FLORA Performed at [facility]    Report Status 08/21/2014 FINAL  Final  AFB culture with smear     Status: None (Preliminary result)  Collection Time: 08/20/14  8:12 AM  Result Value Ref Range Status   Specimen Description TRACHEAL SITE  Final   Special Requests Normal  Final   Acid Fast Smear   Final    NO ACID FAST BACILLI SEEN Performed at [facility]    Culture   Final    CULTURE WILL BE EXAMINED FOR 6 WEEKS BEFORE ISSUING A FINAL REPORT Performed at [facility]    Report Status PENDING  Incomplete  AFB culture with smear     Status: None (Preliminary result)   Collection Time: 08/21/14  3:46 AM  Result Value Ref Range Status   Specimen Description SPU  Final   Special Requests Normal  Final   Acid Fast Smear   Final    NO ACID FAST BACILLI SEEN Performed at [facility]    Culture   Final    CULTURE WILL BE EXAMINED FOR 6 WEEKS BEFORE  ISSUING A FINAL REPORT Performed at [facility]    Report Status PENDING  Incomplete    Anti-infectives    Start     Dose/Rate Route Frequency Ordered Stop   08/21/14 0300  vancomycin (VANCOCIN) IVPB 750 mg/150 ml premix     750 mg150 mL/hr over 60 Minutes Intravenous Every 8 hours 08/20/14 1830     08/16/14 2200  cefTRIAXone (ROCEPHIN) 1 g in dextrose 5 % 50 mL IVPB - Premix     1 g100 mL/hr over 30 Minutes Intravenous Every 24 hours 08/16/14 0349 08/22/14 2250   08/16/14 2100  azithromycin (ZITHROMAX) 500 mg in dextrose 5 % 250 mL IVPB  Status:  Discontinued     500 mg250 mL/hr over 60 Minutes Intravenous Every 24 hours 08/16/14 0349 08/22/14 1830   08/16/14 1800  vancomycin (VANCOCIN) IVPB 750 mg/150 ml premix  Status:  Discontinued     750 mg150 mL/hr over 60 Minutes Intravenous Every 12 hours 08/16/14 0224 08/20/14 1830   08/16/14 0300  vancomycin (VANCOCIN) IVPB 1000 mg/200 mL premix     1,000 mg200 mL/hr over 60 Minutes Intravenous  Once 08/16/14 0224 08/16/14 0600   08/16/14 0130  cefTRIAXone (ROCEPHIN) 1 g in dextrose 5 % 50 mL IVPB     1 g100 mL/hr over 30 Minutes Intravenous  Once 08/16/14 0129 08/16/14 0215   08/16/14 0130  azithromycin (ZITHROMAX) 500 mg in dextrose 5 % 250 mL IVPB     500 mg250 mL/hr over 60 Minutes Intravenous  Once 08/16/14 0129 08/16/14 0315     Assessment: Okay for Protocol, trough below goal when checked and Vancomycin adjusted. Pt has ruled out for TB.  Dr Luan Pulling note and discharge plan noted.    Goal of Therapy:  Vancomycin trough level 15-20 mcg/ml  Plan:  Vancomycin to 750mg  IV every 8 hrs. Deescalate to PO abx when appropriate (see Dr Luan Pulling note) Duration of therapy per MD (anticipate 14 days) Measure antibiotic drug levels at steady state Follow up culture results  Hart Robinsons A 08/23/2014,8:45 AM

## 2014-08-23 NOTE — Progress Notes (Signed)
Subjective: He now has 3 negative AFB smears. He says he feels okay. He has no new complaints.  Objective: Vital signs in last 24 hours: Temp:  [98.7 F (37.1 C)-98.8 F (37.1 C)] 98.8 F (37.1 C) (12/21 0559) Pulse Rate:  [82-101] 82 (12/21 0559) Resp:  [18] 18 (12/21 0559) BP: (109-120)/(64-83) 109/79 mmHg (12/21 0559) SpO2:  [97 %-98 %] 98 % (12/21 0559) Weight change:  Last BM Date: 08/22/14  Intake/Output from previous day: 12/20 0701 - 12/21 0700 In: 3267.3 [P.O.:720; I.V.:1647.3; IV Piggyback:900] Out: -   PHYSICAL EXAM General appearance: alert, cooperative and no distress Resp: clear to auscultation bilaterally Cardio: regular rate and rhythm, S1, S2 normal, no murmur, click, rub or gallop GI: soft, non-tender; bowel sounds normal; no masses,  no organomegaly Extremities: extremities normal, atraumatic, no cyanosis or edema  Lab Results:  Results for orders placed or performed during the hospital encounter of 08/15/14 (from the past 48 hour(s))  CBC     Status: Abnormal   Collection Time: 08/22/14  5:55 AM  Result Value Ref Range   WBC 11.9 (H) 4.0 - 10.5 K/uL   RBC 4.71 4.22 - 5.81 MIL/uL   Hemoglobin 14.4 13.0 - 17.0 g/dL   HCT 41.4 39.0 - 52.0 %   MCV 87.9 78.0 - 100.0 fL   MCH 30.6 26.0 - 34.0 pg   MCHC 34.8 30.0 - 36.0 g/dL   RDW 13.0 11.5 - 15.5 %   Platelets 462 (H) 150 - 400 K/uL  Basic metabolic panel     Status: Abnormal   Collection Time: 08/23/14  6:01 AM  Result Value Ref Range   Sodium 141 137 - 147 mEq/L   Potassium 4.2 3.7 - 5.3 mEq/L   Chloride 101 96 - 112 mEq/L   CO2 27 19 - 32 mEq/L   Glucose, Bld 90 70 - 99 mg/dL   BUN 4 (L) 6 - 23 mg/dL   Creatinine, Ser 0.76 0.50 - 1.35 mg/dL   Calcium 9.3 8.4 - 10.5 mg/dL   GFR calc non Af Amer >90 >90 mL/min   GFR calc Af Amer >90 >90 mL/min    Comment: (NOTE) The eGFR has been calculated using the CKD EPI equation. This calculation has not been validated in all clinical situations. eGFR's  persistently <90 mL/min signify possible Chronic Kidney Disease.    Anion gap 13 5 - 15    ABGS No results for input(s): PHART, PO2ART, TCO2, HCO3 in the last 72 hours.  Invalid input(s): PCO2 CULTURES Recent Results (from the past 240 hour(s))  Culture, blood (routine x 2) Call MD if unable to obtain prior to antibiotics being given     Status: None   Collection Time: 08/16/14  4:23 AM  Result Value Ref Range Status   Specimen Description BLOOD RIGHT ARM  Final   Special Requests BOTTLES DRAWN AEROBIC AND ANAEROBIC [place]  Final   Culture NO GROWTH 5 DAYS  Final   Report Status 08/21/2014 FINAL  Final  Culture, blood (routine x 2) Call MD if unable to obtain prior to antibiotics being given     Status: None   Collection Time: 08/16/14  4:34 AM  Result Value Ref Range Status   Specimen Description BLOOD RIGHT HAND  Final   Special Requests BOTTLES DRAWN AEROBIC AND ANAEROBIC [place]  Final   Culture NO GROWTH 5 DAYS  Final   Report Status 08/21/2014 FINAL  Final  Culture, sputum-assessment     Status: None  Collection Time: 08/19/14  7:53 AM  Result Value Ref Range Status   Specimen Description SPUTUM EXPECTORATED  Final   Special Requests NONE  Final   Sputum evaluation   Final    THIS SPECIMEN IS ACCEPTABLE. RESPIRATORY CULTURE REPORT TO FOLLOW. Performed at [facility]    Report Status 08/19/2014 FINAL  Final  AFB culture with smear     Status: None (Preliminary result)   Collection Time: 08/19/14  7:53 AM  Result Value Ref Range Status   Specimen Description SPUTUM EXPECTORATED  Final   Special Requests NONE  Final   Acid Fast Smear   Final    NO ACID FAST BACILLI SEEN Performed at [facility]    Culture   Final    CULTURE WILL BE EXAMINED FOR 6 WEEKS BEFORE ISSUING A FINAL REPORT Performed at [facility]    Report Status PENDING  Incomplete  Culture, respiratory (NON-Expectorated)     Status: None   Collection Time: 08/19/14  7:53 AM   Result Value Ref Range Status   Specimen Description SPUTUM EXPECTORATED  Final   Special Requests NONE  Final   Gram Stain   Final    FEW WBC PRESENT,BOTH PMN AND MONONUCLEAR RARE SQUAMOUS EPITHELIAL CELLS PRESENT FEW GRAM POSITIVE COCCI IN PAIRS IN CLUSTERS RARE GRAM NEGATIVE RODS Performed at [facility]    Culture   Final    NORMAL OROPHARYNGEAL FLORA Performed at [facility]    Report Status 08/21/2014 FINAL  Final  AFB culture with smear     Status: None (Preliminary result)   Collection Time: 08/20/14  8:12 AM  Result Value Ref Range Status   Specimen Description TRACHEAL SITE  Final   Special Requests Normal  Final   Acid Fast Smear   Final    NO ACID FAST BACILLI SEEN Performed at [facility]    Culture   Final    CULTURE WILL BE EXAMINED FOR 6 WEEKS BEFORE ISSUING A FINAL REPORT Performed at [facility]    Report Status PENDING  Incomplete  AFB culture with smear     Status: None (Preliminary result)   Collection Time: 08/21/14  3:46 AM  Result Value Ref Range Status   Specimen Description SPU  Final   Special Requests Normal  Final   Acid Fast Smear   Final    NO ACID FAST BACILLI SEEN Performed at [facility]    Culture   Final    CULTURE WILL BE EXAMINED FOR 6 WEEKS BEFORE ISSUING A FINAL REPORT Performed at [facility]    Report Status PENDING  Incomplete   Studies/Results: Ct Chest W Contrast  08/21/2014   CLINICAL DATA:  Chest pain, cavitary pneumonia  EXAM: CT CHEST WITH CONTRAST  TECHNIQUE: Multidetector CT imaging of the chest was performed during intravenous contrast administration.  CONTRAST:  75mL OMNIPAQUE IOHEXOL 300 MG/ML  SOLN  COMPARISON:  Chest radiographs dated 08/18/2014  FINDINGS: 5.7 x 7.7 cm opacity in the posterior right upper lobe and extending into the superior aspect of the right lower lobe (series 5/ image 37). Associated central necrosis and fluid and gas, compatible with  underlying lung abscess, measuring 3.3 x 4.4 x 4.5 cm.  Two 4 mm nodules anteriorly in the right upper lobe are likely infectious (series 3/image 18 and 21). The lungs are otherwise clear. No pleural effusion or pneumothorax.  Visualized thyroid is unremarkable.  The heart is normal  in size.  No pericardial effusion.  1.8 cm short axis right hilar node.  Visualized upper abdomen is unremarkable.  Visualized osseous structures are within normal limits.  IMPRESSION: Right upper and lower lobe cavitary/necrotic pneumonia with underlying lung abscess, as above.   Electronically Signed   By: Julian Hy M.D.   On: 08/21/2014 19:53    Medications:  Prior to Admission:  Prescriptions prior to admission  Medication Sig Dispense Refill Last Dose  . ARIPiprazole (ABILIFY MAINTENA) 400 MG SUSR Inject 400 mg into the muscle every 30 (thirty) days. Due to start 08/13/2014     . ARIPiprazole (ABILIFY) 15 MG tablet Take 15 mg by mouth daily. Started 08/10/14 for 14 days   08/15/2014 at Unknown time  . levofloxacin (LEVAQUIN) 750 MG tablet Take 750 mg by mouth daily. Started 08/11/14 for 7 days   08/15/2014 at Unknown time   Scheduled: . antiseptic oral rinse  7 mL Mouth Rinse BID  . ARIPiprazole  15 mg Oral Daily  . benzonatate  100 mg Oral TID  . enoxaparin (LOVENOX) injection  40 mg Subcutaneous Q24H  . vancomycin  750 mg Intravenous Q8H   Continuous:  PXT:GGYIRSWNI, HYDROcodone-acetaminophen  Assesment: He has cavitary pneumonia/lung abscess. He has now ruled out for TB. This is likely staph but lung abscess could be more from aspiration and be mixed flora. Principal Problem:   Cavitary pneumonia Active Problems:   Schizoaffective schizophrenia   Sepsis   Tobacco dependence    Plan: I think he is okay for discharge from a strictly pulmonary point of view and I would treat him with Augmentin plus minus Bactrim or doxycycline and he needs prolonged treatment for at least 2 weeks. He will need  chest x-ray to document clearing of the cavitary infiltrate before his antibiotics are discontinued.  I will plan to sign off at this point. Thanks for allowing me to see him with you. His x-ray could be obtained at his primary care physician's or I can follow-up on this    LOS: 8 days   Avrey Hyser L 08/23/2014, 8:32 AM

## 2014-08-24 NOTE — Progress Notes (Signed)
UR chart review completed.  

## 2014-09-13 ENCOUNTER — Encounter (HOSPITAL_COMMUNITY): Payer: Self-pay | Admitting: *Deleted

## 2014-09-13 ENCOUNTER — Emergency Department (HOSPITAL_COMMUNITY)
Admission: EM | Admit: 2014-09-13 | Discharge: 2014-09-14 | Disposition: A | Discharge status: Other Acute Inpatient Hospital | Payer: Medicare Other | Source: Home / Self Care | Attending: Emergency Medicine | Admitting: Emergency Medicine

## 2014-09-13 DIAGNOSIS — R45851 Suicidal ideations: Secondary | ICD-10-CM

## 2014-09-13 DIAGNOSIS — F200 Paranoid schizophrenia: Principal | ICD-10-CM | POA: Diagnosis present

## 2014-09-13 DIAGNOSIS — G4700 Insomnia, unspecified: Secondary | ICD-10-CM | POA: Diagnosis present

## 2014-09-13 DIAGNOSIS — F4323 Adjustment disorder with mixed anxiety and depressed mood: Secondary | ICD-10-CM | POA: Diagnosis present

## 2014-09-13 DIAGNOSIS — F17210 Nicotine dependence, cigarettes, uncomplicated: Secondary | ICD-10-CM | POA: Diagnosis present

## 2014-09-13 LAB — CBC WITH DIFFERENTIAL/PLATELET
BASOS ABS: 0 10*3/uL (ref 0.0–0.1)
BASOS PCT: 1 % (ref 0–1)
Eosinophils Absolute: 0.2 10*3/uL (ref 0.0–0.7)
Eosinophils Relative: 3 % (ref 0–5)
HCT: 38.5 % — ABNORMAL LOW (ref 39.0–52.0)
Hemoglobin: 13.7 g/dL (ref 13.0–17.0)
LYMPHS PCT: 44 % (ref 12–46)
Lymphs Abs: 2.9 10*3/uL (ref 0.7–4.0)
MCH: 31.2 pg (ref 26.0–34.0)
MCHC: 35.6 g/dL (ref 30.0–36.0)
MCV: 87.7 fL (ref 78.0–100.0)
Monocytes Absolute: 0.6 10*3/uL (ref 0.1–1.0)
Monocytes Relative: 8 % (ref 3–12)
Neutro Abs: 2.9 10*3/uL (ref 1.7–7.7)
Neutrophils Relative %: 44 % (ref 43–77)
Platelets: 275 10*3/uL (ref 150–400)
RBC: 4.39 MIL/uL (ref 4.22–5.81)
RDW: 14 % (ref 11.5–15.5)
WBC: 6.6 10*3/uL (ref 4.0–10.5)

## 2014-09-13 LAB — BASIC METABOLIC PANEL
ANION GAP: 8 (ref 5–15)
BUN: 12 mg/dL (ref 6–23)
CALCIUM: 8.7 mg/dL (ref 8.4–10.5)
CO2: 26 mmol/L (ref 19–32)
Chloride: 100 mEq/L (ref 96–112)
Creatinine, Ser: 0.9 mg/dL (ref 0.50–1.35)
GFR calc non Af Amer: >90 mL/min (ref >90)
Glucose, Bld: 71 mg/dL (ref 70–99)
POTASSIUM: 3.7 mmol/L (ref 3.5–5.1)
SODIUM: 134 mmol/L — AB (ref 135–145)

## 2014-09-13 LAB — ETHANOL

## 2014-09-13 NOTE — ED Notes (Signed)
Pt reports he is unable to provide urine sample at present.

## 2014-09-13 NOTE — ED Provider Notes (Signed)
CSN: [id number]     Arrival date & time 09/13/14  2234 History   First MD Initiated Contact with Patient 09/13/14 2300     Chief Complaint  Patient presents with  . Suicidal     (Consider location/radiation/quality/duration/timing/severity/associated sxs/prior Treatment) The history is provided by the patient.   Jeffrey Coleman is a 34 y.o. male with a history of schizophrenia and prior history of intermittent suicidal ideation presenting here voluntarily due to increased feeling of being suicidal over the past several days.  He reports he gets a "feeling" of wanting to harm himself but does not have a plan nor has he attempted himself harm.  He reports having auditory hallucinations daily which encourage violence towards others only, not himself, which he feels are under fair control currently.  He denies recent visual hallucinations.  He believes the suicidality may be from a recent change from haldol to abilify by his mental health provider.  He denies other complaint at this time.     Past Medical History  Diagnosis Date  . Schizo affective schizophrenia    History reviewed. No pertinent past surgical history. Family History  Problem Relation Age of Onset  . Cancer Father    History  Substance Use Topics  . Smoking status: Current Every Day Smoker -- 0.50 packs/day for 15 years    Types: Cigarettes  . Smokeless tobacco: Never Used  . Alcohol Use: No    Review of Systems  Constitutional: Negative for fever.  HENT: Negative for congestion and sore throat.   Eyes: Negative.   Respiratory: Negative for chest tightness and shortness of breath.   Cardiovascular: Negative for chest pain.  Gastrointestinal: Negative for nausea and abdominal pain.  Genitourinary: Negative.   Musculoskeletal: Negative for joint swelling, arthralgias and neck pain.  Skin: Negative.  Negative for rash and wound.  Neurological: Negative for dizziness, weakness, light-headedness, numbness and  headaches.  Psychiatric/Behavioral: Positive for suicidal ideas, hallucinations and behavioral problems. Negative for self-injury.      Allergies  Review of patient's allergies indicates no known allergies.  Home Medications   Prior to Admission medications   Medication Sig Start Date End Date Taking? Authorizing Provider  ARIPiprazole (ABILIFY MAINTENA) 400 MG SUSR Inject 400 mg into the muscle every 30 (thirty) days. Due to start 08/13/2014    Historical Provider, MD  ARIPiprazole (ABILIFY) 15 MG tablet Take 15 mg by mouth daily. Started 08/10/14 for 14 days    Historical Provider, MD  clindamycin (CLEOCIN) 300 MG capsule Take 1 capsule (300 mg total) by mouth every 6 (six) hours. 08/23/14   Samuella Cota, MD   There were no vitals taken for this visit. Physical Exam  Constitutional: He appears well-developed and well-nourished.  HENT:  Head: Normocephalic and atraumatic.  Eyes: Conjunctivae are normal.  Neck: Normal range of motion.  Cardiovascular: Normal rate, regular rhythm, normal heart sounds and intact distal pulses.   Pulmonary/Chest: Effort normal and breath sounds normal. He has no wheezes.  Abdominal: Soft. Bowel sounds are normal. There is no tenderness.  Musculoskeletal: Normal range of motion.  Neurological: He is alert.  Skin: Skin is warm and dry.  Psychiatric: His behavior is normal. His mood appears not anxious. His affect is blunt. His speech is not rapid and/or pressured. He is not agitated. He expresses suicidal ideation. He expresses no suicidal plans and no homicidal plans.  Nursing note and vitals reviewed.   ED Course  Procedures (including critical care time) Labs Review  Labs Reviewed  CBC WITH DIFFERENTIAL - Abnormal; Notable for the following:    HCT 38.5 (*)    All other components within normal limits  BASIC METABOLIC PANEL - Abnormal; Notable for the following:    Sodium 134 (*)    All other components within normal limits  ETHANOL   URINALYSIS, ROUTINE W REFLEX MICROSCOPIC  URINE RAPID DRUG SCREEN (HOSP PERFORMED)    Imaging Review No results found.   EKG Interpretation None      MDM   Final diagnoses:  Suicidal ideation    Pt is here voluntarily for assistance with increasing sucidal thoughts without plan.  Medical clearance labs followed by TTS eval.  Pt is here voluntarily.    Evalee Jefferson, PA-C 09/14/14 [place], MD 09/14/14 [phone]

## 2014-09-13 NOTE — ED Notes (Signed)
PA at bedside.

## 2014-09-13 NOTE — ED Notes (Signed)
Pt brought in by [facility] for c/o having suicidal thoughts

## 2014-09-14 ENCOUNTER — Inpatient Hospital Stay (HOSPITAL_COMMUNITY)
Admission: AD | Admit: 2014-09-14 | Discharge: 2014-09-15 | DRG: 885 | Disposition: A | Discharge status: Home / Self Care | Payer: Medicare Other | Source: Intra-hospital | Attending: Psychiatry | Admitting: Psychiatry

## 2014-09-14 ENCOUNTER — Encounter (HOSPITAL_COMMUNITY): Payer: Self-pay | Admitting: *Deleted

## 2014-09-14 DIAGNOSIS — F17210 Nicotine dependence, cigarettes, uncomplicated: Secondary | ICD-10-CM | POA: Diagnosis present

## 2014-09-14 DIAGNOSIS — R45851 Suicidal ideations: Secondary | ICD-10-CM | POA: Diagnosis present

## 2014-09-14 DIAGNOSIS — G4700 Insomnia, unspecified: Secondary | ICD-10-CM | POA: Diagnosis present

## 2014-09-14 DIAGNOSIS — F4323 Adjustment disorder with mixed anxiety and depressed mood: Secondary | ICD-10-CM | POA: Diagnosis present

## 2014-09-14 DIAGNOSIS — F259 Schizoaffective disorder, unspecified: Secondary | ICD-10-CM | POA: Diagnosis present

## 2014-09-14 DIAGNOSIS — F250 Schizoaffective disorder, bipolar type: Secondary | ICD-10-CM

## 2014-09-14 DIAGNOSIS — F200 Paranoid schizophrenia: Secondary | ICD-10-CM | POA: Diagnosis present

## 2014-09-14 LAB — RAPID URINE DRUG SCREEN, HOSP PERFORMED
Amphetamines: NOT DETECTED
Barbiturates: NOT DETECTED
Benzodiazepines: NOT DETECTED
COCAINE: NOT DETECTED
OPIATES: NOT DETECTED
Tetrahydrocannabinol: NOT DETECTED

## 2014-09-14 LAB — URINALYSIS, ROUTINE W REFLEX MICROSCOPIC
Bilirubin Urine: NEGATIVE
Glucose, UA: NEGATIVE mg/dL
Hgb urine dipstick: NEGATIVE
Ketones, ur: NEGATIVE mg/dL
LEUKOCYTES UA: NEGATIVE
Nitrite: NEGATIVE
Protein, ur: NEGATIVE mg/dL
Specific Gravity, Urine: 1.01 (ref 1.005–1.030)
Urobilinogen, UA: 0.2 mg/dL (ref 0.0–1.0)
pH: 6.5 (ref 5.0–8.0)

## 2014-09-14 MED ORDER — CLINDAMYCIN HCL 300 MG PO CAPS
300.0000 mg | ORAL_CAPSULE | Freq: Four times a day (QID) | ORAL | Status: DC
  Administered 2014-09-14 – 2014-09-15 (×4): 300 mg via ORAL
  Filled 2014-09-14 (×12): qty 1

## 2014-09-14 MED ORDER — MAGNESIUM HYDROXIDE 400 MG/5ML PO SUSP: 30.0000 mL | Freq: Every day | ORAL | Status: DC | PRN

## 2014-09-14 MED ORDER — TRAZODONE HCL 50 MG PO TABS
50.0000 mg | ORAL_TABLET | Freq: Every evening | ORAL | Status: DC | PRN
  Filled 2014-09-14: qty 14

## 2014-09-14 MED ORDER — HYDROXYZINE HCL 25 MG PO TABS
25.0000 mg | ORAL_TABLET | Freq: Three times a day (TID) | ORAL | Status: DC | PRN
  Filled 2014-09-14 (×2): qty 20

## 2014-09-14 MED ORDER — ARIPIPRAZOLE 15 MG PO TABS
15.0000 mg | ORAL_TABLET | Freq: Every day | ORAL | Status: DC
  Filled 2014-09-14 (×2): qty 1

## 2014-09-14 MED ORDER — ALUM & MAG HYDROXIDE-SIMETH 200-200-20 MG/5ML PO SUSP: 30.0000 mL | ORAL | Status: DC | PRN

## 2014-09-14 MED ORDER — ARIPIPRAZOLE ER 400 MG IM SUSR: 400.0000 mg | INTRAMUSCULAR | Status: DC

## 2014-09-14 MED ORDER — ARIPIPRAZOLE 5 MG PO TABS: 5.0000 mg | ORAL_TABLET | Freq: Every day | ORAL | Status: DC | PRN

## 2014-09-14 MED ORDER — ACETAMINOPHEN 325 MG PO TABS: 650.0000 mg | ORAL_TABLET | Freq: Four times a day (QID) | ORAL | Status: DC | PRN

## 2014-09-14 MED ORDER — BENZTROPINE MESYLATE 0.5 MG PO TABS
0.5000 mg | ORAL_TABLET | Freq: Every day | ORAL | Status: DC | PRN
  Filled 2014-09-14: qty 14

## 2014-09-14 MED ORDER — NON FORMULARY: 400.0000 mg | Status: DC

## 2014-09-14 NOTE — ED Notes (Signed)
Pt accepted at [facility] by Dr. Suzy Bouchard to Bed 301-1.

## 2014-09-14 NOTE — Progress Notes (Signed)
Pt admitted voluntary and requests help with increased anxiety and worrying. Pt reports that he was treated for pneumonia recently and is suppose to be taking his antibiotic. Pt reports that he has a 34 year old daughter and was told recently that she has bronchitis. He said when he found out, he did not know what to do and called 911. Pt reports hearing voices daily "to do crazy things" like "break windows" etc. He reports that he is better since taking an abilify injection. Pt lives with his parents. He reports no si or hi at this time.

## 2014-09-14 NOTE — [facility] Suicide Risk Assessment (Addendum)
   Nursing information obtained from:  Patient Demographic factors:  Male, Jeffrey Coleman, lesbian, or bisexual orientation, Unemployed Current Mental Status:  Self-harm behaviors Loss Factors:  NA Historical Factors:  Impulsivity Risk Reduction Factors:  Responsible for children under 34 years of age, Living with another person, especially a relative Total Time spent with patient: 45 minutes  CLINICAL FACTORS:   Previous Psychiatric Diagnoses and Treatments  Psychiatric Specialty Exam: Physical Exam  Review of Systems  Constitutional: Negative.   HENT: Negative.   Eyes: Negative.   Respiratory: Negative.   Cardiovascular: Negative.   Gastrointestinal: Negative.   Genitourinary: Negative.   Musculoskeletal: Negative.   Skin: Negative.   Neurological: Negative.   Psychiatric/Behavioral: Positive for suicidal ideas (on admission).    Blood pressure 120/91, pulse 63, temperature 98.4 F (36.9 C), temperature source Oral, resp. rate 16, height 5' 3.5" (1.613 m), weight 58.854 kg (129 lb 12 oz).Body mass index is 22.62 kg/(m^2).  General Appearance: Casual  Eye Contact::  Fair  Speech:  Normal Rate  Volume:  Normal  Mood:  Euthymic  Affect:  Congruent  Thought Process:  Coherent  Orientation:  Full (Time, Place, and Person)  Thought Content:  WDL  Suicidal Thoughts:  No  Homicidal Thoughts:  No  Memory:  Immediate;   Fair Recent;   Fair Remote;   Fair  Judgement:  Fair  Insight:  Fair  Psychomotor Activity:  Normal  Concentration:  Fair  Recall:  [employer] of Knowledge:Fair  Language: Fair  Akathisia:  No  Handed:  Right  AIMS (if indicated):     Assets:  Communication Skills Desire for Improvement  Sleep:      Musculoskeletal: Strength & Muscle Tone: within normal limits Gait & Station: normal Patient leans: N/A  COGNITIVE FEATURES THAT CONTRIBUTE TO RISK:  Patient is alert ,oriented x3    SUICIDE RISK:   Mild:  Suicidal ideation of limited frequency, intensity,  duration, and specificity.  There are no identifiable plans, no associated intent, mild dysphoria and related symptoms, good self-control (both objective and subjective assessment), few other risk factors, and identifiable protective factors, including available and accessible social support.  PLAN OF CARE:.Restart home medications as scheduled. Patient on Abilify maintena IM ,received last dose 2 weeks ago. Will continue Abilify 5 MG PO PRN for severe anxiety/agitation. Spoke to Elsa at [phone] - per them patient is on Abilify Maintena 400 mg IM every 4 weeks ,last dose was on January 7 th ,2016. Patient also on Abilify 5 mg prn as well as cogentin prn for EPS.   I certify that inpatient services furnished can reasonably be expected to improve the patient's condition.  Thera Basden MD 09/14/2014, 1:23 PM

## 2014-09-14 NOTE — [facility] Group Notes (Signed)
[place] Group Notes:  (Counselor/Nursing/MHT/Case Management/Adjunct)  09/14/2014 1:15PM  Type of Therapy:  Group Therapy  Participation Level:  Active  Participation Quality:  Appropriate  Affect:  Flat  Cognitive:  Oriented  Insight:  Improving  Engagement in Group:  Limited  Engagement in Therapy:  Limited  Modes of Intervention:  Discussion, Exploration and Socialization  Summary of Progress/Problems: The topic for group was balance in life.  Pt participated in the discussion about when their life was in balance and out of balance and how this feels.  Pt discussed ways to get back in balance and short term goals they can work on to get where they want to be. Stated he was out of balance when he went to the ED "because my daughter had to go to the hospital, and I thought it was bad, and I couldn't cope.  But it was bronchitis.  I just found that out.  So I am feeling balanced again."  States that he has an ACT team, that he has been taking his meds as prescribed, and that he visits his daughter every weekend.   Jeffrey Coleman B 09/14/2014 11:42 AM

## 2014-09-14 NOTE — ED Notes (Signed)
TTS consult complete  

## 2014-09-14 NOTE — Progress Notes (Signed)
Patient ID: Jeffrey Coleman, male   DOB: 09/10/1980, 34 y.o.   MRN: [id number] PER STATE REGULATIONS 482.30  THIS CHART WAS REVIEWED FOR MEDICAL NECESSITY WITH RESPECT TO THE PATIENT'S ADMISSION/DURATION OF STAY.  NEXT REVIEW DATE: 09/18/14  Roma Schanz, RN, BSN CASE MANAGER

## 2014-09-14 NOTE — BH Assessment (Addendum)
Tele Assessment Note   Jeffrey Coleman is an 34 y.o. male.  -Clinician talked to Dr. Venora Coleman about need for TTS.  He was unfamiliar with patient at this time.  Jeffrey Jefferson, PA had seen patient.  Pt came in because of SI.  Hearing voices telling him to harm himself over the last day.  Patient came voluntarily to [place] because he has been having thoughts of killing himself.  He initially had no specific plan but said that he could use a knife from the kitchen.  Patient has had about three suicide attempts in the past.  Pt denies any HI or visual hallucinations.  He does have auditory hallucinations, hearing voices telling him to harm himself.    Patient says he has not been drinking nor using drugs.  Patient says that he gets a once per month Abilfy shot which is done through his ACTT team with [facility] in [place].  He sees them about twice weekly.  Patient recently was in the hospital for pneumonia and is taking a course of antibiotics for it.  -Clinician discussed patient care with Jeffrey Clan, PA.  He recommended contacting the ACTT team to get them involved and see if they may be able to follow up on patient care.    Clinician got permission to contact the ACTT team from patient.  There general number is [phone].  Their crisis line is [phone] and clinician called that number and spoke with Jeffrey Coleman.  She was familiar with patient.  She said that she would staff it with their psychiatrist but that it may be after 09:30 in the morning.  Pt told her that patient is willing to contract for safety.  Pt should not be discharged before he is seen by his ACTT team.  Patient knows that they will probably come over to [place].  Dr. Venora Coleman was informed that patient needed to stay in ED until [name] team visits him after they staff it with their psychiatrist.  They have been given the phone number to [facility]  Axis I: Schizoaffective Disorder Axis II: Deferred Axis III:  Past Medical  History  Diagnosis Date  . Schizo affective schizophrenia    Axis IV: economic problems, housing problems and other psychosocial or environmental problems Axis V: 31-40 impairment in reality testing  Past Medical History:  Past Medical History  Diagnosis Date  . Schizo affective schizophrenia     History reviewed. No pertinent past surgical history.  Family History:  Family History  Problem Relation Age of Onset  . Cancer Father     Social History:  reports that he has been smoking Cigarettes.  He has a 7.5 pack-year smoking history. He has never used smokeless tobacco. He reports that he does not drink alcohol or use illicit drugs.  Additional Social History:  Alcohol / Drug Use Pain Medications: None Prescriptions: Abilify IM once monthly at the [facility] in [place]: Has an antibiotic he is taking.  Last dose 01/11.  Thinks he started it two weeks ago to treat pneumonia History of alcohol / drug use?: No history of alcohol / drug abuse (Pt denies.)  CIWA: CIWA-Ar BP: 128/72 mmHg Pulse Rate: 78 COWS:    PATIENT STRENGTHS: (choose at least two) Communication skills Supportive family/friends  Allergies: No Known Allergies  Home Medications:  (Not in a hospital admission)  OB/GYN Status:  No LMP for male patient.  General Assessment Data Location of Assessment: [facility] Is  this a Tele or Face-to-Face Assessment?: Tele Assessment Is this an Initial Assessment or a Re-assessment for this encounter?: Initial Assessment Living Arrangements: Parent (Lives with mother.  Brother is there too.) Can pt return to current living arrangement?: Yes Admission Status: Voluntary Is patient capable of signing voluntary admission?: Yes Transfer from: [facility] Referral Source: Self/Family/Friend     [place] Living Arrangements: Parent (Lives with mother.  Brother is there too.) Name of Psychiatrist: Armen Coleman  ([name].) Name of  Therapist: Armen Coleman  (ACTT team through Jeffrey Coleman.)  Education Status Highest grade of school patient has completed: GED  Risk to self with the past 6 months Suicidal Ideation: Yes-Currently Present Suicidal Intent: Yes-Currently Present Is patient at risk for suicide?: Yes Suicidal Plan?: Yes-Currently Present Specify Current Suicidal Plan: Cut himself Access to Means: Yes Specify Access to Suicidal Means: Has knives at home. What has been your use of drugs/alcohol within the last 12 months?: Pt denies Previous Attempts/Gestures: Yes How many times?: 3 Other Self Harm Risks: None Triggers for Past Attempts: Unpredictable Intentional Self Injurious Behavior: None Family Suicide History: No Recent stressful life event(s):  (Pt cannot identify a specific stressor.) Persecutory voices/beliefs?: No Depression: Yes Depression Symptoms: Despondent, Loss of interest in usual pleasures, Feeling worthless/self pity, Fatigue, Isolating Substance abuse history and/or treatment for substance abuse?: No Suicide prevention information given to non-admitted patients: Not applicable  Risk to Others within the past 6 months Homicidal Ideation: No Thoughts of Harm to Others: No Current Homicidal Intent: No Current Homicidal Plan: No Access to Homicidal Means: No Identified Victim: No one History of harm to others?: No Assessment of Violence: None Noted Violent Behavior Description: Pt denies any violent behavior Does patient have access to weapons?: No Criminal Charges Pending?: No Does patient have a court date: No  Psychosis Hallucinations: Auditory, With command (Voices telling him to kill self) Delusions: None noted  Mental Status Report Appear/Hygiene: In scrubs Eye Contact: Poor Motor Activity: Freedom of movement, Unremarkable Speech: Logical/coherent Level of Consciousness: Alert, Quiet/awake Mood: Anxious, Depressed, Empty Affect: Depressed, Blunted, Anxious,  Sad Anxiety Level: Moderate Thought Processes: Coherent, Relevant Judgement: Unimpaired Orientation: Person, Place, Situation Obsessive Compulsive Thoughts/Behaviors: None  Cognitive Functioning Concentration: Decreased Memory: Recent Impaired, Remote Intact IQ: Average Insight: Fair Impulse Control: Fair Appetite: Good Weight Loss: 0 Weight Gain: 0 Sleep: Decreased Total Hours of Sleep: 4 Vegetative Symptoms: Staying in bed, Decreased grooming  ADLScreening [facility] Assessment Services) Patient's cognitive ability adequate to safely complete daily activities?: Yes Patient able to express need for assistance with ADLs?: Yes Independently performs ADLs?: Yes (appropriate for developmental age)  Prior Inpatient Therapy Prior Inpatient Therapy: Yes Prior Therapy Dates: 2012,2014 Prior Therapy Facilty/Provider(s): [facility], [place]  Reason for Treatment: Schizophrenia   Prior Outpatient Therapy Prior Outpatient Therapy: Yes Prior Therapy Dates: Current  Prior Therapy Facilty/Provider(s): [facility]  (ACTT services) Reason for Treatment: Med Mgt/ Therapy   ADL Screening (condition at time of admission) Patient's cognitive ability adequate to safely complete daily activities?: Yes Is the patient deaf or have difficulty hearing?: No Does the patient have difficulty seeing, even when wearing glasses/contacts?: No Does the patient have difficulty concentrating, remembering, or making decisions?: Yes Patient able to express need for assistance with ADLs?: Yes Does the patient have difficulty dressing or bathing?: No Independently performs ADLs?: Yes (appropriate for developmental age) Does the patient have difficulty walking or climbing stairs?: No Weakness of Legs: None Weakness of Arms/Hands: None  Advance Directives (For Healthcare) Does patient have an advance directive?: No Would patient like information on creating an advanced directive?: No - patient  declined information    Additional Information 1:1 In Past 12 Months?: No CIRT Risk: No Elopement Risk: No Does patient have medical clearance?: Yes     Disposition:  Disposition Initial Assessment Completed for this Encounter: Yes Disposition of Patient: Inpatient treatment program, Referred to Type of inpatient treatment program: Adult Patient referred to: Other (Comment) Frederico Hamman recommended  inpatient tx.)  Raymondo Band 09/14/2014 2:49 AM

## 2014-09-14 NOTE — ED Provider Notes (Signed)
Pt accepted to [facility], will transfer stable.   Francine Graven, DO 09/14/14 [phone]

## 2014-09-14 NOTE — H&P (Signed)
Psychiatric Admission Assessment Adult  Patient Identification:  Jeffrey Coleman  Date of Evaluation:  09/14/2014  Chief Complaint:  SCHIZOAFFECTIVE DISORDER   History of Present Illness: Jeffrey Coleman is a 34 year old African-American male, admitted from the [facility] ED. She reports, "The ambulance took me to the ED last night. I called 911. My 35 year old daughter was sent to the hospital 2 days ago. I got scared & panicked when I learned about it. I did not know what was going on. I got to feeling jittery. I felt like I was going to hurt myself, so I called 911. I did not try to hurt myself, but I have done so in the past x 3. In 2011, I did cut my wrist. I have Schizophrenia. I go to the [facility] in [place] for treatment. I receive mental health treatment from them. I'm on Abilify shots every month. I received the last injection a week ago. I feel better now. My daughter is doing better. I wished I had called & talked to her before I called the 911".  Elements:  Location:  Schizophrenia. Quality:  panic episodes, mood liability, , jittery, suicidal ideations. Severity:  Severe, "I did not have control of what was going on with me". Timing:  "Started 2 days ago". Duration:  "Lasted for a day". Context:  I learnt last night that my daughter was at the hospital, I got scared, panicked, became suicidal, called 911", .  Associated Signs/Symptoms:  Depression Symptoms:  Denies  (Hypo) Manic Symptoms:  Labiality of Mood,  Anxiety Symptoms:  Excessive Worry,  Psychotic Symptoms:  Hallucinations: Auditory Ambulance person voices)  PTSD Symptoms: NA  Total Time spent with patient: 1 hour  Psychiatric Specialty Exam: Physical Exam  Constitutional: He is oriented to person, place, and time. He appears well-developed.  HENT:  Head: Normocephalic.  Eyes: Pupils are equal, round, and reactive to light.  Neck: Normal range of motion.  Cardiovascular: Normal rate and regular  rhythm.   Elevated blood pressure  Respiratory: Effort normal and breath sounds normal.  GI: Soft. Bowel sounds are normal.  Genitourinary:  Denies any issues in this area  Musculoskeletal: Normal range of motion.  Neurological: He is alert and oriented to person, place, and time.  Skin: Skin is warm and dry.  Psychiatric: His speech is normal and behavior is normal. Judgment and thought content normal. His mood appears anxious. His affect is not angry, not blunt, not labile and not inappropriate. Cognition and memory are normal. He exhibits a depressed mood.    Review of Systems  Constitutional: Positive for malaise/fatigue.  HENT: Negative.   Eyes: Negative.   Respiratory: Negative.   Cardiovascular: Negative.   Gastrointestinal: Negative.   Genitourinary: Negative.   Musculoskeletal: Negative.   Skin: Negative.   Neurological: Positive for weakness.  Endo/Heme/Allergies: Negative.   Psychiatric/Behavioral: Positive for depression, suicidal ideas, hallucinations (Hx of) and substance abuse (Tobacco dependence). Negative for memory loss. The patient is nervous/anxious and has insomnia.     Blood pressure 120/91, pulse 63, temperature 98.4 F (36.9 C), temperature source Oral, resp. rate 16, height 5' 3.5" (1.613 m), weight 58.854 kg (129 lb 12 oz).Body mass index is 22.62 kg/(m^2).  General Appearance: Casual  Eye Contact::  Good  Speech:  Clear and Coherent  Volume:  Normal  Mood:  Calm  Affect:  Appropriate  Thought Process:  Coherent and Intact  Orientation:  Full (Time, Place, and Person)  Thought Content:  Admits  constant whispering voices in his head  Suicidal Thoughts:  No  Homicidal Thoughts:  No  Memory:  Immediate;   Good Recent;   Good Remote;   Good  Judgement:  Fair  Insight:  Fair  Psychomotor Activity:  Normal  Concentration:  Good  Recall:  Good  Fund of Knowledge:Fair  Language: Good  Akathisia:  No  Handed:  Right  AIMS (if indicated):     Assets:   Communication Skills Desire for Improvement  Sleep:      Musculoskeletal: Strength & Muscle Tone: within normal limits Gait & Station: normal Patient leans: N/A  Past Psychiatric History: Diagnosis: Schizophrenia,  Hospitalizations:  [facility] x 2, [facility] , 2 years ago  Outpatient Care: [facility] in [place], [place]  Substance Abuse Care:  Denies  Self-Mutilation: Yes, "Wrist cutting"  Suicidal Attempts: Yes, "3 times in the past"  Violent Behaviors: Denies   Past Medical History:   Past Medical History  Diagnosis Date  . Schizo affective schizophrenia    None.  Allergies:  No Known Allergies  PTA Medications: Prescriptions prior to admission  Medication Sig Dispense Refill Last Dose  . ARIPiprazole (ABILIFY MAINTENA) 400 MG SUSR Inject 400 mg into the muscle every 30 (thirty) days. Due to start 08/13/2014   Past Month at Unknown time  . clindamycin (CLEOCIN) 300 MG capsule Take 1 capsule (300 mg total) by mouth every 6 (six) hours. (Patient not taking: Reported on 09/14/2014) 120 capsule 0    Previous Psychotropic Medications: Medication/Dose  Abilify Maintena               Substance Abuse History in the last 12 months:  Yes.    Consequences of Substance Abuse: Medical Consequences:  Liver damage, Possible death by overdose Legal Consequences:  Arrests, jail time, Loss of driving privilege. Family Consequences:  Family discord, divorce and or separation.  Social History:  reports that he has been smoking Cigarettes.  He has a 7.5 pack-year smoking history. He has never used smokeless tobacco. He reports that he does not drink alcohol or use illicit drugs.  Additional Social History: Current Place of Residence: [place], [place]    Place of Birth: Scientist, research (medical)  Family Members: "My mother"  Marital Status:  Single  Children: 1  Sons: 0  Daughters: 1  Relationships: Single  Education:  GED  Educational Problems/Performance: Obtained GED  Religious  Beliefs/Practices: NA  History of Abuse (Emotional/Phsycial/Sexual): Denies  Occupational Experiences: Disabled  Military History:  None.  Legal History: Denies any pending legal charges  Hobbies/Interests: NA  Family History:   Family History  Problem Relation Age of Onset  . Cancer Father     Results for orders placed or performed during the hospital encounter of 09/13/14 (from the past 72 hour(s))  CBC with Differential     Status: Abnormal   Collection Time: 09/13/14 10:59 PM  Result Value Ref Range   WBC 6.6 4.0 - 10.5 K/uL   RBC 4.39 4.22 - 5.81 MIL/uL   Hemoglobin 13.7 13.0 - 17.0 g/dL   HCT 38.5 (L) 39.0 - 52.0 %   MCV 87.7 78.0 - 100.0 fL   MCH 31.2 26.0 - 34.0 pg   MCHC 35.6 30.0 - 36.0 g/dL   RDW 14.0 11.5 - 15.5 %   Platelets 275 150 - 400 K/uL   Neutrophils Relative % 44 43 - 77 %   Neutro Abs 2.9 1.7 - 7.7 K/uL   Lymphocytes Relative 44 12 -  46 %   Lymphs Abs 2.9 0.7 - 4.0 K/uL   Monocytes Relative 8 3 - 12 %   Monocytes Absolute 0.6 0.1 - 1.0 K/uL   Eosinophils Relative 3 0 - 5 %   Eosinophils Absolute 0.2 0.0 - 0.7 K/uL   Basophils Relative 1 0 - 1 %   Basophils Absolute 0.0 0.0 - 0.1 K/uL  Basic metabolic panel     Status: Abnormal   Collection Time: 09/13/14 10:59 PM  Result Value Ref Range   Sodium 134 (L) 135 - 145 mmol/L    Comment: Please note change in reference range.   Potassium 3.7 3.5 - 5.1 mmol/L    Comment: Please note change in reference range.   Chloride 100 96 - 112 mEq/L   CO2 26 19 - 32 mmol/L   Glucose, Bld 71 70 - 99 mg/dL   BUN 12 6 - 23 mg/dL   Creatinine, Ser 0.72 0.50 - 1.35 mg/dL   Calcium 8.7 8.4 - 18.2 mg/dL   GFR calc non Af Amer >90 >90 mL/min   GFR calc Af Amer >90 >90 mL/min    Comment: (NOTE) The eGFR has been calculated using the CKD EPI equation. This calculation has not been validated in all clinical situations. eGFR's persistently <90 mL/min signify possible Chronic Kidney Disease.    Anion gap 8 5 - 15   Ethanol     Status: None   Collection Time: 09/13/14 10:59 PM  Result Value Ref Range   Alcohol, Ethyl (B) <5 0 - 9 mg/dL    Comment:        LOWEST DETECTABLE LIMIT FOR SERUM ALCOHOL IS 11 mg/dL FOR MEDICAL PURPOSES ONLY   Urinalysis, Routine w reflex microscopic     Status: None   Collection Time: 09/14/14  3:10 AM  Result Value Ref Range   Color, Urine YELLOW YELLOW   APPearance CLEAR CLEAR   Specific Gravity, Urine 1.010 1.005 - 1.030   pH 6.5 5.0 - 8.0   Glucose, UA NEGATIVE NEGATIVE mg/dL   Hgb urine dipstick NEGATIVE NEGATIVE   Bilirubin Urine NEGATIVE NEGATIVE   Ketones, ur NEGATIVE NEGATIVE mg/dL   Protein, ur NEGATIVE NEGATIVE mg/dL   Urobilinogen, UA 0.2 0.0 - 1.0 mg/dL   Nitrite NEGATIVE NEGATIVE   Leukocytes, UA NEGATIVE NEGATIVE    Comment: MICROSCOPIC NOT DONE ON URINES WITH NEGATIVE PROTEIN, BLOOD, LEUKOCYTES, NITRITE, OR GLUCOSE <1000 mg/dL.  Drug screen panel, emergency     Status: None   Collection Time: 09/14/14  3:10 AM  Result Value Ref Range   Opiates NONE DETECTED NONE DETECTED   Cocaine NONE DETECTED NONE DETECTED   Benzodiazepines NONE DETECTED NONE DETECTED   Amphetamines NONE DETECTED NONE DETECTED   Tetrahydrocannabinol NONE DETECTED NONE DETECTED   Barbiturates NONE DETECTED NONE DETECTED    Comment:        DRUG SCREEN FOR MEDICAL PURPOSES ONLY.  IF CONFIRMATION IS NEEDED FOR ANY PURPOSE, NOTIFY LAB WITHIN 5 DAYS.        LOWEST DETECTABLE LIMITS FOR URINE DRUG SCREEN Drug Class       Cutoff (ng/mL) Amphetamine      1000 Barbiturate      200 Benzodiazepine   200 Tricyclics       300 Opiates          300 Cocaine          300 THC              50  Psychological Evaluations:  Assessment:   DSM5: Schizophrenia Disorders:  NA Obsessive-Compulsive Disorders:  NA Trauma-Stressor Disorders:  NA Substance/Addictive Disorders:  NA Depressive Disorders:  Schizoaffective disorder  AXIS I:  Schizoaffective disorder AXIS II:   Deferred AXIS III:   Past Medical History  Diagnosis Date  . Schizo affective schizophrenia    AXIS IV:  other psychosocial or environmental problems and mental illness, chronic AXIS V:  11-20 some danger of hurting self or others possible OR occasionally fails to maintain minimal personal hygiene OR gross impairment in communication  Treatment Plan/Recommendations: 1. Admit for crisis management and stabilization, estimated length of stay 3-5 days.  2. Medication management to reduce current symptoms to base line and improve the patient's overall level of functioning, reports indicated patient on Injectable Abilify Maintena Q monthly and was administered a week ago. Would like to remain on this medicine. 3. Treat health problems as indicated.  4. Develop treatment plan to decrease risk of relapse upon discharge and the need for readmission.  5. Psycho-social education regarding relapse prevention and self care.  6. Health care follow up as needed for medical problems.  7. Review, reconcile, and reinstate any pertinent home medications for other health issues where appropriate. 8. Call for consults with hospitalist for any additional specialty patient care services as needed.  Treatment Plan Summary: Daily contact with patient to assess and evaluate symptoms and progress in treatment Medication management  Current Medications:  No current facility-administered medications for this encounter.   Observation Level/Precautions:  15 minute checks  Laboratory:  Per ED  Psychotherapy:  Group sessions  Medications: See medication lists    Consultations: As needed  Discharge Concerns:  Safety, mood stabilization  Estimated LOS: 5-7 days  Other:     I certify that inpatient services furnished can reasonably be expected to improve the patient's condition.   Encarnacion Slates, PMHNP, FNP-BC 1/12/201610:59 AM

## 2014-09-14 NOTE — Progress Notes (Signed)
CSW spoke with [facility] team to inform them that Pt was being transferred to [facility] inpatient.  Staff agreeable to the treatment plan.   Peri Maris, [place] 09/14/2014 9:41 AM

## 2014-09-14 NOTE — [facility] Group Notes (Signed)
Adult Psychoeducational Group Note  Date:  09/14/2014 Time:  9:31 PM  Group Topic/Focus:  Wrap-Up Group:   The focus of this group is to help patients review their daily goal of treatment and discuss progress on daily workbooks.  Participation Level:  Minimal  Participation Quality:  Appropriate  Affect:  Flat  Cognitive:  Alert and Appropriate  Insight: Limited  Engagement in Group:  Limited  Modes of Intervention:  Discussion  Additional Comments:  Jeffrey Coleman is a new admit.  He introduced himself to the group.  He expressed that his day was pretty good.  He also said he is a leo and has a 50 year old daughter.  Victorino Sparrow A 09/14/2014, 9:31 PM

## 2014-09-14 NOTE — [facility] Counselor (Signed)
Adult Comprehensive Assessment  Patient ID: Jeffrey Coleman, male   DOB: 10/20/1980, 34 y.o.   MRN: [id number]  Information Source:    Current Stressors:     Living/Environment/Situation:  Living Arrangements: Parent  Family History:     Childhood History:     Education:     Employment/Work Situation:      Pensions consultant:      Alcohol/Substance Abuse:      Social Support System:   Astronomer System: mother  Leisure/Recreation:      Strengths/Needs:      Discharge Plan:      Summary/Recommendations:   Summary and Recommendations (to be completed by the Architect): Jeffrey Coleman is a 34 YO AA male who was last admitted a month ago, and previous to that 2.5 years ago for same symptoms.  Today he states that he was worried about his daughter who had to be evaluated at the hospital.  He found out she had bronchitis, which he found out is not as bad as he thought it was.  "They have medication for it, and she will be OK."  Pt has an ACT team, who was willing to come to ED to assess the patient, but he was sent here before they had a chance to visit.  Pt states he has been compliant with medication.  "I just get real worried about muy daughter.  She's the only thing I care about."  Pt can benefit from crises stabilization, therapetic milieu, medication managment  and  coordination with ACT team.  Jeffrey Coleman B. 09/14/2014

## 2014-09-14 NOTE — ED Notes (Signed)
Called [facility] for transport to [facility].  Driver will be here in about 45 min.

## 2014-09-14 NOTE — Progress Notes (Signed)
D:Patient in his room in bed on approach.  Patient states he had a good day.  Patient states the only reason he is here is because he was worrying.  Patient states he know now that he should have thought his situation through before calling EMS.  Patient denies SI/HI but states he does have auditory hallucinations.  Patient did not elaborate on what the voices were saying.  A: Staff to monitor Q 15 mins for safety.  Encouragement and support offered.  No scheduled medications administered per orders. R: Patient remains safe on the unit.  Patient attended group tonight.  Patient minimally visible on the unit.  No administered medications given patient was not available.

## 2014-09-15 ENCOUNTER — Encounter (HOSPITAL_COMMUNITY): Payer: Self-pay | Admitting: Registered Nurse

## 2014-09-15 LAB — TSH: TSH: 0.4 u[IU]/mL (ref 0.350–4.500)

## 2014-09-15 LAB — HEMOGLOBIN A1C
Hgb A1c MFr Bld: 5.3 % (ref <5.7)
Mean Plasma Glucose: 105 mg/dL (ref <117)

## 2014-09-15 LAB — LIPID PANEL
Cholesterol: 171 mg/dL (ref 0–200)
HDL: 40 mg/dL (ref >39)
LDL Cholesterol: 115 mg/dL — ABNORMAL HIGH (ref 0–99)
TRIGLYCERIDES: 82 mg/dL (ref <150)
Total CHOL/HDL Ratio: 4.3 RATIO
VLDL: 16 mg/dL (ref 0–40)

## 2014-09-15 MED ORDER — CLINDAMYCIN HCL 300 MG PO CAPS: 300.0000 mg | ORAL_CAPSULE | Freq: Four times a day (QID) | ORAL | Status: AC

## 2014-09-15 MED ORDER — ARIPIPRAZOLE 15 MG PO TBDP: 15.0000 mg | ORAL_TABLET | Freq: Every day | ORAL | Status: DC | PRN

## 2014-09-15 MED ORDER — ARIPIPRAZOLE ER 400 MG IM SUSR: 400.0000 mg | INTRAMUSCULAR | Status: DC

## 2014-09-15 MED ORDER — HYDROXYZINE HCL 25 MG PO TABS: 25.0000 mg | ORAL_TABLET | Freq: Three times a day (TID) | ORAL | Status: DC | PRN

## 2014-09-15 MED ORDER — ARIPIPRAZOLE 5 MG PO TABS: 5.0000 mg | ORAL_TABLET | Freq: Every day | ORAL | Status: DC | PRN

## 2014-09-15 MED ORDER — BENZTROPINE MESYLATE 0.5 MG PO TABS: 0.5000 mg | ORAL_TABLET | Freq: Every day | ORAL | Status: DC | PRN

## 2014-09-15 MED ORDER — TRAZODONE HCL 50 MG PO TABS: 50.0000 mg | ORAL_TABLET | Freq: Every evening | ORAL | Status: DC | PRN
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
## 2014-09-15 NOTE — Progress Notes (Signed)
Patient ID: Jeffrey Coleman, male   DOB: Jun 28, 1981, 34 y.o.   MRN: [id number]  Pt. Denies SI/HI and A/V hallucinations to this Probation officer. Belongings returned to patient at time of discharge. Patient denies any pain or discomfort. Discharge instructions and medications were reviewed with patient. Patient verbalized understanding of both medications and discharge instructions. Patient left via cab voucher. No distress noted upon discharge. Q15 minute safety checks maintained until discharge.

## 2014-09-15 NOTE — Progress Notes (Signed)
Nutrition Brief Note  Patient identified on the Malnutrition Screening Tool (MST) Report  Pt reports good appetite now and PTA. Pt states he may have lost weight d/t a recent admission for PNA. States he was in the hospital for 8 days. He is eating much better now. D/C is today.  Wt Readings from Last 15 Encounters:  09/14/14 129 lb 12 oz (58.854 kg)  08/16/14 128 lb 9.2 oz (58.32 kg)  07/28/14 132 lb (59.875 kg)  07/28/14 140 lb (63.504 kg)    Body mass index is 22.62 kg/(m^2). Patient meets criteria for normal range based on current BMI.   Diet Order: Diet regular Pt is also offered choice of unit snacks mid-morning and mid-afternoon.  Pt is eating as desired.   Labs and medications reviewed.   No nutrition interventions warranted at this time. If nutrition issues arise, please consult RD.   Clayton Bibles, MS, RD, LDN Pager: [phone] After Hours Pager: [phone]
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
## 2014-09-15 NOTE — BHH Suicide Risk Assessment (Signed)
[place] INPATIENT:  Family/Significant Other Suicide Prevention Education  Suicide Prevention Education:  Education Completed; Tico Crotteau, mother, [phone]  has been identified by the patient as the family member/significant other with whom the patient will be residing, and identified as the person(s) who will aid the patient in the event of a mental health crisis (suicidal ideations/suicide attempt).  With written consent from the patient, the family member/significant other has been provided the following suicide prevention education, prior to the and/or following the discharge of the patient.  The suicide prevention education provided includes the following:  Suicide risk factors  Suicide prevention and interventions  National Suicide Hotline telephone number  [facility] assessment telephone number  [facility] Emergency Assistance [place] and/or Residential Mobile Crisis Unit telephone number  Request made of family/significant other to:  Remove weapons (e.g., guns, rifles, knives), all items previously/currently identified as safety concern.    Remove drugs/medications (over-the-counter, prescriptions, illicit drugs), all items previously/currently identified as a safety concern.  The family member/significant other verbalizes understanding of the suicide prevention education information provided.  The family member/significant other agrees to remove the items of safety concern listed above.  [name] 09/15/2014, 11:17 AM

## 2014-09-15 NOTE — Progress Notes (Signed)
[facility] Adult Case Management Discharge Plan :  Will you be returning to the same living situation after discharge: Yes,  home At discharge, do you have transportation home?:Yes,  Will cab him as neither mother nor ACT team is able to pick him up from [place] you have the ability to pay for your medications:Yes,  MCD  Release of information consent forms completed and in the chart;  Patient's signature needed at discharge.  Patient to Follow up at: Follow-up Information    Follow up with [facility].   Why:  Call them when you get home to see when they will be seeing you again   Contact information:   [place] [phone]      Patient denies SI/HI:   Yes,  yes    Safety Planning and Suicide Prevention discussed:  Yes,  yes  Patient refused referral  Trish Mage 09/15/2014, 1:23 PM

## 2014-09-15 NOTE — Tx Team (Signed)
  Interdisciplinary Treatment Plan Update   Date Reviewed:  09/15/2014  Time Reviewed:  11:20 AM  Progress in Treatment:   Attending groups: Yes Participating in groups: Yes Taking medication as prescribed: Yes  Tolerating medication: Yes Family/Significant other contact made: Yes  Patient understands diagnosis: Yes  Discussing patient identified problems/goals with staff: Yes  See initial care plan Medical problems stabilized or resolved: Yes Denies suicidal/homicidal ideation: Yes  In tx team Patient has not harmed self or others: Yes  For review of initial/current patient goals, please see plan of care.  Estimated Length of Stay:  D/C today  Reason for Continuation of Hospitalization:   New Problems/Goals identified:  N/A  Discharge Plan or Barriers:   return home, follow up outpt with ACT team  Additional Comments:  Pt should not have been admitted.  Was waiting at [facility] to be seen by ACT team to determine ability to be sent directly home, and was sent here in the meantime.  Pt was assymptomatic when he arrived. Stated he decompensated when his daughter had to go to the ED with breathing issues.  When he found out she would be given meds for bronchitis and be OK, he was OK.    Attendees:  Signature: Steva Colder, MD 09/15/2014 11:20 AM   Signature: Ripley Fraise, LCSW 09/15/2014 11:20 AM  Signature:  09/15/2014 11:20 AM  Signature:  09/15/2014 11:20 AM  Signature: Darrol Angel, RN 09/15/2014 11:20 AM  Signature:  09/15/2014 11:20 AM  Signature:   09/15/2014 11:20 AM  Signature:    Signature:    Signature:    Signature:    Signature:    Signature:      Scribe for Treatment Team:   [employer], LCSW  09/15/2014 11:20 AM

## 2014-09-15 NOTE — BHH Suicide Risk Assessment (Signed)
   Demographic Factors:  Male  Total Time spent with patient: 45 minutes  Psychiatric Specialty Exam: Physical Exam  ROS  Blood pressure 124/86, pulse 84, temperature 98 F (36.7 C), temperature source Oral, resp. rate 20, height 5' 3.5" (1.613 m), weight 58.854 kg (129 lb 12 oz).Body mass index is 22.62 kg/(m^2).  General Appearance: Casual  Eye Contact::  Fair  Speech:  Clear and Coherent  Volume:  Normal  Mood:  Euthymic  Affect:  Appropriate  Thought Process:  Coherent  Orientation:  Full (Time, Place, and Person)  Thought Content:  WDL  Suicidal Thoughts:  No  Homicidal Thoughts:  No  Memory:  Immediate;   Fair Recent;   Fair Remote;   Fair  Judgement:  Fair  Insight:  Shallow  Psychomotor Activity:  Normal  Concentration:  Fair  Recall:  [place]: Fair  Akathisia:  No  Handed:  Right  AIMS (if indicated):     Assets:  Communication Skills Desire for Improvement Physical Health Social Support  Sleep:  Number of Hours: 6.75    Musculoskeletal: Strength & Muscle Tone: within normal limits Gait & Station: normal Patient leans: N/A   Mental Status Per Nursing Assessment::   On Admission:  Self-harm behaviors  Current Mental Status by Physician: Patient denies SI/HI/AH/VH. Reports that he panicked when his daughter was taken to the hospital. Patient reports daughter as doing well.   Loss Factors: NA  Historical Factors: Impulsivity  Risk Reduction Factors:   Positive social support and Positive therapeutic relationship  Continued Clinical Symptoms:  Previous Psychiatric Diagnoses and Treatments  Cognitive Features That Contribute To Risk:  Polarized thinking    Suicide Risk:  Minimal: No identifiable suicidal ideation.   Discharge Diagnoses:   Primary Psychiatric Diagnosis: Adjustment disorder with mixed anxiety and depressed mood (resolved)   Secondary Psychiatric Diagnosis: Schizophrenia ,chronic    Non  Psychiatric Diagnosis: See PMH  Past Medical History  Diagnosis Date  . Schizo affective schizophrenia     Plan Of Care/Follow-up recommendations: Patient to continue Abilify Maintena 400 mg IM ,next dose on 10/07/14. Activity:  no activity restrictions Diet:  regular  Is patient on multiple antipsychotic therapies at discharge:  No   Has Patient had three or more failed trials of antipsychotic monotherapy by history:  No  Recommended Plan for Multiple Antipsychotic Therapies: NA    Yates Weisgerber MD 09/15/2014, 9:24 AM

## 2014-09-17 NOTE — Progress Notes (Signed)
Patient Discharge Instructions:  After Visit Summary (AVS):   Faxed to:  09/17/14 Discharge Summary Note:   Faxed to:  09/17/14 Psychiatric Admission Assessment Note:   Faxed to:  09/17/14 Suicide Risk Assessment - Discharge Assessment:   Faxed to:  09/17/14 Faxed/Sent to the Next Level Care provider:  09/17/14 Faxed to [facility] [place], 09/17/2014, 3:41 PM

## 2014-10-01 LAB — AFB CULTURE WITH SMEAR (NOT AT ARMC): Acid Fast Smear: NONE SEEN

## 2014-10-02 LAB — AFB CULTURE WITH SMEAR (NOT AT ARMC): Acid Fast Smear: NONE SEEN

## 2014-10-03 LAB — AFB CULTURE WITH SMEAR (NOT AT ARMC): ACID FAST SMEAR: NONE SEEN

## 2014-10-08 ENCOUNTER — Encounter (HOSPITAL_COMMUNITY): Payer: Self-pay

## 2014-10-08 ENCOUNTER — Emergency Department (HOSPITAL_COMMUNITY): Payer: Medicare Other

## 2014-10-08 ENCOUNTER — Emergency Department (HOSPITAL_COMMUNITY)
Admission: EM | Admit: 2014-10-08 | Discharge: 2014-10-08 | Disposition: A | Discharge status: Home / Self Care | Payer: Medicare Other | Attending: Emergency Medicine | Admitting: Emergency Medicine

## 2014-10-08 DIAGNOSIS — R0789 Other chest pain: Secondary | ICD-10-CM

## 2014-10-08 DIAGNOSIS — R002 Palpitations: Secondary | ICD-10-CM | POA: Insufficient documentation

## 2014-10-08 DIAGNOSIS — Z79899 Other long term (current) drug therapy: Secondary | ICD-10-CM | POA: Insufficient documentation

## 2014-10-08 DIAGNOSIS — F250 Schizoaffective disorder, bipolar type: Secondary | ICD-10-CM | POA: Diagnosis not present

## 2014-10-08 DIAGNOSIS — R079 Chest pain, unspecified: Secondary | ICD-10-CM | POA: Insufficient documentation

## 2014-10-08 DIAGNOSIS — Z872 Personal history of diseases of the skin and subcutaneous tissue: Secondary | ICD-10-CM | POA: Diagnosis not present

## 2014-10-08 LAB — COMPREHENSIVE METABOLIC PANEL
ALT: 32 U/L (ref 0–53)
ANION GAP: 2 — AB (ref 5–15)
AST: 25 U/L (ref 0–37)
Albumin: 3.9 g/dL (ref 3.5–5.2)
Alkaline Phosphatase: 57 U/L (ref 39–117)
BUN: 5 mg/dL — ABNORMAL LOW (ref 6–23)
CALCIUM: 8.6 mg/dL (ref 8.4–10.5)
CHLORIDE: 109 mmol/L (ref 96–112)
CO2: 26 mmol/L (ref 19–32)
CREATININE: 0.82 mg/dL (ref 0.50–1.35)
GFR calc Af Amer: >90 mL/min (ref >90)
Glucose, Bld: 94 mg/dL (ref 70–99)
Potassium: 4 mmol/L (ref 3.5–5.1)
Sodium: 137 mmol/L (ref 135–145)
TOTAL PROTEIN: 7.3 g/dL (ref 6.0–8.3)
Total Bilirubin: 0.4 mg/dL (ref 0.3–1.2)

## 2014-10-08 LAB — I-STAT TROPONIN, ED: TROPONIN I, POC: 0 ng/mL (ref 0.00–0.08)

## 2014-10-08 LAB — CBC WITH DIFFERENTIAL/PLATELET
Basophils Absolute: 0.1 10*3/uL (ref 0.0–0.1)
Basophils Relative: 1 % (ref 0–1)
EOS PCT: 3 % (ref 0–5)
Eosinophils Absolute: 0.3 10*3/uL (ref 0.0–0.7)
HEMATOCRIT: 42.1 % (ref 39.0–52.0)
Hemoglobin: 15.1 g/dL (ref 13.0–17.0)
Lymphocytes Relative: 58 % — ABNORMAL HIGH (ref 12–46)
Lymphs Abs: 4.4 10*3/uL — ABNORMAL HIGH (ref 0.7–4.0)
MCH: 31.5 pg (ref 26.0–34.0)
MCHC: 35.9 g/dL (ref 30.0–36.0)
MCV: 87.7 fL (ref 78.0–100.0)
MONO ABS: 0.5 10*3/uL (ref 0.1–1.0)
Monocytes Relative: 7 % (ref 3–12)
Neutro Abs: 2.3 10*3/uL (ref 1.7–7.7)
Neutrophils Relative %: 31 % — ABNORMAL LOW (ref 43–77)
Platelets: 258 10*3/uL (ref 150–400)
RBC: 4.8 MIL/uL (ref 4.22–5.81)
RDW: 14.9 % (ref 11.5–15.5)
WBC: 7.5 10*3/uL (ref 4.0–10.5)

## 2014-10-08 LAB — TROPONIN I: Troponin I: <0.03 ng/mL (ref <0.031)

## 2014-10-08 NOTE — ED Notes (Signed)
Left side chest pain

## 2014-10-08 NOTE — Discharge Instructions (Signed)
STOP USING COCAINE. You can be evaluated for your chest pain and fluttering of your heart by Dr Harl Bowie, cardiologist, or one of his partners, call his office to get an appointment. Recheck if you get the chest pain or fluttering in your chest again.  Palpitations A palpitation is the feeling that your heartbeat is irregular. It may feel like your heart is fluttering or skipping a beat. It may also feel like your heart is beating faster than normal. This is usually not a serious problem. In some cases, you may need more medical tests. HOME CARE  Avoid:  Caffeine in coffee, tea, soft drinks, diet pills, and energy drinks.  Chocolate.  Alcohol.  Stop smoking if you smoke.  Reduce your stress and anxiety. Try:  A method that measures bodily functions so you can learn to control them (biofeedback).  Yoga.  Meditation.  Physical activity such as swimming, jogging, or walking.  Get plenty of rest and sleep. GET HELP IF:  Your fast or irregular heartbeat continues after 24 hours.  Your palpitations occur more often. GET HELP RIGHT AWAY IF:   You have chest pain.  You feel short of breath.  You have a very bad headache.  You feel dizzy or pass out (faint). MAKE SURE YOU:   Understand these instructions.  Will watch your condition.  Will get help right away if you are not doing well or get worse. Document Released: 05/29/2008 Document Revised: 01/04/2014 Document Reviewed: 10/19/2011 [facility] Patient Information 2015 [place], [place]. This information is not intended to replace advice given to you by your health care provider. Make sure you discuss any questions you have with your health care provider.

## 2014-10-08 NOTE — ED Provider Notes (Signed)
CSN: [id number]     Arrival date & time 10/08/14  0219 History   First MD Initiated Contact with Patient 10/08/14 0300     Chief Complaint  Patient presents with  . Chest Pain     (Consider location/radiation/quality/duration/timing/severity/associated sxs/prior Treatment) HPI  Patient states about 11 PM this evening he was watching TV with his grandmother and he had acute onset of a pressure sensation in his chest. He states he felt like his heart was fluttering. He had some shortness of breath. He states his left leg felt numb. He did not get sweaty, nausea, but did feel lightheaded. He states he felt like he needed to have diarrhea however he did not. He denies any pain or swelling of his extremities. He states he's never had this before. He states he has not had any further episodes. Patient reports his last crack use was on February 3. He states he did not do any tonight.  Family history his grandmother has had a MI in the past she is in her 80s.  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 He reports he is on Abilify and he is doing well. He states he's not hearing forces and he states that that is in controlled well.    PCP [facility]  Past Medical History  Diagnosis Date  . Schizo affective schizophrenia    History reviewed. No pertinent past surgical history. Family History  Problem Relation Age of Onset  . Cancer Father    History  Substance Use Topics  . Smoking status: Current Every Day Smoker -- 0.50 packs/day for 15 years    Types: Cigarettes  . Smokeless tobacco: Never Used  . Alcohol Use: No  lives with GM Last smoked crack 2/3  Review of Systems  All other systems reviewed and are negative.     Allergies  Review of patient's allergies indicates no known allergies.  Home Medications   Prior to Admission medications   Medication Sig Start Date End Date Taking? Authorizing Provider  ARIPiprazole (ABILIFY) 5 MG  tablet Take 1 tablet (5 mg total) by mouth daily as needed (FOR SEVERE ANXIETY/AGITATION). 09/15/14   Shuvon Rankin, NP  ARIPiprazole 400 MG SUSR Inject 400 mg into the muscle every 28 (twenty-eight) days. Next dose is due 10/07/2014 10/07/14   Shuvon Rankin, NP  benztropine (COGENTIN) 0.5 MG tablet Take 1 tablet (0.5 mg total) by mouth daily as needed for tremors. 09/15/14   Shuvon Rankin, NP  hydrOXYzine (ATARAX/VISTARIL) 25 MG tablet Take 1 tablet (25 mg total) by mouth 3 (three) times daily as needed for anxiety. 09/15/14   Shuvon Rankin, NP  traZODone (DESYREL) 50 MG tablet Take 1 tablet (50 mg total) by mouth at bedtime as needed for sleep. 09/15/14   Shuvon Rankin, NP   BP 120/89 mmHg  Pulse 74  Temp(Src) 98.2 F (36.8 C) (Oral)  Resp 20  Ht 5\' 4"  (1.626 m)  Wt 140 lb (63.504 kg)  BMI 24.02 kg/m2  SpO2 99%  Vital signs normal   Physical Exam  Constitutional: He is oriented to person, place, and time. He appears well-developed and well-nourished.  Non-toxic appearance. He does not appear ill. No distress.  HENT:  Head: Normocephalic and atraumatic.  Right Ear: External ear normal.  Left Ear: External ear normal.  Nose: Nose normal. No mucosal edema or rhinorrhea.  Mouth/Throat: Oropharynx is clear and moist and mucous membranes are normal. No dental abscesses or uvula swelling.  Eyes:  Conjunctivae and EOM are normal. Pupils are equal, round, and reactive to light.  Neck: Normal range of motion and full passive range of motion without pain. Neck supple.  Cardiovascular: Normal rate, regular rhythm and normal heart sounds.  Exam reveals no gallop and no friction rub.   No murmur heard. Pulmonary/Chest: Effort normal and breath sounds normal. No respiratory distress. He has no wheezes. He has no rhonchi. He has no rales. He exhibits no tenderness and no crepitus.  Abdominal: Soft. Normal appearance and bowel sounds are normal. He exhibits no distension. There is no tenderness. There is no  rebound and no guarding.  Musculoskeletal: Normal range of motion. He exhibits no edema or tenderness.  Moves all extremities well.   Neurological: He is alert and oriented to person, place, and time. He has normal strength. No cranial nerve deficit.  Skin: Skin is warm, dry and intact. No rash noted. No erythema. No pallor.  Psychiatric: He has a normal mood and affect. His speech is normal and behavior is normal. His mood appears not anxious.  Nursing note and vitals reviewed.   ED Course  Procedures (including critical care time)   Patient has not had any further symptoms while in the ED. He has been on a cardiac monitor and has remained normal sinus rhythm.   Labs Review Results for orders placed or performed during the hospital encounter of 10/08/14  CBC with Differential  Result Value Ref Range   WBC 7.5 4.0 - 10.5 K/uL   RBC 4.80 4.22 - 5.81 MIL/uL   Hemoglobin 15.1 13.0 - 17.0 g/dL   HCT 42.1 39.0 - 52.0 %   MCV 87.7 78.0 - 100.0 fL   MCH 31.5 26.0 - 34.0 pg   MCHC 35.9 30.0 - 36.0 g/dL   RDW 14.9 11.5 - 15.5 %   Platelets 258 150 - 400 K/uL   Neutrophils Relative % 31 (L) 43 - 77 %   Neutro Abs 2.3 1.7 - 7.7 K/uL   Lymphocytes Relative 58 (H) 12 - 46 %   Lymphs Abs 4.4 (H) 0.7 - 4.0 K/uL   Monocytes Relative 7 3 - 12 %   Monocytes Absolute 0.5 0.1 - 1.0 K/uL   Eosinophils Relative 3 0 - 5 %   Eosinophils Absolute 0.3 0.0 - 0.7 K/uL   Basophils Relative 1 0 - 1 %   Basophils Absolute 0.1 0.0 - 0.1 K/uL  Comprehensive metabolic panel  Result Value Ref Range   Sodium 137 135 - 145 mmol/L   Potassium 4.0 3.5 - 5.1 mmol/L   Chloride 109 96 - 112 mmol/L   CO2 26 19 - 32 mmol/L   Glucose, Bld 94 70 - 99 mg/dL   BUN 5 (L) 6 - 23 mg/dL   Creatinine, Ser 0.82 0.50 - 1.35 mg/dL   Calcium 8.6 8.4 - 10.5 mg/dL   Total Protein 7.3 6.0 - 8.3 g/dL   Albumin 3.9 3.5 - 5.2 g/dL   AST 25 0 - 37 U/L   ALT 32 0 - 53 U/L   Alkaline Phosphatase 57 39 - 117 U/L   Total  Bilirubin 0.4 0.3 - 1.2 mg/dL   GFR calc non Af Amer >90 >90 mL/min   GFR calc Af Amer >90 >90 mL/min   Anion gap 2 (L) 5 - 15  Troponin I  Result Value Ref Range   Troponin I <0.03 <0.031 ng/mL  I-stat troponin, ED  Result Value Ref Range   Troponin i,  poc 0.00 0.00 - 0.08 ng/mL   Comment 3            Laboratory interpretation all normal    Imaging Review Dg Chest 2 View  10/08/2014   CLINICAL DATA:  Chest pressure.  EXAM: CHEST  2 VIEW  COMPARISON:  Radiograph 09/13/2014.  Chest CT 08/1914  FINDINGS: Lower lung volumes from prior exam. Persistent but improving linear opacity in the right suprahilar lung at site of prior cavitary lesion. Increased ill-defined opacity at the right lung base likely atelectasis related to low lung volumes. The heart size is normal. There is no pleural effusion or pneumothorax. No acute osseous abnormalities.  IMPRESSION: Hypoventilatory chest. Ill-defined opacities at the right lung base may reflect hypoventilatory atelectasis. Persistent linear opacities in the right suprahilar lung, likely related scarring.   Electronically Signed   By: Jeb Levering M.D.   On: 10/08/2014 03:28     EKG Interpretation   Date/Time:  Friday October 08 2014 02:34:12 EST Ventricular Rate:  71 PR Interval:  139 QRS Duration: 79 QT Interval:  391 QTC Calculation: 425 R Axis:   67 Text Interpretation:  Sinus rhythm ST elev, probable normal early repol  pattern Since last tracing rate slower  15 Aug 2014 Confirmed by Javante Nilsson   MD-I, Ondria Oswald ([id number]) on 10/08/2014 3:04:52 AM      MDM   Final diagnoses:  Chest pain at rest  Palpitations    Plan discharge  Rolland Porter, MD, Alanson Aly, MD 10/08/14 [phone]

## 2014-11-13 ENCOUNTER — Emergency Department (HOSPITAL_COMMUNITY)
Admission: EM | Admit: 2014-11-13 | Discharge: 2014-11-13 | Disposition: A | Discharge status: Home / Self Care | Payer: Medicare Other | Attending: Emergency Medicine | Admitting: Emergency Medicine

## 2014-11-13 ENCOUNTER — Emergency Department (HOSPITAL_COMMUNITY): Payer: Medicare Other

## 2014-11-13 ENCOUNTER — Encounter (HOSPITAL_COMMUNITY): Payer: Self-pay | Admitting: Emergency Medicine

## 2014-11-13 DIAGNOSIS — R091 Pleurisy: Secondary | ICD-10-CM | POA: Diagnosis not present

## 2014-11-13 DIAGNOSIS — Z792 Long term (current) use of antibiotics: Secondary | ICD-10-CM | POA: Insufficient documentation

## 2014-11-13 DIAGNOSIS — Z720 Tobacco use: Secondary | ICD-10-CM | POA: Diagnosis not present

## 2014-11-13 DIAGNOSIS — J159 Unspecified bacterial pneumonia: Secondary | ICD-10-CM | POA: Insufficient documentation

## 2014-11-13 DIAGNOSIS — R059 Cough, unspecified: Secondary | ICD-10-CM

## 2014-11-13 DIAGNOSIS — Z79899 Other long term (current) drug therapy: Secondary | ICD-10-CM | POA: Insufficient documentation

## 2014-11-13 DIAGNOSIS — F250 Schizoaffective disorder, bipolar type: Secondary | ICD-10-CM | POA: Diagnosis not present

## 2014-11-13 DIAGNOSIS — J189 Pneumonia, unspecified organism: Secondary | ICD-10-CM

## 2014-11-13 DIAGNOSIS — R05 Cough: Secondary | ICD-10-CM

## 2014-11-13 DIAGNOSIS — R079 Chest pain, unspecified: Secondary | ICD-10-CM | POA: Diagnosis present

## 2014-11-13 MED ORDER — ACETAMINOPHEN 325 MG PO TABS: 650.0000 mg | ORAL_TABLET | Freq: Once | ORAL | Status: DC

## 2014-11-13 MED ORDER — ACETAMINOPHEN 500 MG PO TABS
ORAL_TABLET | ORAL | Status: AC
  Filled 2014-11-13: qty 1

## 2014-11-13 MED ORDER — ACETAMINOPHEN 500 MG PO TABS
1000.0000 mg | ORAL_TABLET | Freq: Once | ORAL | Status: AC
  Administered 2014-11-13: 1000 mg via ORAL

## 2014-11-13 MED ORDER — DOXYCYCLINE HYCLATE 100 MG PO CAPS: 100.0000 mg | ORAL_CAPSULE | Freq: Two times a day (BID) | ORAL | Status: DC

## 2014-11-13 NOTE — ED Provider Notes (Addendum)
CSN: [id number]     Arrival date & time 11/13/14  1929 History  This chart was scribed for Jeffrey Morrison, MD by Edison Simon, [name]. This patient was seen in room APA11/APA11 and the patient's care was started at 8:15 PM.    Chief Complaint  Patient presents with  . Chest Pain   Patient is a 34 y.o. male presenting with shortness of breath. The history is provided by the patient. No language interpreter was used.  Shortness of Breath Severity:  Moderate Onset quality:  Sudden Duration:  5 hours Timing:  Constant Progression:  Worsening Chronicity:  Recurrent Relieved by:  None tried Worsened by:  Nothing tried Ineffective treatments:  None tried Associated symptoms: chest pain and cough   Associated symptoms: no abdominal pain, no fever and no vomiting     HPI Comments: Jeffrey Coleman is a 34 y.o. male who presents to the Emergency Department complaining of SOB with onset 5 hours ago. He states he had similar symptoms when he had pneumonia. He reports associated cough. He states he has chest pain with breathing and coughing. He states his mother is sick. He denies history of heart or lung problems. He denies prior blood clots. He denies recent surgery or long travel. He denies history of cancer. He denies fever, leg swelling, weight change, abdominal pain, vomiting, diarrhea, or hemoptysis.  Past Medical History  Diagnosis Date  . Schizo affective schizophrenia    History reviewed. No pertinent past surgical history. Family History  Problem Relation Age of Onset  . Cancer Father    History  Substance Use Topics  . Smoking status: Current Every Day Smoker -- 0.50 packs/day for 15 years    Types: Cigarettes  . Smokeless tobacco: Never Used  . Alcohol Use: Yes     Comment: occasional    Review of Systems  Constitutional: Negative for fever and unexpected weight change.  Respiratory: Positive for cough and shortness of breath.   Cardiovascular: Positive for chest pain.  Negative for leg swelling.  Gastrointestinal: Negative for vomiting, abdominal pain and diarrhea.  All other systems reviewed and are negative.     Allergies  Review of patient's allergies indicates no known allergies.  Home Medications   Prior to Admission medications   Medication Sig Start Date End Date Taking? Authorizing Provider  ARIPiprazole 400 MG SUSR Inject 400 mg into the muscle every 28 (twenty-eight) days. Next dose is due 10/07/2014 10/07/14  Yes Shuvon B Rankin, NP  benztropine (COGENTIN) 1 MG tablet Take 1 mg by mouth daily as needed for tremors.   Yes Historical Provider, MD  levofloxacin (LEVAQUIN) 750 MG tablet Take 750 mg by mouth daily.   Yes Historical Provider, MD  ARIPiprazole (ABILIFY) 5 MG tablet Take 1 tablet (5 mg total) by mouth daily as needed (FOR SEVERE ANXIETY/AGITATION). Patient not taking: Reported on 11/13/2014 09/15/14   Shuvon B Rankin, NP  benztropine (COGENTIN) 0.5 MG tablet Take 1 tablet (0.5 mg total) by mouth daily as needed for tremors. Patient not taking: Reported on 11/13/2014 09/15/14   Shuvon B Rankin, NP  doxycycline (VIBRAMYCIN) 100 MG capsule Take 1 capsule (100 mg total) by mouth 2 (two) times daily. One po bid x 7 days 11/13/14   Jeffrey Morrison, MD  hydrOXYzine (ATARAX/VISTARIL) 25 MG tablet Take 1 tablet (25 mg total) by mouth 3 (three) times daily as needed for anxiety. Patient not taking: Reported on 11/13/2014 09/15/14   Mercy Moore Rankin, NP  traZODone (DESYREL)  50 MG tablet Take 1 tablet (50 mg total) by mouth at bedtime as needed for sleep. Patient not taking: Reported on 11/13/2014 09/15/14   Shuvon B Rankin, NP   BP 119/74 mmHg  Pulse 95  Temp(Src) 98.5 F (36.9 C) (Oral)  Resp 20  Ht 5\' 4"  (1.626 m)  Wt 140 lb (63.504 kg)  BMI 24.02 kg/m2  SpO2 97% Physical Exam  Constitutional: He is oriented to person, place, and time. He appears well-developed and well-nourished.  HENT:  Head: Normocephalic and atraumatic.  Eyes: Conjunctivae  are normal.  Neck: Normal range of motion. Neck supple.  Pulmonary/Chest: Effort normal.  Musculoskeletal: Normal range of motion.  Neurological: He is alert and oriented to person, place, and time.  Skin: Skin is warm and dry.  Psychiatric: He has a normal mood and affect.  Nursing note and vitals reviewed.   ED Course  Procedures (including critical care time)  DIAGNOSTIC STUDIES: Oxygen Saturation is 97% on room air, normal by my interpretation.    COORDINATION OF CARE: 8:18 PM Discussed treatment plan with patient at beside, the patient agrees with the plan and has no further questions at this time.   Labs Review Labs Reviewed - No data to display  Imaging Review Dg Chest 2 View  11/13/2014   CLINICAL DATA:  Chest pain for 1 day.  EXAM: CHEST  2 VIEW  COMPARISON:  Radiographs 10/08/2014, chest CT 08/21/2014  FINDINGS: The cardiomediastinal contours are normal. Unchanged linear scarring in the right upper lobe. Small focal linear opacity in the right middle lobe, new. Pulmonary vasculature is normal. No pleural effusion or pneumothorax. No acute osseous abnormalities are seen.  IMPRESSION: New focal linear opacity in the right middle lobe, may reflect atelectasis or pneumonia. There is unchanged scarring in the right upper lobe.   Electronically Signed   By: Jeb Levering M.D.   On: 11/13/2014 20:19     EKG Interpretation None      MDM   Final diagnoses:  Pleurisy  Cough  CAP (community acquired pneumonia)   I personally performed the services described in this documentation, which was scribed in my presence. The recorded information has been reviewed and is accurate.  Patient presents with cough and pleuritic discomfort with coughing. Clinical concern for pneumonia versus bronchitis persists pleurisy, patient has had this in the past. Chest x-ray ordered, Tylenol for pain. Chest x-ray reviewed by myself no acute infiltrate  Radiology read x-rays possible  infiltrate. Community acquired antibiotics given. Results and differential diagnosis were discussed with the patient/parent/guardian. Close follow up outpatient was discussed, comfortable with the plan.   Medications  acetaminophen (TYLENOL) 500 MG tablet (not administered)  acetaminophen (TYLENOL) tablet 1,000 mg (1,000 mg Oral Given 11/13/14 2113)    Filed Vitals:   11/13/14 1934  BP: 119/74  Pulse: 95  Temp: 98.5 F (36.9 C)  TempSrc: Oral  Resp: 20  Height: 5\' 4"  (1.626 m)  Weight: 140 lb (63.504 kg)  SpO2: 97%    Final diagnoses:  Pleurisy  Cough  CAP (community acquired pneumonia)      Jeffrey Morrison, MD 11/13/14 2117  Jeffrey Morrison, MD 11/13/14 2152  Jeffrey Morrison, MD 11/13/14 2152

## 2014-11-13 NOTE — Discharge Instructions (Signed)
If you were given medicines take as directed.  If you are on coumadin or contraceptives realize their levels and effectiveness is altered by many different medicines.  If you have any reaction (rash, tongues swelling, other) to the medicines stop taking and see a physician.   Tylenol and motrin for pain.  Please follow up as directed and return to the ER or see a physician for new or worsening symptoms.  Thank you. Filed Vitals:   11/13/14 1934  BP: 119/74  Pulse: 95  Temp: 98.5 F (36.9 C)  TempSrc: Oral  Resp: 20  Height: 5\' 4"  (1.626 m)  Weight: 140 lb (63.504 kg)  SpO2: 97%

## 2014-11-13 NOTE — ED Notes (Signed)
Per EMS: pt has been taking antibiotics for pneumonia for the past 8 days. C/o pain with breathes and cough.

## 2014-12-16 ENCOUNTER — Emergency Department (HOSPITAL_COMMUNITY)
Admission: EM | Admit: 2014-12-16 | Discharge: 2014-12-16 | Disposition: A | Discharge status: Home / Self Care | Payer: Medicare Other | Attending: Emergency Medicine | Admitting: Emergency Medicine

## 2014-12-16 ENCOUNTER — Encounter (HOSPITAL_COMMUNITY): Payer: Self-pay | Admitting: Emergency Medicine

## 2014-12-16 DIAGNOSIS — Z720 Tobacco use: Secondary | ICD-10-CM | POA: Diagnosis not present

## 2014-12-16 DIAGNOSIS — Z792 Long term (current) use of antibiotics: Secondary | ICD-10-CM | POA: Diagnosis not present

## 2014-12-16 DIAGNOSIS — Z008 Encounter for other general examination: Secondary | ICD-10-CM | POA: Diagnosis present

## 2014-12-16 DIAGNOSIS — Z79899 Other long term (current) drug therapy: Secondary | ICD-10-CM | POA: Insufficient documentation

## 2014-12-16 DIAGNOSIS — R45851 Suicidal ideations: Secondary | ICD-10-CM | POA: Insufficient documentation

## 2014-12-16 LAB — COMPREHENSIVE METABOLIC PANEL
ALT: 21 U/L (ref 0–53)
ANION GAP: 9 (ref 5–15)
AST: 22 U/L (ref 0–37)
Albumin: 3.8 g/dL (ref 3.5–5.2)
Alkaline Phosphatase: 46 U/L (ref 39–117)
BUN: 8 mg/dL (ref 6–23)
CALCIUM: 8.9 mg/dL (ref 8.4–10.5)
CO2: 24 mmol/L (ref 19–32)
CREATININE: 0.96 mg/dL (ref 0.50–1.35)
Chloride: 108 mmol/L (ref 96–112)
GFR calc Af Amer: >90 mL/min (ref >90)
GFR calc non Af Amer: >90 mL/min (ref >90)
Glucose, Bld: 101 mg/dL — ABNORMAL HIGH (ref 70–99)
Potassium: 3.4 mmol/L — ABNORMAL LOW (ref 3.5–5.1)
Sodium: 141 mmol/L (ref 135–145)
TOTAL PROTEIN: 6.9 g/dL (ref 6.0–8.3)
Total Bilirubin: 0.6 mg/dL (ref 0.3–1.2)

## 2014-12-16 LAB — SALICYLATE LEVEL

## 2014-12-16 LAB — ETHANOL: Alcohol, Ethyl (B): <5 mg/dL (ref 0–9)

## 2014-12-16 LAB — RAPID URINE DRUG SCREEN, HOSP PERFORMED
Amphetamines: NOT DETECTED
Barbiturates: NOT DETECTED
Benzodiazepines: NOT DETECTED
COCAINE: POSITIVE — AB
OPIATES: NOT DETECTED
Tetrahydrocannabinol: NOT DETECTED

## 2014-12-16 LAB — CBC
HEMATOCRIT: 46.6 % (ref 39.0–52.0)
HEMOGLOBIN: 17.1 g/dL — AB (ref 13.0–17.0)
MCH: 32 pg (ref 26.0–34.0)
MCHC: 36.7 g/dL — ABNORMAL HIGH (ref 30.0–36.0)
MCV: 87.1 fL (ref 78.0–100.0)
Platelets: 219 10*3/uL (ref 150–400)
RBC: 5.35 MIL/uL (ref 4.22–5.81)
RDW: 14.8 % (ref 11.5–15.5)
WBC: 13.5 10*3/uL — ABNORMAL HIGH (ref 4.0–10.5)

## 2014-12-16 LAB — ACETAMINOPHEN LEVEL: Acetaminophen (Tylenol), Serum: <10 ug/mL — ABNORMAL LOW (ref 10–30)

## 2014-12-16 MED ORDER — PROMETHAZINE HCL 12.5 MG PO TABS: 25.0000 mg | ORAL_TABLET | Freq: Four times a day (QID) | ORAL | Status: DC | PRN

## 2014-12-16 NOTE — ED Notes (Signed)
Patient awaiting tele-assessment.

## 2014-12-16 NOTE — BHH Counselor (Signed)
Almendarez,Mary Mother [phone]   Writer left voicemail for pt's mother. Writer also called [phone] but there voicemail was full. TTS is trying to get in touch w/ pt's family so they'll pick him up upon d/c.  Arnold Long, [place] Therapeutic Triage Specialist

## 2014-12-16 NOTE — BHH Counselor (Addendum)
Writer spoke again w/ Catalina Pizza NP. Per [facility], Dr Dwyane Dee recommends pt be d/c with follow up with ACTT as long as pt contracts for safety. Writer updated Oliver. TC from [place] who left voicemail for pt's aunt to pick pt up. Gabriel Cirri reports she called pt's grandmother but there was no voicemail set up.  Arnold Long, [place] Therapeutic Triage Specialist [phone]   Writer left voicemail for pt's [place] [phone]. Call back from [place] at [facility]. She reports that they are short staffed and wouldn't be able to pick up pt from [place]. She also says that they will be short staffed tomorrow. Writer then spoke w/ Catalina Pizza NP who states that pt can only go home if accompanied by pt's ACTT. [facility] NP recommends pt stay in [facility] to have psych eval am of 4/15. Writer called Gabriel Cirri back and told her [facility] would keep pt overnight. Gabriel Cirri sts the one ACTT member of staff tomorrow is named Risk manager.  Arnold Long, [place] Therapeutic Triage Specialist [phone]

## 2014-12-16 NOTE — Progress Notes (Signed)
This case was discussed with me via Leron Cro[facility] TTS). The pt is contracting for safety and reports that he will call EMS if he feels suicidal, which he has done previously. Pt reports to [facility] staff that he would not actually harm himself and that he wanted to be evaluated. Per Chrys Racer, she was able to contact the ACT Team and they are aware of his situation and willing to assist him with followup regarding psychiatric care and counseling. However, they are unable to physically pick him up from the Emergency Department today or tomorrow due to staffing. This NP reviewed the chart and history including labs, notes,  and prior admissions to [facility] and then called Dr. Dwyane Dee to consider discharge vs. Holding pt for further evaluation.  Per Dr. Dwyane Dee, pt can be discharged home with followup arranged with his ACT Team with [facility] TTS staff. Please have pt sign no-harm contract.   Benjamine Mola, FNP-BC 12/16/2014      10:51 AM

## 2014-12-16 NOTE — ED Notes (Signed)
Patient lying in bed with eyes closed; equal rise and fall of chest noted.  Sitter remains with patient.

## 2014-12-16 NOTE — ED Provider Notes (Signed)
Pt will contract for safety and pysc rec d/c home with follow up with act team  Milton Ferguson, MD 12/16/14 1047

## 2014-12-16 NOTE — ED Notes (Signed)
Pt signed no harm contract. Faxed to [facility]

## 2014-12-16 NOTE — ED Notes (Signed)
Per EMS pt. Reports worsening depression and has suicidal ideation.

## 2014-12-16 NOTE — ED Notes (Signed)
Pt states he called his mother and states she is home. Pt previously stated, "Doors are never locked anyway because none of [place]  Have a key."

## 2014-12-16 NOTE — ED Notes (Signed)
Meal Given by sitter.

## 2014-12-16 NOTE — Progress Notes (Signed)
In order to facilitate pt's transportation home upon d/c, CSW called numbers listed for pt's mother and grandmother (listed on facesheet) and was unable to reach either.  Called ACTT at [phone] and spoke with Darrell. He states he will go to pt's residence and see if family is home. Will return call to this Probation officer.  Sharren Bridge, MSW, [place] Clinical Social Work, Disposition  12/16/2014 [phone]

## 2014-12-16 NOTE — Progress Notes (Signed)
Attempted contact with Darrell with pt's ACTT to follow up re: contacting pt's family to inform of d/c and to inquire as to whether family is at home. Unable to reach.   Spoke with [facility] RN who states pt reports that his family is at their home. Pt aware his family could not be contacted by this Probation officer or pt's assessor and did not offer any additional contact numbers.   CSW called pt's ACTT and informed that pt is being d/c. Representative states she will inform pt's team member Darrell of pt's status. Pt to follow up with ACTT upon arrival home.  Sharren Bridge, MSW, [place] Clinical Social Work, Disposition  12/16/2014 [phone]

## 2014-12-16 NOTE — Discharge Instructions (Signed)
Follow up with act team.  Return if needed

## 2014-12-16 NOTE — BH Assessment (Addendum)
Tele Assessment Note   Jeffrey Coleman is an 34 y.o. male. Writer spoke w/ EDP Nanavati re: pt's presentation prior to TA. Per chart review, pt had been admitted to [facility] x 3 for schizoaffective d/o (2016, 2015, 2012). Pt was BIB by EMS voluntarily. Pt is cooperative during assessment. He is oriented to person, situation, and place. Pt is oriented to month but thinks it is 2013. Pt endorses SI. He sts he became suicidal three days ago. Pt sts there were no stressors which occurred three days ago. Pt reports baseline AH with command. He says he does his best to ignore the "voices". Pt sts the voices tell him things like "break the mirror" and "turn the refrigerator over". He reports prior suicide attempt by hanging when he was 34 yo. Pt denies HI and no delusions noted. Pt reports he smoked a marijuana blunt laced with crack cocaine on 12/13/14. Pt sts he went to a 66 day rehab program at age 27 yo for crack. He reports that prior to 12/13/14, the last time he used crack was Oct 2016. Pt reports his ACTT is with [facility]. He reports he receives Abilify injection monthly, and the injections make the "voices less strong".  Writer ran pt by Catalina Pizza NP. [place] recommends that pt be d/c to ACTT if [name] [phone] is willing to take him home from ED and pt is willing to contract for safety. Per Heloise Purpura, pt will need to be reevaluated 4/15 am by psych if ACTT not willing to pick him up.   Axis I: Schizoaffective Disorder, Depressive Type Axis II: Deferred Axis III:  Past Medical History  Diagnosis Date  . Schizo affective schizophrenia    Axis IV: other psychosocial or environmental problems and problems related to social environment Axis V: 41-50 serious symptoms  Past Medical History:  Past Medical History  Diagnosis Date  . Schizo affective schizophrenia     History reviewed. No pertinent past surgical history.  Family History:  Family History  Problem Relation Age of  Onset  . Cancer Father     Social History:  reports that he has been smoking Cigarettes.  He has a 7.5 pack-year smoking history. He has never used smokeless tobacco. He reports that he drinks alcohol. He reports that he uses illicit drugs (Marijuana).  Additional Social History:  Alcohol / Drug Use Pain Medications: pt denies abuse - see PTA meds list Prescriptions: pt denies abuse - see PTA meds list Over the Counter: pt denies abuse - see PTA meds list History of alcohol / drug use?: Yes Substance #1 Name of Substance 1: crack cocaine 1 - Age of First Use: as a teenager 1 - Amount (size/oz): varied 1 - Duration: pt sts hasn't used since Oct 2015 1 - Last Use / Amount: pt sts smoked blunt laced w/ crack on 12/13/14 - pt sts rarely smokes THC   CIWA: CIWA-Ar BP: 125/85 mmHg Pulse Rate: 62 COWS:    PATIENT STRENGTHS: (choose at least two) Ability for insight Average or above average intelligence Capable of independent living Communication skills  Allergies: No Known Allergies  Home Medications:  (Not in a hospital admission)  OB/GYN Status:  No LMP for male patient.  General Assessment Data Location of Assessment: [facility] Is this a Tele or Face-to-Face Assessment?: Tele Assessment Is this an Initial Assessment or a Re-assessment for this encounter?: Initial Assessment Living Arrangements: Parent, Other relatives (mom & brother) Can pt return to current living  arrangement?: Yes Admission Status: Voluntary Is patient capable of signing voluntary admission?: Yes Transfer from: Home Referral Source: Self/Family/Friend (pt called EMS)     [facility] Living Arrangements: Parent, Other relatives (mom & brother) Name of Psychiatrist: at [facility] Name of Therapist: at [facility]  Education Status Is patient currently in school?: No Highest grade of school patient has completed: 11 (pt didn't complete 12 grade, pt received GED )  Risk to self with the past  6 months Suicidal Ideation: Yes-Currently Present Is patient at risk for suicide?: Yes Access to Means: Yes Specify Access to Suicidal Means: has knives and sharps at home What has been your use of drugs/alcohol within the last 12 months?: pt sts hasn't used crack cocaine since last Oct Previous Attempts/Gestures: Yes How many times?: 1 (by hanging when he was 34 yo) Other Self Harm Risks: none Triggers for Past Attempts: Hallucinations Intentional Self Injurious Behavior: None Family Suicide History: No Recent stressful life event(s): Recent negative physical changes, Other (Comment) (depressive sxs over past 3 days) Persecutory voices/beliefs?: No Depression: Yes Depression Symptoms: Loss of interest in usual pleasures, Guilt, Isolating, Tearfulness, Insomnia Substance abuse history and/or treatment for substance abuse?: Yes  Risk to Others within the past 6 months Homicidal Ideation: No Thoughts of Harm to Others: No Current Homicidal Intent: No Current Homicidal Plan: No Access to Homicidal Means: No Identified Victim: none History of harm to others?: No Assessment of Violence: None Noted Violent Behavior Description: pt denies hx violence - is calm during assessment Does patient have access to weapons?: No Criminal Charges Pending?: No Does patient have a court date: No  Psychosis Hallucinations: Auditory, With command (pt reports AH with command are typical for him) Delusions: None noted  Mental Status Report Appearance/Hygiene: Unremarkable, In scrubs Eye Contact: Good Motor Activity: Freedom of movement Speech: Logical/coherent Level of Consciousness: Alert, Quiet/awake Mood: Depressed, Guilty, Sad Affect: Appropriate to circumstance, Sad, Depressed Anxiety Level: None Thought Processes: Coherent, Relevant Judgement: Unimpaired Orientation: Person, Place, Time, Situation (pt thought it was 2013 but knew month ) Obsessive Compulsive Thoughts/Behaviors:  None  Cognitive Functioning Concentration: Normal Memory: Recent Intact, Remote Intact IQ: Average Insight: Good Impulse Control: Good Appetite: Poor Sleep: Decreased Total Hours of Sleep: 4 Vegetative Symptoms: None  ADLScreening [facility] Assessment Services) Patient's cognitive ability adequate to safely complete daily activities?: Yes Patient able to express need for assistance with ADLs?: Yes Independently performs ADLs?: Yes (appropriate for developmental age)  Prior Inpatient Therapy Prior Inpatient Therapy: Yes Prior Therapy Dates: 2016, 2015, 2014, 2012, 2001 Prior Therapy Facilty/Provider(s): [facility], [place], unknown 45 day SA prog when 34 yo Reason for Treatment: schizoaffective d/o, cocaine abuse  Prior Outpatient Therapy Prior Outpatient Therapy: Yes Prior Therapy Dates: currently Prior Therapy Facilty/Provider(s): [facility] - ACTT Reason for Treatment: med management, therapy  ADL Screening (condition at time of admission) Patient's cognitive ability adequate to safely complete daily activities?: Yes Is the patient deaf or have difficulty hearing?: No Does the patient have difficulty seeing, even when wearing glasses/contacts?: No Does the patient have difficulty concentrating, remembering, or making decisions?: No Patient able to express need for assistance with ADLs?: Yes Does the patient have difficulty dressing or bathing?: No Independently performs ADLs?: Yes (appropriate for developmental age) Does the patient have difficulty walking or climbing stairs?: No Weakness of Legs: None Weakness of Arms/Hands: None  Home Assistive Devices/Equipment Home Assistive Devices/Equipment: None    Abuse/Neglect Assessment (Assessment to be complete while patient is  alone) Physical Abuse: Denies Verbal Abuse: Denies Sexual Abuse: Denies Exploitation of patient/patient's resources: Denies Self-Neglect: Denies     Regulatory affairs officer (For Healthcare) Does  patient have an advance directive?: No    Additional Information 1:1 In Past 12 Months?: No CIRT Risk: No Elopement Risk: No Does patient have medical clearance?: Yes     Disposition:   Writer ran pt by Catalina Pizza NP. [place] recommends that pt be d/c to ACTT if [facility] ACTT is willing to take him home from ED and pt is willing to contract for safety. Per Heloise Purpura, pt will need to be reevaluated 4/15 am by psych if ACTT not willing to pick him up.   Disposition Initial Assessment Completed for this Encounter: Yes Disposition of Patient: Other dispositions (per Catalina Pizza NP, pt can go home w/ ACTT if they agree,)  [place] No P 12/16/2014 8:21 AM

## 2014-12-16 NOTE — ED Notes (Signed)
[facility] to take pt home. Charge RN going to complete voucher at this time.

## 2014-12-16 NOTE — ED Notes (Signed)
Phoned [facility] to determine status of pts ride home. Was told that Jeffrey Coleman is at lunch and she would return our phone call to let us know. If [facility] feels that it is ok to send pt home by cab then a voucher will be provided by the ED.

## 2014-12-16 NOTE — ED Provider Notes (Signed)
CSN: [id number]     Arrival date & time 12/16/14  0054 History   First MD Initiated Contact with Patient 12/16/14 [phone]     Chief Complaint  Patient presents with  . V70.1     (Consider location/radiation/quality/duration/timing/severity/associated sxs/prior Treatment) HPI Comments: PT comes in with cc of depression. PT reports that he has schizophrenia, but lately he has been feeling depressed for unknown reason and having suicidal thoughts. He has baseline auditory hallucinations. Admits to crack use yday. Pt has no active plan, has tried to hang himself in the past.  The history is provided by the patient.    Past Medical History  Diagnosis Date  . Schizo affective schizophrenia    History reviewed. No pertinent past surgical history. Family History  Problem Relation Age of Onset  . Cancer Father    History  Substance Use Topics  . Smoking status: Current Every Day Smoker -- 0.50 packs/day for 15 years    Types: Cigarettes  . Smokeless tobacco: Never Used  . Alcohol Use: Yes     Comment: occasional    Review of Systems  Constitutional: Positive for activity change. Negative for fever.  Respiratory: Negative for chest tightness and shortness of breath.   Cardiovascular: Negative for chest pain.  Gastrointestinal: Negative for nausea.  Psychiatric/Behavioral: Positive for suicidal ideas.      Allergies  Review of patient's allergies indicates no known allergies.  Home Medications   Prior to Admission medications   Medication Sig Start Date End Date Taking? Authorizing Provider  ARIPiprazole (ABILIFY) 5 MG tablet Take 1 tablet (5 mg total) by mouth daily as needed (FOR SEVERE ANXIETY/AGITATION). Patient not taking: Reported on 11/13/2014 09/15/14   Shuvon B Rankin, NP  ARIPiprazole 400 MG SUSR Inject 400 mg into the muscle every 28 (twenty-eight) days. Next dose is due 10/07/2014 10/07/14   Shuvon B Rankin, NP  benztropine (COGENTIN) 0.5 MG tablet Take 1 tablet (0.5 mg  total) by mouth daily as needed for tremors. Patient not taking: Reported on 11/13/2014 09/15/14   Shuvon B Rankin, NP  benztropine (COGENTIN) 1 MG tablet Take 1 mg by mouth daily as needed for tremors.    Historical Provider, MD  doxycycline (VIBRAMYCIN) 100 MG capsule Take 1 capsule (100 mg total) by mouth 2 (two) times daily. One po bid x 7 days 11/13/14   Elnora Morrison, MD  hydrOXYzine (ATARAX/VISTARIL) 25 MG tablet Take 1 tablet (25 mg total) by mouth 3 (three) times daily as needed for anxiety. Patient not taking: Reported on 11/13/2014 09/15/14   Shuvon B Rankin, NP  levofloxacin (LEVAQUIN) 750 MG tablet Take 750 mg by mouth daily.    Historical Provider, MD  traZODone (DESYREL) 50 MG tablet Take 1 tablet (50 mg total) by mouth at bedtime as needed for sleep. Patient not taking: Reported on 11/13/2014 09/15/14   Shuvon B Rankin, NP   BP 130/78 mmHg  Pulse 70  Temp(Src) 98.1 F (36.7 C) (Oral)  Resp 16  Ht 5\' 4"  (1.626 m)  Wt 130 lb (58.968 kg)  BMI 22.30 kg/m2  SpO2 100% Physical Exam  Constitutional: He is oriented to person, place, and time. He appears well-developed.  Neck: Neck supple.  Cardiovascular: Normal rate.   Pulmonary/Chest: Effort normal. No respiratory distress.  Abdominal: Soft. There is no tenderness.  Neurological: He is alert and oriented to person, place, and time.  Psychiatric: His behavior is normal. Thought content normal.  Depressed affect  Nursing note and vitals reviewed.  ED Course  Procedures (including critical care time) Labs Review Labs Reviewed  ACETAMINOPHEN LEVEL - Abnormal; Notable for the following:    Acetaminophen (Tylenol), Serum <10.0 (*)    All other components within normal limits  CBC - Abnormal; Notable for the following:    WBC 13.5 (*)    Hemoglobin 17.1 (*)    MCHC 36.7 (*)    All other components within normal limits  COMPREHENSIVE METABOLIC PANEL - Abnormal; Notable for the following:    Potassium 3.4 (*)    Glucose, Bld  101 (*)    All other components within normal limits  URINE RAPID DRUG SCREEN (HOSP PERFORMED) - Abnormal; Notable for the following:    Cocaine POSITIVE (*)    All other components within normal limits  ETHANOL  SALICYLATE LEVEL    Imaging Review No results found.   EKG Interpretation None      MDM   Final diagnoses:  Suicidal ideations   Pt with SI. No serious plans currently. Has hx of schizophrenia.  Will get Psych to evaluate.    Varney Biles, MD 12/16/14 [phone]

## 2014-12-16 NOTE — ED Notes (Signed)
Patient resting in bed eyes closed; equal rise and fall of chest noted.  Sitter remains with patient.

## 2014-12-21 NOTE — H&P (Signed)
PATIENT NAME:  Jeffrey Coleman, Jeffrey Coleman MR#:  [id number] DATE OF BIRTH:  [birth year]  DATE OF ADMISSION:  04/10/2012  REFERRING PHYSICIAN: Lenise Arena, MD   ATTENDING PHYSICIAN: Mariaguadalupe Fialkowski B. Bary Leriche, MD    IDENTIFYING DATA: Jeffrey Coleman is a 34 year old male with history of schizophrenia.   CHIEF COMPLAINT: "I am suicidal."   HISTORY OF PRESENT ILLNESS: Jeffrey Coleman has been treated for schizophrenia for many years. He is in care of [facility]. He has been maintained on injections on [place] and Cogentin. He has been compliant with his medications. He reports that for the past three days he has been feeling down with poor sleep, decreased appetite, crying, social isolation, and some irritability. He started thinking about killing himself in response to auditory command hallucinations. He has several plans to use a knife, jump in front of a car, and decided to come to the hospital asking for help. He believes that spending a few days in the hospital and going to groups will be beneficial. He also has been sleeping poorly and hopes that we can help that. He denies symptoms suggestive of bipolar mania. He denies alcohol, or illicit substances, or prescription pill abuse.   PAST PSYCHIATRIC HISTORY: There were several psychiatric hospitalizations prior to 2011. At that time, the patient was on Haldol injections and Depakote. He was also using marijuana, cocaine and alcohol. He reports that he has been fairly stable in the past few years working with ACT team. He reports a history of cutting.   FAMILY PSYCHIATRIC HISTORY: He has a brother with bipolar, a grandfather who is an alcoholic, and an uncle with schizophrenia.   PAST MEDICAL HISTORY: None.   ALLERGIES: No known drug allergies.     MEDICATIONS ON ADMISSION:  1. Invega Sustenna 156 mg every 4 weeks. 2. Cogentin 1 mg twice daily.   SOCIAL HISTORY: He lives in [place] with his mother and a brother. 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 He has a child that he sees frequently.   REVIEW OF SYSTEMS: CONSTITUTIONAL: No fevers or chills. No weight changes. EYES: No double or blurred vision. ENT: No hearing loss. RESPIRATORY: No shortness of breath or cough. CARDIOVASCULAR: No chest pain or orthopnea. GASTROINTESTINAL: No abdominal pain, nausea, vomiting, or diarrhea. GU: No incontinence or frequency. ENDOCRINE: No heat or cold intolerance. LYMPHATIC: No anemia or easy bruising. INTEGUMENTARY: No acne or rash. MUSCULOSKELETAL: No muscle or joint pain. NEUROLOGIC: No tingling or weakness. PSYCHIATRIC: See history of present illness for details.   PHYSICAL EXAMINATION:  VITAL SIGNS: Blood pressure 105/69, pulse 66, respirations 18, temperature 96.2.   GENERAL: This is a well developed young male in no acute distress.   HEENT: The pupils are equal, round, and reactive to light. Sclerae anicteric.   NECK: Supple. No thyromegaly.   LUNGS: Clear to auscultation. No dullness to percussion.   HEART: Regular rhythm and rate. No murmurs, rubs, or gallops.   ABDOMEN: Soft, nontender, nondistended. Positive bowel sounds.   MUSCULOSKELETAL: Normal muscle strength in all extremities.   SKIN: No rashes or bruises.   LYMPHATIC: No cervical adenopathy.   NEUROLOGIC: Cranial nerves II through XII are intact.  LABORATORY, DIAGNOSTIC AND RADIOLOGICAL DATA: Chemistries are within normal limits. Urine tox screen is negative for substances. Blood alcohol level not done. CBC: White blood count 9.6, RBC 6.07, hemoglobin 19.2, hematocrit 54.6, platelets 230. Urinalysis is not suggestive of urinary tract infection.  MENTAL STATUS EXAMINATION ON ADMISSION: The patient is alert and oriented to person, place, time, and situation. He is pleasant, polite, and cooperative. He is cool and collected. He is in bed wearing hospital scrubs. He maintains good eye contact. His speech is soft.  Mood is depressed with flat affect. Thought processing is logical and goal oriented. Thought content: He endorses suicidal thoughts in response to command auditory hallucinations. He does not seem to be delusional or paranoid. His cognition is grossly intact. He registers three out of three and recalls three out of three objects after five minutes. He can spell world forward and backward. He knows the current president. His insight and judgment are fair.   SUICIDE RISK ASSESSMENT ON ADMISSION: This is a patient with a history of psychosis and mood instability who in the past did some cutting, but not within the past few years, who came to the hospital after three days of depressed mood and auditory command hallucinations to kill himself.   DIAGNOSES:  AXIS I: Schizoaffective disorder, bipolar type.   AXIS II: Deferred.   AXIS III: None.   AXIS IV: Mental illness.   AXIS V: Global Assessment of Functioning score on admission 25.   PLAN: The patient was admitted to [facility] for safety, stabilization and medication management. He was initially placed on suicide precautions and was closely monitored for any unsafe behaviors. He underwent full psychiatric and risk assessment. He received pharmacotherapy, individual and group psychotherapy, substance abuse counseling, and support from therapeutic milieu.   1. Suicidal ideation: The patient is able to contract for safety.  2. Mood psychosis: The patient in the past was treated with a mood stabilizer in addition to his antipsychotic. We will restart him on a mood stabilizer. Lamictal may be a good choice for him as he mostly is depressed.  3. We will treat insomnia with trazodone.  4. Disposition: He will be discharged to home with his mother. He will follow up with his ACT Team.   ____________________________ Wardell Honour. Maurice Ramseur, MD jbp:cbb D: 04/10/2012 17:30:07 ET T: 04/10/2012 17:53:09  ET JOB#: [id number]  cc: Sakshi Sermons B. Bary Leriche, MD, <Dictator> Clovis Fredrickson MD ELECTRONICALLY SIGNED 04/10/2012 23:10

## 2014-12-21 NOTE — Consult Note (Signed)
Brief Consult Note: Diagnosis: Paranoid schizophrenia.   Patient was seen by consultant.   Recommend further assessment or treatment.   Orders entered.   Comments: Will admitt to psychiatry.  Electronic Signatures: Orson Slick (MD)  (Signed 08-Aug-13 09:03)  Authored: Brief Consult Note   Last Updated: 08-Aug-13 09:03 by Orson Slick (MD)

## 2015-05-30 ENCOUNTER — Emergency Department
Admission: EM | Admit: 2015-05-30 | Discharge: 2015-05-31 | Disposition: A | Discharge status: Other Acute Inpatient Hospital | Payer: Medicare Other | Attending: Student | Admitting: Student

## 2015-05-30 DIAGNOSIS — F200 Paranoid schizophrenia: Secondary | ICD-10-CM | POA: Insufficient documentation

## 2015-05-30 DIAGNOSIS — Z720 Tobacco use: Secondary | ICD-10-CM | POA: Insufficient documentation

## 2015-05-30 DIAGNOSIS — R45851 Suicidal ideations: Secondary | ICD-10-CM | POA: Diagnosis present

## 2015-05-30 DIAGNOSIS — F1210 Cannabis abuse, uncomplicated: Secondary | ICD-10-CM | POA: Diagnosis not present

## 2015-05-30 LAB — CBC
HCT: 43.7 % (ref 40.0–52.0)
Hemoglobin: 15.2 g/dL (ref 13.0–18.0)
MCH: 31.1 pg (ref 26.0–34.0)
MCHC: 34.7 g/dL (ref 32.0–36.0)
MCV: 89.7 fL (ref 80.0–100.0)
PLATELETS: 254 10*3/uL (ref 150–440)
RBC: 4.88 MIL/uL (ref 4.40–5.90)
RDW: 14.4 % (ref 11.5–14.5)
WBC: 9.7 10*3/uL (ref 3.8–10.6)

## 2015-05-30 LAB — COMPREHENSIVE METABOLIC PANEL
ALT: 23 U/L (ref 17–63)
AST: 38 U/L (ref 15–41)
Albumin: 4.4 g/dL (ref 3.5–5.0)
Alkaline Phosphatase: 52 U/L (ref 38–126)
Anion gap: 7 (ref 5–15)
BILIRUBIN TOTAL: 0.7 mg/dL (ref 0.3–1.2)
BUN: 10 mg/dL (ref 6–20)
CALCIUM: 9.5 mg/dL (ref 8.9–10.3)
CO2: 29 mmol/L (ref 22–32)
Chloride: 104 mmol/L (ref 101–111)
Creatinine, Ser: 1.04 mg/dL (ref 0.61–1.24)
GFR calc Af Amer: >60 mL/min (ref >60)
Glucose, Bld: 83 mg/dL (ref 65–99)
POTASSIUM: 3.7 mmol/L (ref 3.5–5.1)
Sodium: 140 mmol/L (ref 135–145)
TOTAL PROTEIN: 7.8 g/dL (ref 6.5–8.1)

## 2015-05-30 LAB — URINE DRUG SCREEN, QUALITATIVE (ARMC ONLY)
AMPHETAMINES, UR SCREEN: NOT DETECTED
BARBITURATES, UR SCREEN: NOT DETECTED
BENZODIAZEPINE, UR SCRN: NOT DETECTED
CANNABINOID 50 NG, UR ~~LOC~~: POSITIVE — AB
Cocaine Metabolite,Ur ~~LOC~~: NOT DETECTED
MDMA (Ecstasy)Ur Screen: NOT DETECTED
Methadone Scn, Ur: NOT DETECTED
Opiate, Ur Screen: NOT DETECTED
PHENCYCLIDINE (PCP) UR S: NOT DETECTED
Tricyclic, Ur Screen: NOT DETECTED

## 2015-05-30 LAB — ACETAMINOPHEN LEVEL: Acetaminophen (Tylenol), Serum: <10 ug/mL — ABNORMAL LOW (ref 10–30)

## 2015-05-30 LAB — SALICYLATE LEVEL: Salicylate Lvl: <4 mg/dL (ref 2.8–30.0)

## 2015-05-30 LAB — ETHANOL

## 2015-05-30 NOTE — ED Notes (Signed)
Pt. Noted sleeping in room. No complaints or concerns voiced. No distress or abnormal behavior noted. Will continue to monitor with security cameras. Q 15 minute rounds continue. 

## 2015-05-30 NOTE — ED Provider Notes (Signed)
[facility] Emergency Department Provider Note  ____________________________________________  Time seen: Approximately 4:17 PM  I have reviewed the triage vital signs and the nursing notes.   HISTORY  Chief Complaint Suicide Attempt    HPI Jeffrey Coleman is a 34 y.o. male with history of schizophrenia who presents for evaluation of gradual onset suicidal ideation with plan of "suicide by cop". Patient reports that symptoms have been ongoing for at least 3 days since he "smoked a roach". He was in jail over the weekend for breaking and entereing, released today but is still thinking of killing himself. Currently his symptoms are moderate to severe. No modifying factors. No homicidal ideation or audiovisual hallucinations. No recent illness. No chest pain, difficulty breathing, nausea, vomiting, diarrhea, fevers or chills.   Past Medical History  Diagnosis Date  . Schizo affective schizophrenia     Patient Active Problem List   Diagnosis Date Noted  . Marijuana abuse 05/30/2015  . Adjustment disorder with mixed anxiety and depressed mood   . Paranoid schizophrenia 09/14/2014  . Sepsis 08/22/2014  . Tobacco dependence 08/22/2014  . SIRS (systemic inflammatory response syndrome) 08/17/2014  . SOB (shortness of breath) 08/16/2014  . Cavitary pneumonia 08/16/2014  . Schizoaffective schizophrenia 08/16/2014  . Cough   . Pyrexia   . Psychosis 07/28/2014    No past surgical history on file.  Current Outpatient Rx  Name  Route  Sig  Dispense  Refill  . ARIPiprazole (ABILIFY) 5 MG tablet   Oral   Take 1 tablet (5 mg total) by mouth daily as needed (FOR SEVERE ANXIETY/AGITATION). Patient not taking: Reported on 11/13/2014   15 tablet   0   . ARIPiprazole 400 MG SUSR   Intramuscular   Inject 400 mg into the muscle every 28 (twenty-eight) days. Next dose is due 10/07/2014   1 each   0   . benztropine (COGENTIN) 0.5 MG tablet   Oral   Take 1 tablet  (0.5 mg total) by mouth daily as needed for tremors. Patient not taking: Reported on 11/13/2014   30 tablet   0   . benztropine (COGENTIN) 1 MG tablet   Oral   Take 1 mg by mouth daily as needed for tremors.         Marland Kitchen doxycycline (VIBRAMYCIN) 100 MG capsule   Oral   Take 1 capsule (100 mg total) by mouth 2 (two) times daily. One po bid x 7 days Patient not taking: Reported on 12/16/2014   14 capsule   0   . hydrOXYzine (ATARAX/VISTARIL) 25 MG tablet   Oral   Take 1 tablet (25 mg total) by mouth 3 (three) times daily as needed for anxiety. Patient not taking: Reported on 11/13/2014   30 tablet   0   . traZODone (DESYREL) 50 MG tablet   Oral   Take 1 tablet (50 mg total) by mouth at bedtime as needed for sleep. Patient not taking: Reported on 11/13/2014   30 tablet   0     Allergies Review of patient's allergies indicates no known allergies.  Family History  Problem Relation Age of Onset  . Cancer Father     Social History Social History  Substance Use Topics  . Smoking status: Current Every Day Smoker -- 0.50 packs/day for 15 years    Types: Cigarettes  . Smokeless tobacco: Never Used  . Alcohol Use: Yes     Comment: occasional    Review of Systems Constitutional: No  fever/chills Eyes: No visual changes. ENT: No sore throat. Cardiovascular: Denies chest pain. Respiratory: Denies shortness of breath. Gastrointestinal: No abdominal pain.  No nausea, no vomiting.  No diarrhea.  No constipation. Genitourinary: Negative for dysuria. Musculoskeletal: Negative for back pain. Skin: Negative for rash. Neurological: Negative for headaches, focal weakness or numbness.  10-point ROS otherwise negative.  ____________________________________________   PHYSICAL EXAM:  VITAL SIGNS: ED Triage Vitals  Enc Vitals Group     BP 05/30/15 1501 118/74 mmHg     Pulse Rate 05/30/15 1500 67     Resp 05/30/15 1500 18     Temp 05/30/15 1500 98.3 F (36.8 C)     Temp  Source 05/30/15 1500 Oral     SpO2 05/30/15 1500 100 %     Weight 05/30/15 1500 135 lb (61.236 kg)     Height 05/30/15 1500 5\' 4"  (1.626 m)     Head Cir --      Peak Flow --      Pain Score --      Pain Loc --      Pain Edu? --      Excl. in [place]? --     Constitutional: Alert and oriented. Well appearing and in no acute distress. Eyes: Conjunctivae are normal. PERRL. EOMI. Head: Atraumatic. Nose: No congestion/rhinnorhea. Mouth/Throat: Mucous membranes are moist.  Oropharynx non-erythematous. Neck: No stridor.   Cardiovascular: Normal rate, regular rhythm. Grossly normal heart sounds.  Good peripheral circulation. Respiratory: Normal respiratory effort.  No retractions. Lungs CTAB. Gastrointestinal: Soft and nontender. No distention. No abdominal bruits. No CVA tenderness. Genitourinary: deferred Musculoskeletal: No lower extremity tenderness nor edema.  No joint effusions. Neurologic:  Normal speech and language. No gross focal neurologic deficits are appreciated. No gait instability. Skin:  Skin is warm, dry and intact. No rash noted. Psychiatric: Mood and affect are normal. Speech and behavior are normal.  ____________________________________________   LABS (all labs ordered are listed, but only abnormal results are displayed)  Labs Reviewed  ACETAMINOPHEN LEVEL - Abnormal; Notable for the following:    Acetaminophen (Tylenol), Serum <10 (*)    All other components within normal limits  URINE DRUG SCREEN, QUALITATIVE ([facility] ONLY) - Abnormal; Notable for the following:    Cannabinoid 50 Ng, Ur [place] POSITIVE (*)    All other components within normal limits  COMPREHENSIVE METABOLIC PANEL  ETHANOL  SALICYLATE LEVEL  CBC   ____________________________________________  EKG  none ____________________________________________  RADIOLOGY  none ____________________________________________   PROCEDURES  Procedure(s) performed: None  Critical Care performed:  No  ____________________________________________   INITIAL IMPRESSION / ASSESSMENT AND PLAN / ED COURSE  Pertinent labs & imaging results that were available during my care of the patient were reviewed by me and considered in my medical decision making (see chart for details).  Jeffrey Coleman is a 34 y.o. male with history of schizophrenia who presents for evaluation of gradual onset suicidal ideation with plan of "suicide by cop". On exam, he is generally well-appearing and in no acute distress. Vital signs stable, he is afebrile. He has no acute medical complaints and benign physical examination. Labs reviewed. Normal CBC and CMP. Negative ethanol level, negative salicylates, negative Tylenol level. Urine drug screen is only positive for cannabis. Given continued suicidality, will place involuntary commitment, consult psychiatry and behavioral health. He is medically cleared. ____________________________________________   FINAL CLINICAL IMPRESSION(S) / ED DIAGNOSES  Final diagnoses:  Paranoid schizophrenia  Suicidal ideation      Eryka A  Edd Fabian, MD 05/30/15 [id number]

## 2015-05-30 NOTE — ED Notes (Signed)
Pt to ed voluntary with reports of SI thoughts. Pt was arrested on Friday for breaking and entering while under the influence of drugs and was released on bond this am. Pt denies any thoughts of HI at this time.  Pt with his act team caregiver at this time.

## 2015-05-30 NOTE — Consult Note (Signed)
[facility] Face-to-Face Psychiatry Consult   Reason for Consult:  "I've been on a drug trip and I need some help" Referring Physician:  Inocencio Homes Patient Identification: Jeffrey Coleman MRN:  [id number] Principal Diagnosis: Paranoid schizophrenia Diagnosis:   Patient Active Problem List   Diagnosis Date Noted  . Marijuana abuse [F12.10] 05/30/2015  . Adjustment disorder with mixed anxiety and depressed mood [F43.23]   . Paranoid schizophrenia [F20.0] 09/14/2014  . Sepsis [A41.9] 08/22/2014  . Tobacco dependence [F17.200] 08/22/2014  . SIRS (systemic inflammatory response syndrome) [A41.9] 08/17/2014  . SOB (shortness of breath) [R06.02] 08/16/2014  . Cavitary pneumonia [J18.9, J98.4] 08/16/2014  . Schizoaffective schizophrenia [F25.0] 08/16/2014  . Cough [R05]   . Pyrexia [R50.9]   . Psychosis [F29] 07/28/2014    Total Time spent with patient: 1 hour  Subjective:   Jeffrey Coleman is a 34 y.o. male patient admitted with "I've been on a drug trip. I smoked a blood Went crazy".  HPI:  Information from the patient and the chart. Including commitment papers from act team. Patient reports this past Friday he found a roach in his bedroom. The kind you smoke, not the insect kind. He decided he would smoke it. After he did so he "went crazy". He was running around the street with no shoes on, running out into the woods, screaming and feeling paranoid. Started banging on the door of an elderly woman in the neighborhood screaming at her and told the police were called. Since then his mood is continued to be confused and disorganized. He has thoughts about wishing he were dead and wanting to die. He has been hallucinating constantly. He says that he realizes now that he needs some help with his drug problem. He claims that he uses every kind of drug you can get but the only thing positive in his drug screen is marijuana. He has been compliant with his psychiatric medicine. Recent major stress according to  him was that his brother was killed last month.  Past psychiatric history: Long history of schizophrenia. Has had several admissions before. Has frequently expressed suicidal thoughts before. Has made some suicide attempts by running out in front of traffic. He has been maintained on injectable medicine. Currently he is getting injectable Abilify once a month. Also has a history of substance abuse but has never been in any kind of substance abuse treatment program.  Medical history: Has had a past history of lung infections but currently no active ongoing medical problems.  Substance abuse history: Ongoing regular marijuana abuse as well as smoking. He says that he uses cocaine and drinks alcohol but none of that is in his system.  Social history: Not working. Lives with extended family especially his mother. He says that most of the time he tries to be useful around the neighborhood helping people with chores and he feels very guilty about having upset the neighbor whose house he was banging on. He says normally he thinks of her as being like a relative and he would never have want to upset her.  History: Positive for schizophrenia in 2 close relatives  Past Psychiatric History: Several past hospitalizations. Maintained on injectable medicine. Positive past history of suicide attempts  Risk to Self:   suicidal risk Risk to Others:   risk of violent or dangerous behavior when psychotic Prior Inpatient Therapy:   Prior Outpatient Therapy:    Past Medical History:  Past Medical History  Diagnosis Date  . Schizo affective schizophrenia  No past surgical history on file. Family History:  Family History  Problem Relation Age of Onset  . Cancer Father    Family Psychiatric  History: At least 2 close relatives with psychotic disorders no one in the family with known suicide. Social History:  History  Alcohol Use  . Yes    Comment: occasional     History  Drug Use  . Yes  . Special:  Marijuana    Comment: "not in a while"    Social History   Social History  . Marital Status: Single    Spouse Name: N/A  . Number of Children: N/A  . Years of Education: N/A   Social History Main Topics  . Smoking status: Current Every Day Smoker -- 0.50 packs/day for 15 years    Types: Cigarettes  . Smokeless tobacco: Never Used  . Alcohol Use: Yes     Comment: occasional  . Drug Use: Yes    Special: Marijuana     Comment: "not in a while"  . Sexual Activity: Not on file   Other Topics Concern  . Not on file   Social History Narrative   Additional Social History:                          Allergies:  No Known Allergies  Labs:  Results for orders placed or performed during the hospital encounter of 05/30/15 (from the past 48 hour(s))  Comprehensive metabolic panel     Status: None   Collection Time: 05/30/15  3:08 PM  Result Value Ref Range   Sodium 140 135 - 145 mmol/L   Potassium 3.7 3.5 - 5.1 mmol/L   Chloride 104 101 - 111 mmol/L   CO2 29 22 - 32 mmol/L   Glucose, Bld 83 65 - 99 mg/dL   BUN 10 6 - 20 mg/dL   Creatinine, Ser 1.04 0.61 - 1.24 mg/dL   Calcium 9.5 8.9 - 10.3 mg/dL   Total Protein 7.8 6.5 - 8.1 g/dL   Albumin 4.4 3.5 - 5.0 g/dL   AST 38 15 - 41 U/L   ALT 23 17 - 63 U/L   Alkaline Phosphatase 52 38 - 126 U/L   Total Bilirubin 0.7 0.3 - 1.2 mg/dL   GFR calc non Af Amer >60 >60 mL/min   GFR calc Af Amer >60 >60 mL/min    Comment: (NOTE) The eGFR has been calculated using the CKD EPI equation. This calculation has not been validated in all clinical situations. eGFR's persistently <60 mL/min signify possible Chronic Kidney Disease.    Anion gap 7 5 - 15  Ethanol (ETOH)     Status: None   Collection Time: 05/30/15  3:08 PM  Result Value Ref Range   Alcohol, Ethyl (B) <5 <5 mg/dL    Comment:        LOWEST DETECTABLE LIMIT FOR SERUM ALCOHOL IS 5 mg/dL FOR MEDICAL PURPOSES ONLY   Salicylate level     Status: None   Collection Time:  05/30/15  3:08 PM  Result Value Ref Range   Salicylate Lvl <6.2 2.8 - 30.0 mg/dL  Acetaminophen level     Status: Abnormal   Collection Time: 05/30/15  3:08 PM  Result Value Ref Range   Acetaminophen (Tylenol), Serum <10 (L) 10 - 30 ug/mL    Comment:        THERAPEUTIC CONCENTRATIONS VARY SIGNIFICANTLY. A RANGE OF 10-30 ug/mL MAY BE AN EFFECTIVE CONCENTRATION  FOR MANY PATIENTS. HOWEVER, SOME ARE BEST TREATED AT CONCENTRATIONS OUTSIDE THIS RANGE. ACETAMINOPHEN CONCENTRATIONS >150 ug/mL AT 4 HOURS AFTER INGESTION AND >50 ug/mL AT 12 HOURS AFTER INGESTION ARE OFTEN ASSOCIATED WITH TOXIC REACTIONS.   CBC     Status: None   Collection Time: 05/30/15  3:08 PM  Result Value Ref Range   WBC 9.7 3.8 - 10.6 K/uL   RBC 4.88 4.40 - 5.90 MIL/uL   Hemoglobin 15.2 13.0 - 18.0 g/dL   HCT 43.7 40.0 - 52.0 %   MCV 89.7 80.0 - 100.0 fL   MCH 31.1 26.0 - 34.0 pg   MCHC 34.7 32.0 - 36.0 g/dL   RDW 14.4 11.5 - 14.5 %   Platelets 254 150 - 440 K/uL  Urine Drug Screen, Qualitative ([facility] only)     Status: Abnormal   Collection Time: 05/30/15  3:08 PM  Result Value Ref Range   Tricyclic, Ur Screen NONE DETECTED NONE DETECTED   Amphetamines, Ur Screen NONE DETECTED NONE DETECTED   MDMA (Ecstasy)Ur Screen NONE DETECTED NONE DETECTED   Cocaine Metabolite,Ur [place] NONE DETECTED NONE DETECTED   Opiate, Ur Screen NONE DETECTED NONE DETECTED   Phencyclidine (PCP) Ur S NONE DETECTED NONE DETECTED   Cannabinoid 50 Ng, Ur [place] POSITIVE (A) NONE DETECTED   Barbiturates, Ur Screen NONE DETECTED NONE DETECTED   Benzodiazepine, Ur Scrn NONE DETECTED NONE DETECTED   Methadone Scn, Ur NONE DETECTED NONE DETECTED    Comment: (NOTE) 161  Tricyclics, urine               Cutoff 1000 ng/mL 200  Amphetamines, urine             Cutoff 1000 ng/mL 300  MDMA (Ecstasy), urine           Cutoff 500 ng/mL 400  Cocaine Metabolite, urine       Cutoff 300 ng/mL 500  Opiate, urine                   Cutoff 300 ng/mL 600   Phencyclidine (PCP), urine      Cutoff 25 ng/mL 700  Cannabinoid, urine              Cutoff 50 ng/mL 800  Barbiturates, urine             Cutoff 200 ng/mL 900  Benzodiazepine, urine           Cutoff 200 ng/mL 1000 Methadone, urine                Cutoff 300 ng/mL 1100 1200 The urine drug screen provides only a preliminary, unconfirmed 1300 analytical test result and should not be used for non-medical 1400 purposes. Clinical consideration and professional judgment should 1500 be applied to any positive drug screen result due to possible 1600 interfering substances. A more specific alternate chemical method 1700 must be used in order to obtain a confirmed analytical result.  1800 Gas chromato graphy / mass spectrometry (GC/MS) is the preferred 1900 confirmatory method.     No current facility-administered medications for this encounter.   Current Outpatient Prescriptions  Medication Sig Dispense Refill  . ARIPiprazole (ABILIFY) 5 MG tablet Take 1 tablet (5 mg total) by mouth daily as needed (FOR SEVERE ANXIETY/AGITATION). (Patient not taking: Reported on 11/13/2014) 15 tablet 0  . ARIPiprazole 400 MG SUSR Inject 400 mg into the muscle every 28 (twenty-eight) days. Next dose is due 10/07/2014 1 each 0  . benztropine (COGENTIN) 0.5  MG tablet Take 1 tablet (0.5 mg total) by mouth daily as needed for tremors. (Patient not taking: Reported on 11/13/2014) 30 tablet 0  . benztropine (COGENTIN) 1 MG tablet Take 1 mg by mouth daily as needed for tremors.    Marland Kitchen doxycycline (VIBRAMYCIN) 100 MG capsule Take 1 capsule (100 mg total) by mouth 2 (two) times daily. One po bid x 7 days (Patient not taking: Reported on 12/16/2014) 14 capsule 0  . hydrOXYzine (ATARAX/VISTARIL) 25 MG tablet Take 1 tablet (25 mg total) by mouth 3 (three) times daily as needed for anxiety. (Patient not taking: Reported on 11/13/2014) 30 tablet 0  . traZODone (DESYREL) 50 MG tablet Take 1 tablet (50 mg total) by mouth at bedtime as  needed for sleep. (Patient not taking: Reported on 11/13/2014) 30 tablet 0    Musculoskeletal: Strength & Muscle Tone: within normal limits Gait & Station: normal Patient leans: N/A  Psychiatric Specialty Exam: Review of Systems  Constitutional: Negative.   HENT: Negative.   Eyes: Negative.   Respiratory: Negative.   Cardiovascular: Negative.   Gastrointestinal: Negative.   Musculoskeletal: Negative.   Skin: Negative.   Neurological: Negative.   Psychiatric/Behavioral: Positive for depression, suicidal ideas, hallucinations, memory loss and substance abuse. The patient is nervous/anxious and has insomnia.     Blood pressure 130/81, pulse 88, temperature 97.6 F (36.4 C), temperature source Oral, resp. rate 20, height $RemoveBe'5\' 4"'EGEORMSEV$  (1.626 m), weight 61.236 kg (135 lb), SpO2 97 %.Body mass index is 23.16 kg/(m^2).  General Appearance: Casual  Eye Contact::  Good  Speech:  Clear and Coherent  Volume:  Normal  Mood:  Depressed  Affect:  Depressed  Thought Process:  Goal Directed  Orientation:  Full (Time, Place, and Person)  Thought Content:  Hallucinations: Auditory  Suicidal Thoughts:  Yes.  with intent/plan  Homicidal Thoughts:  No  Memory:  Immediate;   Fair Recent;   Fair Remote;   Fair  Judgement:  Fair  Insight:  Fair  Psychomotor Activity:  Decreased  Concentration:  Fair  Recall:  [employer] of Knowledge:Fair  Language: Fair  Akathisia:  No  Handed:  Right  AIMS (if indicated):     Assets:  Communication Skills Desire for Improvement Financial Resources/Insurance Housing Leisure Time Physical Health Resilience Social Support  ADL's:  Intact  Cognition: WNL  Sleep:      Treatment Plan Summary: Daily contact with patient to assess and evaluate symptoms and progress in treatment, Medication management and Plan Patient with recent serious psychosis and suicidal behavior continues to have suicidal thoughts. Wants to come into the hospital and be stabilize. Patient  will be admitted to psychiatry ward. 15 minute checks. Continue Cogentin. He is not yet due for his injectable medicine. Check all labs including review current labs and check hemoglobin A1c.  Disposition: Recommend psychiatric Inpatient admission when medically cleared. Supportive therapy provided about ongoing stressors.  Kaetlyn Noa Clapacs 05/30/2015 9:33 PM

## 2015-05-30 NOTE — ED Notes (Addendum)
Pt. To [facility] from ED ambulatory without difficulty, to room  . Report from Joseph. Pt. Is alert and oriented, warm and dry in no distress. Pt. Denies HI, and AVH. Pt. States he still has SI with a plan to get police to shoot him.Pt. Calm and cooperative. Pt. Made aware of security cameras and Q15 minute rounds. Pt. Encouraged to let Nursing staff know of any concerns or needs.

## 2015-05-30 NOTE — ED Notes (Signed)
Pt. Noted in day room. No complaints or concerns voiced. No distress or abnormal behavior noted. Will continue to monitor with security cameras. Q 15 minute rounds continue. 

## 2015-05-30 NOTE — ED Notes (Signed)
Pt. Noted in room. No complaints or concerns voiced. No distress or abnormal behavior noted. Will continue to monitor with security cameras. Q 15 minute rounds continue. 

## 2015-05-31 ENCOUNTER — Encounter: Payer: Self-pay | Admitting: Psychiatry

## 2015-05-31 ENCOUNTER — Inpatient Hospital Stay
Admit: 2015-05-31 | Discharge: 2015-06-03 | DRG: 885 | Disposition: A | Discharge status: Home / Self Care | Payer: Medicare Other | Attending: Psychiatry | Admitting: Psychiatry

## 2015-05-31 DIAGNOSIS — R45851 Suicidal ideations: Secondary | ICD-10-CM | POA: Diagnosis present

## 2015-05-31 DIAGNOSIS — F17210 Nicotine dependence, cigarettes, uncomplicated: Secondary | ICD-10-CM | POA: Diagnosis present

## 2015-05-31 DIAGNOSIS — Z818 Family history of other mental and behavioral disorders: Secondary | ICD-10-CM | POA: Diagnosis not present

## 2015-05-31 DIAGNOSIS — F17200 Nicotine dependence, unspecified, uncomplicated: Secondary | ICD-10-CM | POA: Diagnosis present

## 2015-05-31 DIAGNOSIS — Z79899 Other long term (current) drug therapy: Secondary | ICD-10-CM | POA: Diagnosis not present

## 2015-05-31 DIAGNOSIS — Z809 Family history of malignant neoplasm, unspecified: Secondary | ICD-10-CM | POA: Diagnosis not present

## 2015-05-31 DIAGNOSIS — F1220 Cannabis dependence, uncomplicated: Secondary | ICD-10-CM | POA: Diagnosis present

## 2015-05-31 DIAGNOSIS — F200 Paranoid schizophrenia: Principal | ICD-10-CM

## 2015-05-31 LAB — TSH: TSH: 0.611 u[IU]/mL (ref 0.350–4.500)

## 2015-05-31 LAB — LIPID PANEL
CHOL/HDL RATIO: 4.2 ratio
Cholesterol: 167 mg/dL (ref 0–200)
HDL: 40 mg/dL — ABNORMAL LOW (ref >40)
LDL Cholesterol: 113 mg/dL — ABNORMAL HIGH (ref 0–99)
Triglycerides: 68 mg/dL (ref <150)
VLDL: 14 mg/dL (ref 0–40)

## 2015-05-31 LAB — HEMOGLOBIN A1C: HEMOGLOBIN A1C: 4.9 % (ref 4.0–6.0)

## 2015-05-31 MED ORDER — HALOPERIDOL LACTATE 5 MG/ML IJ SOLN
5.0000 mg | Freq: Once | INTRAMUSCULAR | Status: AC
  Administered 2015-05-31: 5 mg via INTRAMUSCULAR
  Filled 2015-05-31: qty 1

## 2015-05-31 MED ORDER — OLANZAPINE 10 MG PO TABS
10.0000 mg | ORAL_TABLET | Freq: Two times a day (BID) | ORAL | Status: DC
  Administered 2015-05-31 – 2015-06-03 (×7): 10 mg via ORAL
  Filled 2015-05-31 (×7): qty 1

## 2015-05-31 MED ORDER — ACETAMINOPHEN 325 MG PO TABS: 650.0000 mg | ORAL_TABLET | Freq: Four times a day (QID) | ORAL | Status: DC | PRN

## 2015-05-31 MED ORDER — LORAZEPAM 2 MG/ML IJ SOLN
1.0000 mg | Freq: Once | INTRAMUSCULAR | Status: AC
  Administered 2015-05-31: 1 mg via INTRAMUSCULAR
  Filled 2015-05-31: qty 1

## 2015-05-31 MED ORDER — MAGNESIUM HYDROXIDE 400 MG/5ML PO SUSP: 30.0000 mL | Freq: Every day | ORAL | Status: DC | PRN

## 2015-05-31 MED ORDER — BENZTROPINE MESYLATE 1 MG PO TABS: 1.0000 mg | ORAL_TABLET | Freq: Every day | ORAL | Status: AC

## 2015-05-31 MED ORDER — ALUM & MAG HYDROXIDE-SIMETH 200-200-20 MG/5ML PO SUSP
30.0000 mL | ORAL | Status: DC | PRN
Start: 2015-05-31 — End: 2015-06-03

## 2015-05-31 MED ORDER — BENZTROPINE MESYLATE 1 MG/ML IJ SOLN
1.0000 mg | Freq: Every day | INTRAMUSCULAR | Status: AC
  Administered 2015-05-31: 1 mg via INTRAMUSCULAR
  Filled 2015-05-31: qty 1

## 2015-05-31 NOTE — Plan of Care (Signed)
Problem: Consults Goal: [facility] General Treatment Patient Education Outcome: Progressing Education initiated, pt in acceptance and agrees to follow recommendations.

## 2015-05-31 NOTE — [facility] Group Notes (Signed)
[place] Group Notes:  (Nursing/MHT/Case Management/Adjunct)  Date:  05/31/2015  Time:  2:07 PM  Type of Therapy:  Psychoeducational Skills  Participation Level:  Active  Participation Quality:  Attentive  Affect:  Appropriate  Cognitive:  Appropriate  Insight:  Appropriate  Engagement in Group:  Engaged  Modes of Intervention:  Discussion, Education and Support  Summary of Progress/Problems:  Adela Lank Madoni 05/31/2015, 2:07 PM
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
## 2015-05-31 NOTE — Progress Notes (Signed)
D:Patient found standing in corner of bathroom. Voice to staff he was seeing and hearing things . Patient faced the corner of bathroom  Noted to stare up to the ceiling . Noted to cry. Continue  To cry and voice to wall " run , run  Run" Patient continue to talk in an unknown tongue  Noted to hit wall  Far a period . Encourage to stop . Patient did so.  No eye contact. Patient  Noted to sit in floor   Continue to face the wall. . Staff notified Dr. Weber Cooks . Medication order received . Thought process remained disorganized  A:Patient got an injection of haldol, ativan and cogentin. Patient receptive to medication received .Patient assisted back to his bed . Patient said to writer very clearly "This medication ain't working". Five minutes later patient out of room  Walking down hall. Patient came to nursing stations requesting paper and pen to write. R: Staff continue to monitor patient behavior. Patient calm and cooperative.

## 2015-05-31 NOTE — Tx Team (Signed)
Interdisciplinary Treatment Plan Update (Adult)  Date:  05/31/2015 Time Reviewed:  1:25 PM  Progress in Treatment: Attending groups: No. Participating in groups:  No. Taking medication as prescribed:  Yes. Tolerating medication:  Yes. Family/Significant othe contact made:   Patient understands diagnosis:  yes Discussing patient identified problems/goals with staff:  Yes. Medical problems stabilized or resolved:  Yes. Denies suicidal/homicidal ideation: Yes. Issues/concerns per patient self-inventory:  No. Other:  New problem(s) identified: None  Discharge Plan or Barriers: Patient will discharge home and follow up with his ACT team  Reason for Continuation of Hospitalization: Medication stabilization, reduce paranoia  Comments:  Estimated length of stay:2-3 days  New goal(s):  Review of initial/current patient goals per problem list:    Refer to plan of care Attendees: Patient:  Jeffrey Coleman 9/27/20161:25 PM  Family:   9/27/20161:25 PM  Physician:  Dr Bary Leriche 9/27/20161:25 PM  Nursing:   Orene Desanctis RN 9/27/20161:25 PM  Case Manager:  Enis Slipper LCSW 9/27/20161:25 PM  Counselor:  Drue Flirt LRT 9/27/20161:25 PM  Other:   9/27/20161:25 PM  Other:   9/27/20161:25 PM  Other:   9/27/20161:25 PM  Other:  9/27/20161:25 PM  Other:  9/27/20161:25 PM  Other:  9/27/20161:25 PM  Other:  9/27/20161:25 PM  Other:  9/27/20161:25 PM  Other:  9/27/20161:25 PM  Other:   9/27/20161:25 PM   Scribe for Treatment Team:   Joana Reamer, 05/31/2015, 1:25 PM

## 2015-05-31 NOTE — [facility] Suicide Risk Assessment (Addendum)
[facility] Admission Suicide Risk Assessment   Nursing information obtained from:    Demographic factors:    Current Mental Status:    Loss Factors:    Historical Factors:    Risk Reduction Factors:    Total Time spent with patient: 1 hour Principal Problem: Schizophrenia, paranoid Diagnosis:   Patient Active Problem List   Diagnosis Date Noted  . Schizophrenia, paranoid [F20.0] 05/31/2015  . Cannabis use disorder, severe, dependence [F12.20] 05/30/2015  . Adjustment disorder with mixed anxiety and depressed mood [F43.23]   . Sepsis [A41.9] 08/22/2014  . Tobacco use disorder [Z72.0] 08/22/2014  . SIRS (systemic inflammatory response syndrome) [A41.9] 08/17/2014  . SOB (shortness of breath) [R06.02] 08/16/2014  . Cavitary pneumonia [J18.9, J98.4] 08/16/2014  . Cough [R05]   . Pyrexia [R50.9]      Continued Clinical Symptoms:  Alcohol Use Disorder Identification Test Final Score (AUDIT): 24 The "Alcohol Use Disorders Identification Test", Guidelines for Use in Primary Care, Second Edition.  World Pharmacologist [facility]). Score between 0-7:  no or low risk or alcohol related problems. Score between 8-15:  moderate risk of alcohol related problems. Score between 16-19:  high risk of alcohol related problems. Score 20 or above:  warrants further diagnostic evaluation for alcohol dependence and treatment.   CLINICAL FACTORS:   Severe Anxiety and/or Agitation Alcohol/Substance Abuse/Dependencies Schizophrenia:   Command hallucinatons Depressive state Less than 49 years old Paranoid or undifferentiated type   Musculoskeletal: Strength & Muscle Tone: within normal limits Gait & Station: normal Patient leans: N/A  Psychiatric Specialty Exam: I reviewed physical examination performed in the emergency room and agree with the findings. Physical Exam  Nursing note and vitals reviewed.   Review of Systems  All other systems reviewed and are negative.   Blood pressure 124/90,  pulse 58, temperature 99 F (37.2 C), temperature source Oral, resp. rate 20, height 5\' 4"  (1.626 m), weight 60.328 kg (133 lb), SpO2 99 %.Body mass index is 22.82 kg/(m^2).  General Appearance: Casual  Eye Contact::  Minimal  Speech:  Clear and Coherent  Volume:  Normal  Mood:  Anxious  Affect:  Labile  Thought Process:  Goal Directed  Orientation:  Full (Time, Place, and Person)  Thought Content:  Delusions, Hallucinations: Command:  Telling him that people are out to get him and Paranoid Ideation  Suicidal Thoughts:  Yes.  with intent/plan  Homicidal Thoughts:  No  Memory:  Immediate;   Fair Recent;   Fair Remote;   Fair  Judgement:  Impaired  Insight:  Shallow  Psychomotor Activity:  Increased  Concentration:  Fair  Recall:  [place]: Fair  Akathisia:  No  Handed:  Right  AIMS (if indicated):     Assets:  Communication Skills Desire for Improvement Financial Resources/Insurance Housing Physical Health Resilience  Sleep:     Cognition: WNL  ADL's:  Intact     COGNITIVE FEATURES THAT CONTRIBUTE TO RISK:  None    SUICIDE RISK:   Moderate:  Frequent suicidal ideation with limited intensity, and duration, some specificity in terms of plans, no associated intent, good self-control, limited dysphoria/symptomatology, some risk factors present, and identifiable protective factors, including available and accessible social support.  PLAN OF CARE: Hospital admission, medication management, substance abuse counseling, discharge planning.  Medical Decision Making:  New problem, with additional work up planned, Review of Psycho-Social Stressors (1), Review or order clinical lab tests (1), Review of Medication Regimen & Side Effects (2) and  Review of New Medication or Change in Dosage (2)   Jeffrey Coleman is a 34 year old male with a history of schizophrenia and substance admitting for psychotic agitation and suicidal ideation in the context of  substance use.  1. Suicidal ideation. The patient is able to contract for safety in the hospital.  2. Psychosis. He has been maintained on monthly injections of Abilify Maintena. He tells me that his last injection was not long ago and he is not due for the next one yet. He was given Haldol for agitation in the emergency room but does not believe that it's been helpful. He complains of loud and disturbing auditory command hallucinations. We will start Zyprexa.  3. Substance abuse. The patient is a daily marijuana smoker and it does not bother him in the least. On Friday he ingested substances that made him agitated and psychotic. His drug screen is only positive for marijuana. The patient feels that he needs help with substance abuse.  4. Smoking. Nicotine patch is available.  5. Disposition. He will likely be discharged to home with family. He will follow-up with his ACT team.     I certify that inpatient services furnished can reasonably be expected to improve the patient's condition.   Samyia Motter 05/31/2015, 11:57 AM

## 2015-05-31 NOTE — Progress Notes (Signed)
Admission Note:  34 yr male who presents IVC in no acute distress for the treatment of SI and Depression. Pt appears flat and depressed. Pt was calm and cooperative with admission process. Pt presents with passive SI and contracts for safety upon admission. Pt denies HI/AVH . Pt was recently released from jail for breaking and entering. He said his trigger is his 34yo brother was killed last month and the suspect is unapprehended. Pt is paranoid about this and said his brothers killer might come out for him Skin was assessed and found to be clear of any abnormal marks or impediments. PT searched and belonging searched and no contraband found, POC and unit policies explained and understanding verbalized. Consents obtained. Food and fluids offered, and fluids accepted. Pt had no additional questions or concerns.

## 2015-05-31 NOTE — Progress Notes (Addendum)
D: Patient remains calm and cooperative . Out of room limited interaction with peers and staff.  Appetite good and voice no concerns  with sleep. Patient question if he could go to [place].  Will discuss tomorrow  With MD A Encourage patient participation with unit programming .Encourage patient to come to staff for any concerns Irnstructions given on medication verbalizing understanding  R: Receptive to information received, voice no other concerns

## 2015-05-31 NOTE — BH Assessment (Signed)
Assessment Note  Jeffrey Coleman is an 34 y.o. male. Jeffrey Coleman states that he is having suicidal thoughts, and problems with drug addictions.  Jeffrey Coleman reports that he has a history of mental health concerns.  He states that he had "a really bad trip"  and that he really wants some help. He reports that his brother was murdered about 2 months ago. He states that "the killer is still at large" he further stated that "I felt the guy was looking for me".  He reports symptoms of depression, sadness, stating that his "heart feels heavy".  He denied symptoms of anxiety.  He reports that he does have hallucinations, but since he has been on abilify (6 months) he has not had any hallucinations.  He denied homicidal ideation or intent.  He states that he is having suicidal ideation and intent. He states his method would be "suicide by cop". He reports using Alcohol, Marijuana and Crack.  His UDS tested positive for Marijuana.   Diagnosis: Paranoid Schizophrenia  Past Medical History:  Past Medical History  Diagnosis Date  . Schizo affective schizophrenia     No past surgical history on file.  Family History:  Family History  Problem Relation Age of Onset  . Cancer Father     Social History:  reports that he has been smoking Cigarettes.  He has a 7.5 pack-year smoking history. He has never used smokeless tobacco. He reports that he drinks alcohol. He reports that he uses illicit drugs (Marijuana).  Additional Social History:  Alcohol / Drug Use History of alcohol / drug use?: Yes Substance #1 Name of Substance 1: Crack 1 - Age of First Use: 19 1 - Amount (size/oz): $80 1 - Frequency: Rare 1 - Last Use / Amount: 2 months ago Substance #2 Name of Substance 2: Marijuana 2 - Age of First Use: 15 2 - Amount (size/oz): 1 blunt 2 - Frequency: Daily 2 - Last Use / Amount: 05/27/2015 Substance #3 Name of Substance 3: Alcohol 3 - Age of First Use: 13 3 - Amount (size/oz): 1 pint 3 -  Frequency: 2 times a week 3 - Last Use / Amount: 05/17/2015  CIWA: CIWA-Ar BP: 130/81 mmHg Pulse Rate: 88 COWS:    Allergies: No Known Allergies  Home Medications:  (Not in a hospital admission)  OB/GYN Status:  No LMP for male patient.  General Assessment Data Location of Assessment: [facility] ED TTS Assessment: In system Is this a Tele or Face-to-Face Assessment?: Face-to-Face Is this an Initial Assessment or a Re-assessment for this encounter?: Initial Assessment Marital status: Single Is patient pregnant?: No Pregnancy Status: No Living Arrangements: Parent, Other relatives Can pt return to current living arrangement?: Yes Admission Status: Voluntary Is patient capable of signing voluntary admission?: Yes Referral Source: Other Insurance type: Medicare  Medical Screening Exam ([place]) Medical Exam completed: Yes  Crisis Care Plan Living Arrangements: Parent, Other relatives Name of Psychiatrist: [name] - [facility] Name of Therapist: [name] -ACT team  Education Status Is patient currently in school?: No Current Grade: n/a Highest grade of school patient has completed: GED Name of school: n/a Contact person: n/a  Risk to self with the past 6 months Suicidal Ideation: Yes-Currently Present Has patient been a risk to self within the past 6 months prior to admission? : Yes Suicidal Intent: Yes-Currently Present Has patient had any suicidal intent within the past 6 months prior to admission? : Yes Is patient at risk for  suicide?: Yes Suicidal Plan?: Yes-Currently Present Has patient had any suicidal plan within the past 6 months prior to admission? : Yes Specify Current Suicidal Plan: suicide by cop Access to Means: Yes What has been your use of drugs/alcohol within the last 12 months?: use of marijuan, crack, and alcohol Previous Attempts/Gestures: Yes How many times?: 4 Other Self Harm Risks: yes Triggers for Past Attempts:  Unknown Intentional Self Injurious Behavior: Cutting, Burning Family Suicide History: No Recent stressful life event(s):  (Brother murdered, drug usage) Persecutory voices/beliefs?: No Depression: Yes Depression Symptoms: Despondent, Loss of interest in usual pleasures ("Heart feels heavy") Substance abuse history and/or treatment for substance abuse?: Yes ([facility] - 90 day treatment) Suicide prevention information given to non-admitted patients: Not applicable  Risk to Others within the past 6 months Homicidal Ideation: No-Not Currently/Within Last 6 Months Does patient have any lifetime risk of violence toward others beyond the six months prior to admission? : No Thoughts of Harm to Others: No Current Homicidal Intent: No Current Homicidal Plan: No Access to Homicidal Means: No Identified Victim: None reported History of harm to others?: No Assessment of Violence: None Noted Violent Behavior Description: none Does patient have access to weapons?: No Criminal Charges Pending?: Yes Describe Pending Criminal Charges: B&E -  Patient was "high" and was banging on a neighbor's door Does patient have a court date: No Is patient on probation?: No  Psychosis Hallucinations: None noted Delusions:  (Paranoid)  Mental Status Report Appearance/Hygiene: In scrubs Eye Contact: Fair Motor Activity: Restlessness Speech: Logical/coherent Level of Consciousness: Alert Mood: Euthymic Affect: Appropriate to circumstance Anxiety Level: Minimal Thought Processes: Tangential Judgement: Unable to Assess Orientation: Person, Place, Situation Obsessive Compulsive Thoughts/Behaviors: Minimal  Cognitive Functioning Concentration: Unable to Assess Memory: Recent Intact Insight: Fair Impulse Control: Fair Appetite: Good Sleep: No Change Vegetative Symptoms: None  ADLScreening [facility] Assessment Services) Patient's cognitive ability adequate to safely complete daily activities?:  Yes Patient able to express need for assistance with ADLs?: Yes Independently performs ADLs?: Yes (appropriate for developmental age)  Prior Inpatient Therapy Prior Inpatient Therapy: Yes Prior Therapy Dates: November 2015 Prior Therapy Facilty/Provider(s): [facility] Reason for Treatment: Suicidal thoughts  Prior Outpatient Therapy Prior Outpatient Therapy: Yes Prior Therapy Dates: Current Prior Therapy Facilty/Provider(s): [facility] Reason for Treatment: Schizophrenia Does patient have an ACCT team?: Yes Does patient have Intensive In-House Services?  : No Does patient have [facility] services? : No Does patient have P[facility] services?: No  ADL Screening (condition at time of admission) Patient's cognitive ability adequate to safely complete daily activities?: Yes Patient able to express need for assistance with ADLs?: Yes Independently performs ADLs?: Yes (appropriate for developmental age)       Abuse/Neglect Assessment (Assessment to be complete while patient is alone) Physical Abuse: Denies Verbal Abuse: Denies Sexual Abuse: Denies Exploitation of patient/patient's resources: Denies Self-Neglect: Denies Values / Beliefs Cultural Requests During Hospitalization: None Spiritual Requests During Hospitalization: None        Additional Information 1:1 In Past 12 Months?: No CIRT Risk: No Elopement Risk: No Does patient have medical clearance?: Yes     Disposition:  Disposition Initial Assessment Completed for this Encounter: Yes Disposition of Patient: Inpatient treatment program Type of inpatient treatment program: Adult  On Site Evaluation by:   Reviewed with Physician:    Elmer Bales 05/31/2015 12:17 AM

## 2015-05-31 NOTE — ED Notes (Signed)
Pt. Noted sleeping in room. No complaints or concerns voiced. No distress or abnormal behavior noted. Will continue to monitor with security cameras. Q 15 minute rounds continue. 

## 2015-05-31 NOTE — Plan of Care (Signed)
Problem: Ineffective individual coping Goal: LTG: Patient will report a decrease in negative feelings Outcome: Not Progressing Patient unable to focus on positive feeling

## 2015-05-31 NOTE — Progress Notes (Signed)
Recreation Therapy Notes  INPATIENT RECREATION THERAPY ASSESSMENT  Patient Details Name: Jeffrey Coleman MRN: [id number] DOB: 01/04/1981 Today's Date: 06-20-2015  Patient Stressors: Death (Brother was just murdered)  Coping Skills:   Isolate, Substance Abuse, Avoidance, Self-Injury, Exercise, Art/Dance, Talking, Music, Sports, Other (Comment) (Reading, writing poetry)  Personal Challenges: Anger, Communication, Concentration, Decision-Making, Expressing Yourself, Problem-Solving, Relationships, Self-Esteem/Confidence, Social Interaction, Stress Management, Substance Abuse, Time Management, Trusting Others  Leisure Interests (2+):  Individual - Other (Comment), Individual - TV (Write poetry)  Awareness of Community Resources:  No  Community Resources:     Current Use:    If no, Barriers?:    Patient Strengths:  "I'm breathing", sense of humor  Patient Identified Areas of Improvement:  Drug addiction  Current Recreation Participation:  Using drugs  Patient Goal for Hospitalization:  To have a better outlook on drug use  City of Residence:  [place] of Residence:  [place]   Current SI (including self-harm):  No  Current HI:  No  Consent to Intern Participation: N/A   Leonette [facility]arch, LRT/CTRS 06-20-15, 6:16 PM

## 2015-05-31 NOTE — Progress Notes (Signed)
Recreation Therapy Notes  Date: 09.27.16 Time: 3:00 pm Location: Craft Room  Group Topic: Goal Setting  Goal Area(s) Addresses:  Patient will write at least one goal. Patient will write at least one obstacle.  Behavioral Response: Attentive, Interactive  Intervention: Recovery Goal Chart  Activity: Patients were instructed to make a goal chart listing goals, obstacles, the date they started working on their goals, and the date they have achieved their goals.   Education: LRT educated patients on healthy ways to celebrate reaching their goals.  Education Outcome: In group clarification offered  Clinical Observations/Feedback: Patient completed activity by listing 3 goals, obstacles, and the date he started working on his goals. Patient contributed to group discussion by stating he had people to help him focus on his goals, that it was important to celebrate reaching his goals and why, and healthy ways he could celebrate reaching his goals.  Leonette Monarch, LRT/CTRS 05/31/2015 4:30 PM

## 2015-05-31 NOTE — Tx Team (Signed)
Initial Interdisciplinary Treatment Plan   PATIENT STRESSORS: Legal Problems Financial problems Substance abuse problems   PATIENT STRENGTHS: Ability for insight Communication skills Supportive family/friends   PROBLEM LIST: Problem List/Patient Goals Date to be addressed Date deferred Reason deferred Estimated date of resolution  Depression      Suicide Ideation      Legal problems      Flash backs of dead brother                                     DISCHARGE CRITERIA:  Ability to meet basic life and health needs Adequate post-discharge living arrangements Improved stabilization in mood, thinking, and/or behavior  PRELIMINARY DISCHARGE PLAN: Attend aftercare/continuing care group Outpatient therapy  PATIENT/FAMIILY INVOLVEMENT: This treatment plan has been presented to and reviewed with the patient, Jeffrey Coleman.   [name] 05/31/2015, 5:52 AM

## 2015-06-01 NOTE — Plan of Care (Signed)
Problem: Ineffective individual coping Goal: LTG: Patient will report a decrease in negative feelings Outcome: Progressing Pt is progressing towards achieving this goal.

## 2015-06-01 NOTE — BHH Group Notes (Signed)
[facility] LCSW Group Therapy  06/01/2015 2:32 PM  Type of Therapy:  Group Therapy  Participation Level:  Active  Participation Quality:  Appropriate and Attentive  Affect:  Appropriate  Cognitive:  Alert, Appropriate and Oriented  Insight:  Engaged  Engagement in Therapy:  Engaged  Modes of Intervention:  Discussion, Socialization and Support  Summary of Progress/Problems: Patient introduced and participated in group discussion appropriately. Patient shared that he has family support which helps him cope with his symptoms as well as his faith and support system including his ACT team.   [name], MSW, [place] 06/01/2015, 2:32 PM

## 2015-06-01 NOTE — Progress Notes (Signed)
[facility] MD Progress Note  06/01/2015 11:43 AM DESMON HITCHNER  MRN:  [id number]  Subjective:  Mr. Rekowski feels much better today. Agitation resolved. Auditory hallucinations are much improved with Zyprexa. He has no somatic complaints. He participates in groups. He is still very much interested in residential substance abuse treatment program participation. I spoke with [name] act team leader who recommended substance abuse treatment. We will refer him to [facility] program.  Principal Problem: Schizophrenia, paranoid Diagnosis:   Patient Active Problem List   Diagnosis Date Noted  . Schizophrenia, paranoid [F20.0] 05/31/2015  . Cannabis use disorder, severe, dependence [F12.20] 05/30/2015  . Adjustment disorder with mixed anxiety and depressed mood [F43.23]   . Sepsis [A41.9] 08/22/2014  . Tobacco use disorder [Z72.0] 08/22/2014  . SIRS (systemic inflammatory response syndrome) [A41.9] 08/17/2014  . SOB (shortness of breath) [R06.02] 08/16/2014  . Cavitary pneumonia [J18.9, J98.4] 08/16/2014  . Cough [R05]   . Pyrexia [R50.9]    Total Time spent with patient: 20 minutes  Past Psychiatric History: This is a patient with long history of schizophrenia and substance  Past Medical History:  Past Medical History  Diagnosis Date  . Schizo affective schizophrenia    History reviewed. No pertinent past surgical history. Family History:  Family History  Problem Relation Age of Onset  . Cancer Father    Family Psychiatric  History: None reported. Social History:  History  Alcohol Use  . Yes    Comment: occasional     History  Drug Use  . Yes  . Special: Marijuana    Comment: "not in a while"    Social History   Social History  . Marital Status: Single    Spouse Name: N/A  . Number of Children: N/A  . Years of Education: N/A   Social History Main Topics  . Smoking status: Current Every Day Smoker -- 0.50 packs/day for 15 years    Types: Cigarettes  . Smokeless tobacco:  Never Used  . Alcohol Use: Yes     Comment: occasional  . Drug Use: Yes    Special: Marijuana     Comment: "not in a while"  . Sexual Activity: Not Asked   Other Topics Concern  . None   Social History Narrative   Additional Social History:                         Sleep: Good  Appetite:  Good  Current Medications: Current Facility-Administered Medications  Medication Dose Route Frequency Murrel Freet Last Rate Last Dose  . acetaminophen (TYLENOL) tablet 650 mg  650 mg Oral Q6H PRN Gonzella Lex, MD      . alum & mag hydroxide-simeth (MAALOX/MYLANTA) 200-200-20 MG/5ML suspension 30 mL  30 mL Oral Q4H PRN Gonzella Lex, MD      . magnesium hydroxide (MILK OF MAGNESIA) suspension 30 mL  30 mL Oral Daily PRN Gonzella Lex, MD      . OLANZapine (ZYPREXA) tablet 10 mg  10 mg Oral BID Clovis Fredrickson, MD   10 mg at 06/01/15 8916    Lab Results:  Results for orders placed or performed during the hospital encounter of 05/31/15 (from the past 48 hour(s))  Hemoglobin A1c     Status: None   Collection Time: 05/31/15  7:20 AM  Result Value Ref Range   Hgb A1c MFr Bld 4.9 4.0 - 6.0 %  Lipid panel, fasting     Status:  Abnormal   Collection Time: 05/31/15  7:20 AM  Result Value Ref Range   Cholesterol 167 0 - 200 mg/dL   Triglycerides 68 <150 mg/dL   HDL 40 (L) >40 mg/dL   Total CHOL/HDL Ratio 4.2 RATIO   VLDL 14 0 - 40 mg/dL   LDL Cholesterol 113 (H) 0 - 99 mg/dL    Comment:        Total Cholesterol/HDL:CHD Risk Coronary Heart Disease Risk Table                     Men   Women  1/2 Average Risk   3.4   3.3  Average Risk       5.0   4.4  2 X Average Risk   9.6   7.1  3 X Average Risk  23.4   11.0        Use the calculated Patient Ratio above and the CHD Risk Table to determine the patient's CHD Risk.        ATP III CLASSIFICATION (LDL):  <100     mg/dL   Optimal  100-129  mg/dL   Near or Above                    Optimal  130-159  mg/dL   Borderline  160-189   mg/dL   High  >190     mg/dL   Very High   TSH     Status: None   Collection Time: 05/31/15  7:20 AM  Result Value Ref Range   TSH 0.611 0.350 - 4.500 uIU/mL    Physical Findings: AIMS:  , ,  ,  ,    CIWA:    COWS:     Musculoskeletal: Strength & Muscle Tone: within normal limits Gait & Station: normal Patient leans: N/A  Psychiatric Specialty Exam: Review of Systems  All other systems reviewed and are negative.   Blood pressure 129/87, pulse 55, temperature 98.8 F (37.1 C), temperature source Oral, resp. rate 20, height 5\' 4"  (1.626 m), weight 60.328 kg (133 lb), SpO2 99 %.Body mass index is 22.82 kg/(m^2).  General Appearance: Casual  Eye Contact::  Good  Speech:  Normal Rate  Volume:  Normal  Mood:  Euthymic  Affect:  Appropriate  Thought Process:  Goal Directed  Orientation:  Full (Time, Place, and Person)  Thought Content:  Hallucinations: Command:  Telling him that people are out to hurt him  Suicidal Thoughts:  No  Homicidal Thoughts:  No  Memory:  Immediate;   Fair Recent;   Fair Remote;   Fair  Judgement:  Fair  Insight:  Fair  Psychomotor Activity:  Normal  Concentration:  Fair  Recall:  [employer] [place]  Language: Fair  Akathisia:  No  Handed:  Right  AIMS (if indicated):     Assets:  Communication Skills Desire for Improvement Financial Resources/Insurance Housing Physical Health Resilience Social Support  ADL's:  Intact  Cognition: WNL  Sleep:  Number of Hours: 7   Treatment Plan Summary: Daily contact with patient to assess and evaluate symptoms and progress in treatment and Medication management   Mr. Manninen is a 34 year old male with a history of schizophrenia and substance admitting for psychotic agitation and suicidal ideation in the context of substance use.  1. Suicidal ideation. The patient is able to contract for safety in the hospital.  2. Psychosis. He has been maintained on monthly injections of Abilify  Maintena.  He tells me that his last injection was not long ago and he is not due for the next one yet. He was given Haldol for agitation in the emergency room but does not believe that it's been helpful. He complains of loud and disturbing auditory command hallucinations. We will start Zyprexa.  3. Substance abuse. The patient is a daily marijuana smoker and it does not bother him in the least. On Friday he ingested substances that made him agitated and psychotic. His drug screen is only positive for marijuana. The patient feels that he needs help with substance abuse. We will refer to [place].  4. Smoking. Nicotine patch is available.  5. Disposition. TBE. He will follow-up with his ACT team.    Jolanta Pucilowska 06/01/2015, 11:43 AM

## 2015-06-01 NOTE — Progress Notes (Signed)
D: Patient stated slept poor last night .Stated appetite is fair and energy level  Is low. Stated concentration is poor . Stated on Depression scale7 ,hopeless 7 and anxiety 0.( low 0-10 high) Denies suicidal  homicidal ideations  .  No auditory hallucinations  No pain concerns . Appropriate ADL'S. Interacting with peers and staff.  Voice of being positive and and taking his medication. Voice concerns around hallucinations A: Encourage patient participation with unit programming . Instruction  Given on  Medication , verbalize understanding. R: Voice no other concerns. Staff continue to monitor

## 2015-06-01 NOTE — Plan of Care (Signed)
Problem: [facility] Participation in Recreation Therapeutic Interventions Goal: STG-Patient will demonstrate improved self esteem by identif STG: Self-Esteem - Within 3 treatment sessions, patient will verbalize at least 5 positive affirmation statements in one treatment session to increase self-esteem post d/c.  Outcome: Completed/Met Date Met:  06/01/15 Treatment Session 1; Completed 1 out of 1: At approximately 11:55 am, LRT met with patient in craft room. Patient verbalized 5 positive affirmation statements. Patient reported it felt "good". LRT encouraged patient to continue saying positive affirmation statements.  Leonette Monarch, LRT/CTRS 09.28.16 1:26 pm Goal: STG-Patient will identify at least five coping skills for ** STG: Coping Skills - Within 3 treatment sessions, patient will verbalize at least 5 coping skills for substance abuse in one treatment session to decrease substance abuse post d/c.  Outcome: Completed/Met Date Met:  06/01/15 Treatment Session 1; Completed 1 out of 1: At approximately 11:55 am, LRT met with patient in craft room. Patient verbalized 5 coping skills for substance abuse. LRT educated patient on leisure and why it is important to implement it into his schedule. LRT provided patient with blank schedules to help him plan his day and try to avoid using substances. LRT educated patient on healthy support systems.  Leonette Monarch, LRT/CTRS 09.28.16 1:28 pm Goal: STG-Other Recreation Therapy Goal (Specify) STG: Stress Management - Within 3 treatment sessions, patient will verbalize understanding of the stress management techniques in one treatment session to increase stress management skills post d/c.  Outcome: Completed/Met Date Met:  06/01/15 Treatment Session 1; Completed 1 out of 1: At approximately 11:55 am, LRT met with patient in craft room. LRT educated and provided patient with handouts on stress management techniques. Patient verbalized understanding. LRT  encouraged patient to read over and practice the stress management techniques.  Leonette Monarch, LRT/CTRS 09.28.16 1:29 pm

## 2015-06-01 NOTE — Progress Notes (Signed)
D: Pt denies SI/HI/AVH, Pt is alert and oriented x 4, denies pain,affect is blunted, and mood is tense and irritable, he appears less anxious, but he is not interacting with peers and staff appropriately.  A: Pt was offered support and encouragement. Pt was given scheduled medications. Pt was encouraged to attend groups. Q 15 minute checks were done for safety.  R:Pt did not attend evening group.  Pt is complaint with medication. Pt receptive to treatment and safety maintained on unit.

## 2015-06-01 NOTE — BHH Group Notes (Signed)
[place] Group Notes:  (Nursing/MHT/Case Management/Adjunct)  Date:  06/01/2015  Time:  12:42 PM  Type of Therapy:  Psychoeducational Skills  Participation Level:  Minimal  Participation Quality:  Inattentive  Affect:  Flat  Cognitive:  Appropriate  Insight:  Improving  Engagement in Group:  Poor  Modes of Intervention:  Clarification  Summary of Progress/Problems:  Celso Amy 06/01/2015, 12:42 PM

## 2015-06-01 NOTE — Progress Notes (Signed)
Recreation Therapy Notes  Date: 09.28.16 Time: 3:00 pm Location: Day Room  Group Topic: Self-esteem  Goal Area(s) Addresses:  Patient will write at least one positive trait. Patient will verbalize benefit of having a good self-esteem.  Behavioral Response: Attentive, Interactive  Intervention: I Am  Activity: Patients were given a worksheet with the letter I on it and instructed to fill the letter with positive traits about themselves.  Education:LRT educated patients on ways they can increase their self-esteem.  Education Outcome: Acknowledges education/In group clarification offered   Clinical Observations/Feedback: Patient completed activity by writing positive traits down. Patient contributed to group discussion by stating it was difficult to think of positive traits, how it felt to see positive traits written out, and how to increase his self-esteem.  Leonette Monarch, LRT/CTRS 06/01/2015 4:37 PM

## 2015-06-01 NOTE — Plan of Care (Signed)
Problem: Alteration in mood & ability to function due to Goal: LTG-Pt reports reduction in suicidal thoughts (Patient reports reduction in suicidal thoughts and is able to verbalize a safety plan for whenever patient is feeling suicidal)  Outcome: Progressing Patient denies SI/HI.  Goal: LTG-Pt verbalizes understanding of importance of med regimen (Patient verbalizes understanding of importance of medication regimen and need to continue outpatient care and support groups)  Patient compliant with medication.

## 2015-06-01 NOTE — Progress Notes (Signed)
Recreation Therapy Notes  INPATIENT RECREATION TR PLAN  Patient Details Name: Jeffrey Coleman MRN: [id number] DOB: Jan 13, 1981 Today's Date: 06/01/2015  Rec Therapy Plan Is patient appropriate for Therapeutic Recreation?: Yes Treatment times per week: At least once a week TR Treatment/Interventions: 1:1 session, Group participation (Comment) (Appropriate participation in daily recreation therapy tx)  Discharge Criteria Pt will be discharged from therapy if:: Treatment goals are met Treatment plan/goals/alternatives discussed and agreed upon by:: Patient/family  Discharge Summary Short term goals set: See Care Plan Short term goals met: Complete Progress toward goals comments: One-to-one attended Which groups?: Self-esteem, Goal setting One-to-one attended: Self-esteem, stress management, coping skills Reason goals not met: N/A Therapeutic equipment acquired: None Reason patient discharged from therapy: Treatment goals met Pt/family agrees with progress & goals achieved: Yes Date patient discharged from therapy: 06/01/15   Leonette Monarch, LRT/CTRS 06/01/2015, 4:59 PM

## 2015-06-01 NOTE — Plan of Care (Signed)
Problem: Ineffective individual coping Goal: LTG: Patient will report a decrease in negative feelings Outcome: Progressing Denies any negative feeling

## 2015-06-01 NOTE — Progress Notes (Signed)
Pt is visible in milieu this evening, AOx3, denies any SI/HI, endorses visual hallucination of seeing a shadow of a gun pointing to his head and hearing a voice that is telling him will kill him. Pt rated his depression 3/10, but said feeling much better. No further concerns voiced, compliant with meds, safety maintained.
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
## 2015-06-02 NOTE — BHH Group Notes (Signed)
[place] [facility] Group Notes:  (Nursing/MHT/Case Management/Adjunct)  Date:  06/02/2015  Time:  9:15 PM  Type of Therapy:  Group Therapy  Participation Level:  Active  Participation Quality:  Appropriate  Affect:  Appropriate  Cognitive:  Appropriate  Insight:  Appropriate  Engagement in Group:  Engaged  Modes of Intervention:  Discussion  Summary of Progress/Problems:  Kandis Fantasia 06/02/2015, 9:15 PM

## 2015-06-02 NOTE — Clinical Social Work Note (Signed)
[facility] [phone] called after referral made yesterday 06/01/15 and resent today 06/02/15 and gave Josem Kaufmann #[id number]. CSW called [facility] [phone]  with Josem Kaufmann .

## 2015-06-02 NOTE — Plan of Care (Signed)
Problem: Diagnosis: Increased Risk For Suicide Attempt Goal: LTG-Patient Will Report Improved Mood and Deny Suicidal LTG (by discharge) Patient will report improved mood and deny suicidal ideation.  Outcome: Progressing Denies suicidal ideation.     

## 2015-06-02 NOTE — Progress Notes (Signed)
[facility] MD Progress Note  06/02/2015 11:42 AM Jeffrey Coleman  MRN:  [id number]  Subjective:  Jeffrey Coleman feels much better today. Hallucinations have resolved completely his mood is good with anxious affect. We are not certain if the patient was accepted residential substance abuse treatment program which he desires. There are no somatic complaints. He tolerates medications well. There is some group participation.  Principal Problem: Schizophrenia, paranoid Diagnosis:   Patient Active Problem List   Diagnosis Date Noted  . Schizophrenia, paranoid [F20.0] 05/31/2015  . Cannabis use disorder, severe, dependence [F12.20] 05/30/2015  . Adjustment disorder with mixed anxiety and depressed mood [F43.23]   . Sepsis [A41.9] 08/22/2014  . Tobacco use disorder [Z72.0] 08/22/2014  . SIRS (systemic inflammatory response syndrome) [A41.9] 08/17/2014  . SOB (shortness of breath) [R06.02] 08/16/2014  . Cavitary pneumonia [J18.9, J98.4] 08/16/2014  . Cough [R05]   . Pyrexia [R50.9]    Total Time spent with patient: 20 minutes  Past Psychiatric History: There is a history of schizophrenia.  Past Medical History:  Past Medical History  Diagnosis Date  . Schizo affective schizophrenia    History reviewed. No pertinent past surgical history. Family History:  Family History  Problem Relation Age of Onset  . Cancer Father    Family Psychiatric  History: None reported. Social History:  History  Alcohol Use  . Yes    Comment: occasional     History  Drug Use  . Yes  . Special: Marijuana    Comment: "not in a while"    Social History   Social History  . Marital Status: Single    Spouse Name: N/A  . Number of Children: N/A  . Years of Education: N/A   Social History Main Topics  . Smoking status: Current Every Day Smoker -- 0.50 packs/day for 15 years    Types: Cigarettes  . Smokeless tobacco: Never Used  . Alcohol Use: Yes     Comment: occasional  . Drug Use: Yes    Special:  Marijuana     Comment: "not in a while"  . Sexual Activity: Not Asked   Other Topics Concern  . None   Social History Narrative   Additional Social History:                         Sleep: Good  Appetite:  Good  Current Medications: Current Facility-Administered Medications  Medication Dose Route Frequency Provider Last Rate Last Dose  . acetaminophen (TYLENOL) tablet 650 mg  650 mg Oral Q6H PRN Gonzella Lex, MD      . alum & mag hydroxide-simeth (MAALOX/MYLANTA) 200-200-20 MG/5ML suspension 30 mL  30 mL Oral Q4H PRN Gonzella Lex, MD      . magnesium hydroxide (MILK OF MAGNESIA) suspension 30 mL  30 mL Oral Daily PRN Gonzella Lex, MD      . OLANZapine (ZYPREXA) tablet 10 mg  10 mg Oral BID Clovis Fredrickson, MD   10 mg at 06/02/15 5035    Lab Results: No results found for this or any previous visit (from the past 28 hour(s)).  Physical Findings: AIMS:  , ,  ,  ,    CIWA:    COWS:     Musculoskeletal: Strength & Muscle Tone: within normal limits Gait & Station: normal Patient leans: N/A  Psychiatric Specialty Exam: Review of Systems  All other systems reviewed and are negative.   Blood pressure 127/84, pulse 54,  temperature 98.6 F (37 C), temperature source Oral, resp. rate 20, height 5\' 4"  (1.626 m), weight 60.328 kg (133 lb), SpO2 99 %.Body mass index is 22.82 kg/(m^2).  General Appearance: Casual  Eye Contact::  Minimal  Speech:  Normal Rate  Volume:  Normal  Mood:  Anxious  Affect:  Appropriate  Thought Process:  Goal Directed  Orientation:  Full (Time, Place, and Person)  Thought Content:  WDL  Suicidal Thoughts:  No  Homicidal Thoughts:  No  Memory:  Immediate;   Fair Recent;   Fair Remote;   Fair  Judgement:  Fair  Insight:  Shallow  Psychomotor Activity:  Decreased  Concentration:  Fair  Recall:  [employer] of Knowledge:Fair  Language: Fair  Akathisia:  No  Handed:  Right  AIMS (if indicated):     Assets:  Communication  Skills Desire for Improvement Financial Resources/Insurance Housing Physical Health Resilience Social Support  ADL's:  Intact  Cognition: WNL  Sleep:  Number of Hours: 6.45   Treatment Plan Summary: Daily contact with patient to assess and evaluate symptoms and progress in treatment and Medication management   Jeffrey Coleman is a 34 year old male with a history of schizophrenia and substance admitting for psychotic agitation and suicidal ideation in the context of substance use.  1. Suicidal ideation. The patient is able to contract for safety in the hospital.  2. Psychosis. He has been maintained on monthly injections of Abilify Maintena. He tells me that his last injection was not long ago and he is not due for the next one yet. He was given Haldol for agitation in the emergency room but does not believe that it's been helpful. He complains of loud and disturbing auditory command hallucinations. We will start Zyprexa.  3. Substance abuse. The patient is a daily marijuana smoker and it does not bother him in the least. On Friday he ingested substances that made him agitated and psychotic. His drug screen is only positive for marijuana. The patient feels that he needs help with substance abuse. We will refer to Jeffrey Coleman.  4. Smoking. Nicotine patch is available.  5. Disposition. TBE. He will follow-up with his ACT team.    Jeffrey Coleman 06/02/2015, 11:42 AM

## 2015-06-02 NOTE — Progress Notes (Signed)
He denies depression & suicidal ideation.Stated that hallucination is much better.Appropriate with staff & peers.Attended groups.Compliant with medications.

## 2015-06-02 NOTE — BHH Group Notes (Signed)
[facility] Group Notes:  (Nursing/MHT/Case Management/Adjunct)  Date:  06/02/2015  Time:  1:27 PM  Type of Therapy:  Psychoeducational Skills  Participation Level:  Active  Participation Quality:  Appropriate  Affect:  Appropriate  Cognitive:  Appropriate  Insight:  Appropriate and Good  Engagement in Group:  Engaged  Modes of Intervention:  Discussion, Education and Support  Summary of Progress/Problems:  Lorane Gell 06/02/2015, 1:27 PM

## 2015-06-02 NOTE — BHH Group Notes (Signed)
[facility] LCSW Group Therapy  06/02/2015 3:27 PM  Type of Therapy:  Group Therapy  Participation Level:  Active  Participation Quality:  Appropriate and Attentive  Affect:  Appropriate  Cognitive:  Alert, Appropriate and Oriented  Insight:  Engaged  Engagement in Therapy:  Engaged  Modes of Intervention:  Socialization and Support  Summary of Progress/Problems: Patient participated in group discussion appropriately. Patient shared that he has a brother that he does not always agree with and struggles with getting a job with legal issues and gets frustrated due to life circumstances at times. Patient reports he has a good support system in his family and that they are close.   [name], MSW, LCSWA 06/02/2015, 3:27 PM

## 2015-06-03 NOTE — BHH Suicide Risk Assessment (Signed)
[facility] INPATIENT:  Family/Significant Other Suicide Prevention Education  Suicide Prevention Education:  Education Completed; Husain Costabile (mother) [phone] has been identified by the patient as the family member/significant other with whom the patient will be residing, and identified as the person(s) who will aid the patient in the event of a mental health crisis (suicidal ideations/suicide attempt).  With written consent from the patient, the family member/significant other has been provided the following suicide prevention education, prior to the and/or following the discharge of the patient.  The suicide prevention education provided includes the following:  Suicide risk factors  Suicide prevention and interventions  National Suicide Hotline telephone number  [facility] assessment telephone number  [facility] Emergency Assistance [place] and/or Residential Mobile Crisis Unit telephone number  Request made of family/significant other to:  Remove weapons (e.g., guns, rifles, knives), all items previously/currently identified as safety concern.    Remove drugs/medications (over-the-counter, prescriptions, illicit drugs), all items previously/currently identified as a safety concern.  The family member/significant other verbalizes understanding of the suicide prevention education information provided.  The family member/significant other agrees to remove the items of safety concern listed above.  [name], MSW, LCSWA 06/03/2015, 2:06 PM

## 2015-06-03 NOTE — Plan of Care (Signed)
Problem: Ineffective individual coping Goal: LTG: Patient will report a decrease in negative feelings Outcome: Progressing Patient reports feeling better than upon arrival.  Goal: STG: Patient will remain free from self harm Outcome: Progressing No self harm.  Goal: STG:Pt. will utilize relaxation techniques to reduce stress STG: Patient will utilize relaxation techniques to reduce stress levels  Outcome: Progressing Patient interactive, walking, and open to communication.

## 2015-06-03 NOTE — BHH Suicide Risk Assessment (Signed)
[facility] Discharge Suicide Risk Assessment   Demographic Factors:  Male  Total Time spent with patient: 30 minutes  Musculoskeletal: Strength & Muscle Tone: within normal limits Gait & Station: normal Patient leans: N/A  Psychiatric Specialty Exam: Physical Exam  Nursing note and vitals reviewed.   Review of Systems  All other systems reviewed and are negative.   Blood pressure 117/79, pulse 91, temperature 98.7 F (37.1 C), temperature source Oral, resp. rate 20, height 5\' 4"  (1.626 m), weight 60.328 kg (133 lb), SpO2 99 %.Body mass index is 22.82 kg/(m^2).  General Appearance: Casual  Eye Contact::  Good  Speech:  Clear and [id number]  Volume:  Normal  Mood:  Euthymic  Affect:  Appropriate  Thought Process:  Goal Directed  Orientation:  Full (Time, Place, and Person)  Thought Content:  WDL  Suicidal Thoughts:  No  Homicidal Thoughts:  No  Memory:  Immediate;   Fair Recent;   Fair Remote;   Fair  Judgement:  Fair  Insight:  Fair  Psychomotor Activity:  Normal  Concentration:  Fair  Recall:  [employer] of [place]  Language: Fair  Akathisia:  No  Handed:  Right  AIMS (if indicated):     Assets:  Communication Skills Desire for Improvement Financial Resources/Insurance Housing Physical Health Resilience Social Support  Sleep:  Number of Hours: 6  Cognition: WNL  ADL's:  Intact   Have you used any form of tobacco in the last 30 days? (Cigarettes, Smokeless Tobacco, Cigars, and/or Pipes): Yes  Has this patient used any form of tobacco in the last 30 days? (Cigarettes, Smokeless Tobacco, Cigars, and/or Pipes) Yes, A prescription for an FDA-approved tobacco cessation medication was offered at discharge and the patient refused  Mental Status Per Nursing Assessment::   On Admission:     Current Mental Status by Physician: NA  Loss Factors: NA  Historical Factors: Impulsivity  Risk Reduction Factors:   Sense of responsibility to family, Living with  another person, especially a relative, Positive social support and Positive therapeutic relationship  Continued Clinical Symptoms:  Alcohol/Substance Abuse/Dependencies Schizophrenia:   Depressive state Less than 60 years old Paranoid or undifferentiated type  Cognitive Features That Contribute To Risk:  None    Suicide Risk:  Minimal: No identifiable suicidal ideation.  Patients presenting with no risk factors but with morbid ruminations; may be classified as minimal risk based on the severity of the depressive symptoms  Principal Problem: Schizophrenia, paranoid Discharge Diagnoses:  Patient Active Problem List   Diagnosis Date Noted  . Schizophrenia, paranoid [F20.0] 05/31/2015  . Cannabis use disorder, severe, dependence [F12.20] 05/30/2015  . Adjustment disorder with mixed anxiety and depressed mood [F43.23]   . Sepsis [A41.9] 08/22/2014  . Tobacco use disorder [Z72.0] 08/22/2014  . SIRS (systemic inflammatory response syndrome) [A41.9] 08/17/2014  . SOB (shortness of breath) [R06.02] 08/16/2014  . Cavitary pneumonia [J18.9, J98.4] 08/16/2014  . Cough [R05]   . Pyrexia [R50.9]       Plan Of Care/Follow-up recommendations:  Activity:  as tolerated. Diet:  low sodium heart healthy. Other:  keep follow-up appointments.  Is patient on multiple antipsychotic therapies at discharge:  No   Has Patient had three or more failed trials of antipsychotic monotherapy by history:  No  Recommended Plan for Multiple Antipsychotic Therapies: NA    Christiano Blandon 06/03/2015, 1:35 PM

## 2015-06-03 NOTE — Tx Team (Signed)
Interdisciplinary Treatment Plan Update (Adult)  Date:  06/03/2015 Time Reviewed:  2:08 PM  Progress in Treatment: Attending groups: Yes. Participating in groups:  Yes. Taking medication as prescribed:  Yes. Tolerating medication:  Yes. Family/Significant othe contact made:  Yes, individual(s) contacted:  patient's mother Alastor Kneale [phone] Patient understands diagnosis:  Yes. Discussing patient identified problems/goals with staff:  Yes. Medical problems stabilized or resolved:  Yes. Denies suicidal/homicidal ideation: Yes. Issues/concerns per patient self-inventory:  No. Other:  New problem(s) identified: No, Describe:  none reported  Discharge Plan or Barriers: Patient will discharge to [facility] if bed is available. If no bed available at [facility], patient may discharge home with his mother and followup at [facility] in [place].  Reason for Continuation of Hospitalization: Depression Suicidal ideation  Comments:  Estimated length of stay:up to 4 days expected discharge 06/06/15 Monday  New goal(s):  Review of initial/current patient goals per problem list:   1.  Goal(s): decrease depression  Met:  No  Target date: 06/06/15  2.  Goal (s): eliminate SI  Met:  Yes  Target date:06/06/15  As evidenced by: patient's report of no SI  3.  Goal(s):[facility] for substance abuse txt  Met:  No  Target date: 06/06/15   Attendees: Physician:  Orson Slick, MD 9/30/20162:08 PM  Nursing:   Elige Radon, RN 9/30/20162:08 PM  Other:  Carmell Austria, [place] 9/30/20162:08 PM  Other:   9/30/20162:08 PM  Other:   9/30/20162:08 PM  Other:  9/30/20162:08 PM  Other:  9/30/20162:08 PM  Other:  9/30/20162:08 PM  Other:  9/30/20162:08 PM  Other:  9/30/20162:08 PM  Other:  9/30/20162:08 PM  Other:   9/30/20162:08 PM   Scribe for Treatment Team:   [name], MSW, [place]  06/03/2015, 2:08 PM

## 2015-06-03 NOTE — Progress Notes (Signed)
D: pt aware of discharge this shift, pt denies suicidal ideation or homicidal ideation, pt calm and cooperative, no distress noted  A: all personal items in locker returned to patient, instructions given on discharge information, received prescriptions and instructed patient to call ACT team for a follow up appointment.   R: patient states he will comply with outpatient services and medications as prescribed.  Patient left with mother.

## 2015-06-03 NOTE — BHH Group Notes (Signed)
[place] Group Notes:  (Nursing/MHT/Case Management/Adjunct)  Date:  06/03/2015  Time:  12:02 PM  Type of Therapy:  Psychoeducational Skills  Participation Level:  Minimal  Participation Quality:  Appropriate  Affect:  Appropriate  Cognitive:  Appropriate  Insight:  Good  Engagement in Group:  Engaged  Modes of Intervention:  Discussion and Education  Summary of Progress/Problems:  Jeffrey Coleman 06/03/2015, 12:02 PM

## 2015-06-03 NOTE — Discharge Summary (Signed)
Physician Discharge Summary Note  Patient:  Jeffrey Coleman is an 34 y.o., male MRN:  [id number] DOB:  [birth year] Patient phone:  [phone] (home)  Patient address:   [place] [address],  Total Time spent with patient: 30 minutes  Date of Admission:  05/31/2015 Date of Discharge: 06/03/2015  Reason for Admission:  Psychotic break.  Identifying data. Jeffrey Coleman is a 34 year old male with a history of schizophrenia and substance use.  Chief complaint. "I have voices."  History of present illness. Information was obtained from the patient and the chart. Jeffrey Coleman has a long history of mental illness. For the past 6 months he has been maintained on Abilify Maintena injections. He reports much improvement with treatment. He was functioning well and did not experience auditory hallucinations at all. Last Friday, 5 days ago, he smoked a "roach" and became severely agitated and psychotic. He was disturbing people living in his neighborhood banging on the door. When he was brought to the emergency room he was agitated, hard to redirect, displaying unusual bizarre behavior and talking to himself. The patient is rather disturbed today by loud auditory hallucinations telling him that people are out to get him. He was (in his room saying "run, run, run". His behavior could be attributed to drug use but the patient recently lost his younger brother who was murdered. The perpetrator has not been apprehended yet. The patient worries about his safety as well. It is unclear whether he has reasons be warranted given his substance use. He denies symptoms of depression or anxiety. He denies symptoms suggestive of bipolar mania. She drinks on occasion but does not consider it a problem. He is a daily marijuana user. He has tried many different substances.  Past psychiatric history. According to our records he's been hospitalized at least 8 times for psychotic symptoms. He's been tried on  several medications but does not remember any names. He believes that Abilify works well for him most of the time. He attempted suicide by walking into traffic.  Family psychiatric history. 2 family members with schizophrenia.  Social history. He is disabled from mental illness and has Medicaid. He lives with family.  Principal Problem: Schizophrenia, paranoid Discharge Diagnoses: Patient Active Problem List   Diagnosis Date Noted  . Schizophrenia, paranoid [F20.0] 05/31/2015  . Cannabis use disorder, severe, dependence [F12.20] 05/30/2015  . Adjustment disorder with mixed anxiety and depressed mood [F43.23]   . Sepsis [A41.9] 08/22/2014  . Tobacco use disorder [Z72.0] 08/22/2014  . SIRS (systemic inflammatory response syndrome) [A41.9] 08/17/2014  . SOB (shortness of breath) [R06.02] 08/16/2014  . Cavitary pneumonia [J18.9, J98.4] 08/16/2014  . Cough [R05]   . Pyrexia [R50.9]     Musculoskeletal: Strength & Muscle Tone: within normal limits Gait & Station: normal Patient leans: N/A  Psychiatric Specialty Exam: Physical Exam  Nursing note and vitals reviewed.   Review of Systems  All other systems reviewed and are negative.   Blood pressure 117/79, pulse 91, temperature 98.7 F (37.1 C), temperature source Oral, resp. rate 20, height 5\' 4"  (1.626 m), weight 60.328 kg (133 lb), SpO2 99 %.Body mass index is 22.82 kg/(m^2).  See SRA.                                                  Sleep:  Number of  Hours: 6   Have you used any form of tobacco in the last 30 days? (Cigarettes, Smokeless Tobacco, Cigars, and/or Pipes): Yes  Has this patient used any form of tobacco in the last 30 days? (Cigarettes, Smokeless Tobacco, Cigars, and/or Pipes) Yes, A prescription for an FDA-approved tobacco cessation medication was offered at discharge and the patient refused  Past Medical History:  Past Medical History  Diagnosis Date  . Schizo affective schizophrenia     History reviewed. No pertinent past surgical history. Family History:  Family History  Problem Relation Age of Onset  . Cancer Father    Social History:  History  Alcohol Use  . Yes    Comment: occasional     History  Drug Use  . Yes  . Special: Marijuana    Comment: "not in a while"    Social History   Social History  . Marital Status: Single    Spouse Name: N/A  . Number of Children: N/A  . Years of Education: N/A   Social History Main Topics  . Smoking status: Current Every Day Smoker -- 0.50 packs/day for 15 years    Types: Cigarettes  . Smokeless tobacco: Never Used  . Alcohol Use: Yes     Comment: occasional  . Drug Use: Yes    Special: Marijuana     Comment: "not in a while"  . Sexual Activity: Not Asked   Other Topics Concern  . None   Social History Narrative    Past Psychiatric History: Hospitalizations:  Outpatient Care:  Substance Abuse Care:  Self-Mutilation:  Suicidal Attempts:  Violent Behaviors:   Risk to Self: Is patient at risk for suicide?: Yes Risk to Others:   Prior Inpatient Therapy:   Prior Outpatient Therapy:    Level of Care:  OP  Hospital Course:    Jeffrey Coleman is a 34 year old male with a history of schizophrenia and substance abuse admitted for psychotic agitation and suicidal ideation in the context of substance use.  1. Suicidal ideation. This has resolved.The patient is able to contract for safety.  2. Psychosis. He has been maintained on monthly injections of Abilify Maintena according to his psychiatrist [name] his symptoms are well controlled. He was given Zyprexa here to address psychotic symptoms. We discontinued Zyprexa. The patient will continue on Abilify Maintena in the community. Next injection in 3 weeks.  3. Substance abuse. The patient is a daily marijuana smoker. He also uses alcohol and cocaine. Prior to admission he ingested substances that made him agitated and psychotic. His drug screen was only  positive for marijuana. The patient and he is primary psychiatrist believe that the patient needs residential substance abuse treatment. We will refer this patient to [facility] but no beds are available.  4. Smoking. Nicotine patch was available.  5. Disposition. He was discharged to home with his family. He will follow-up with his ACT team.his act team will hopefully help him to get treatment at [facility].   Consults:  None  Significant Diagnostic Studies:  None  Discharge Vitals:   Blood pressure 117/79, pulse 91, temperature 98.7 F (37.1 C), temperature source Oral, resp. rate 20, height 5\' 4"  (1.626 m), weight 60.328 kg (133 lb), SpO2 99 %. Body mass index is 22.82 kg/(m^2). Lab Results:   No results found for this or any previous visit (from the past 72 hour(s)).  Physical Findings: AIMS:  , ,  ,  ,    CIWA:    COWS:  See Psychiatric Specialty Exam and Suicide Risk Assessment completed by Attending Physician prior to discharge.  Discharge destination:  Home  Is patient on multiple antipsychotic therapies at discharge:  No   Has Patient had three or more failed trials of antipsychotic monotherapy by history:  No    Recommended Plan for Multiple Antipsychotic Therapies: NA  Discharge Instructions    Diet - low sodium heart healthy    Complete by:  As directed      Increase activity slowly    Complete by:  As directed             Medication List    TAKE these medications      Indication   ARIPiprazole 400 MG Susr  Inject 400 mg into the muscle every 28 (twenty-eight) days. Next dose is due 10/07/2014   Indication:  Schizophrenia     benztropine 1 MG tablet  Commonly known as:  COGENTIN  Take 1 mg by mouth daily as needed for tremors.          Follow-up recommendations:  Activity:  as tolerated. Diet:  low sodium heart healthy. Other:  keep follow-up appointments.  Comments:    Total Discharge Time: 35 min.  Signed: Jolanta Pucilowska 06/03/2015,  1:41 PM

## 2015-06-03 NOTE — Progress Notes (Signed)
  [facility] Adult Case Management Discharge Plan :  Will you be returning to the same living situation after discharge:  Yes,  home with his mother At discharge, do you have transportation home?: Yes,  patient's mother will pick up at discharge Do you have the ability to pay for your medications: Yes,  patient has insurance  Release of information consent forms completed and in the chart;  Patient's signature needed at discharge.  Patient to Follow up at: Follow-up Information    Follow up with [facility]. Call on 06/03/2015.   Why:  For follow-up care appt patient will call ACT team to schedule visit at home either today 06/03/15 or Monday 06/06/15.    Contact information:   [address] [place], [place] Ph [phone] Fax [phone]       Patient denies SI/HI: Yes,  patient denies SI/HI    Safety Planning and Suicide Prevention discussed: Yes,  SPE discussed with patient and his mother Alferd Obryant [phone]  Have you used any form of tobacco in the last 30 days? (Cigarettes, Smokeless Tobacco, Cigars, and/or Pipes): Yes  Has patient been referred to the Quitline?: Patient refused referral  [name], MSW, LCSWA 06/03/2015, 2:07 PM

## 2015-06-03 NOTE — [facility] Group Notes (Signed)
[facility] LCSW Group Therapy  06/03/2015 12:21 PM  Type of Therapy:  Group Therapy  Participation Level:  Did Not Attend  Modes of Intervention:  Discussion, Education, Socialization and Support  Summary of Progress/Problems: Feelings around Relapse. Group members discussed the meaning of relapse and shared personal stories of relapse, how it affected them and others, and how they perceived themselves during this time. Group members were encouraged to identify triggers, warning signs and coping skills used when facing the possibility of relapse. Social supports were discussed and explored in detail.   [place] MSW, [place]  06/03/2015, 12:21 PM

## 2015-11-23 IMAGING — CR DG CHEST 2V
2 series · 2 of 2 positions shown · non-contrast
Comparison: August 11, 2014.

CLINICAL DATA: Cough, fever.

EXAM:
CHEST  2 VIEW

[view not recorded (1 of 2)]
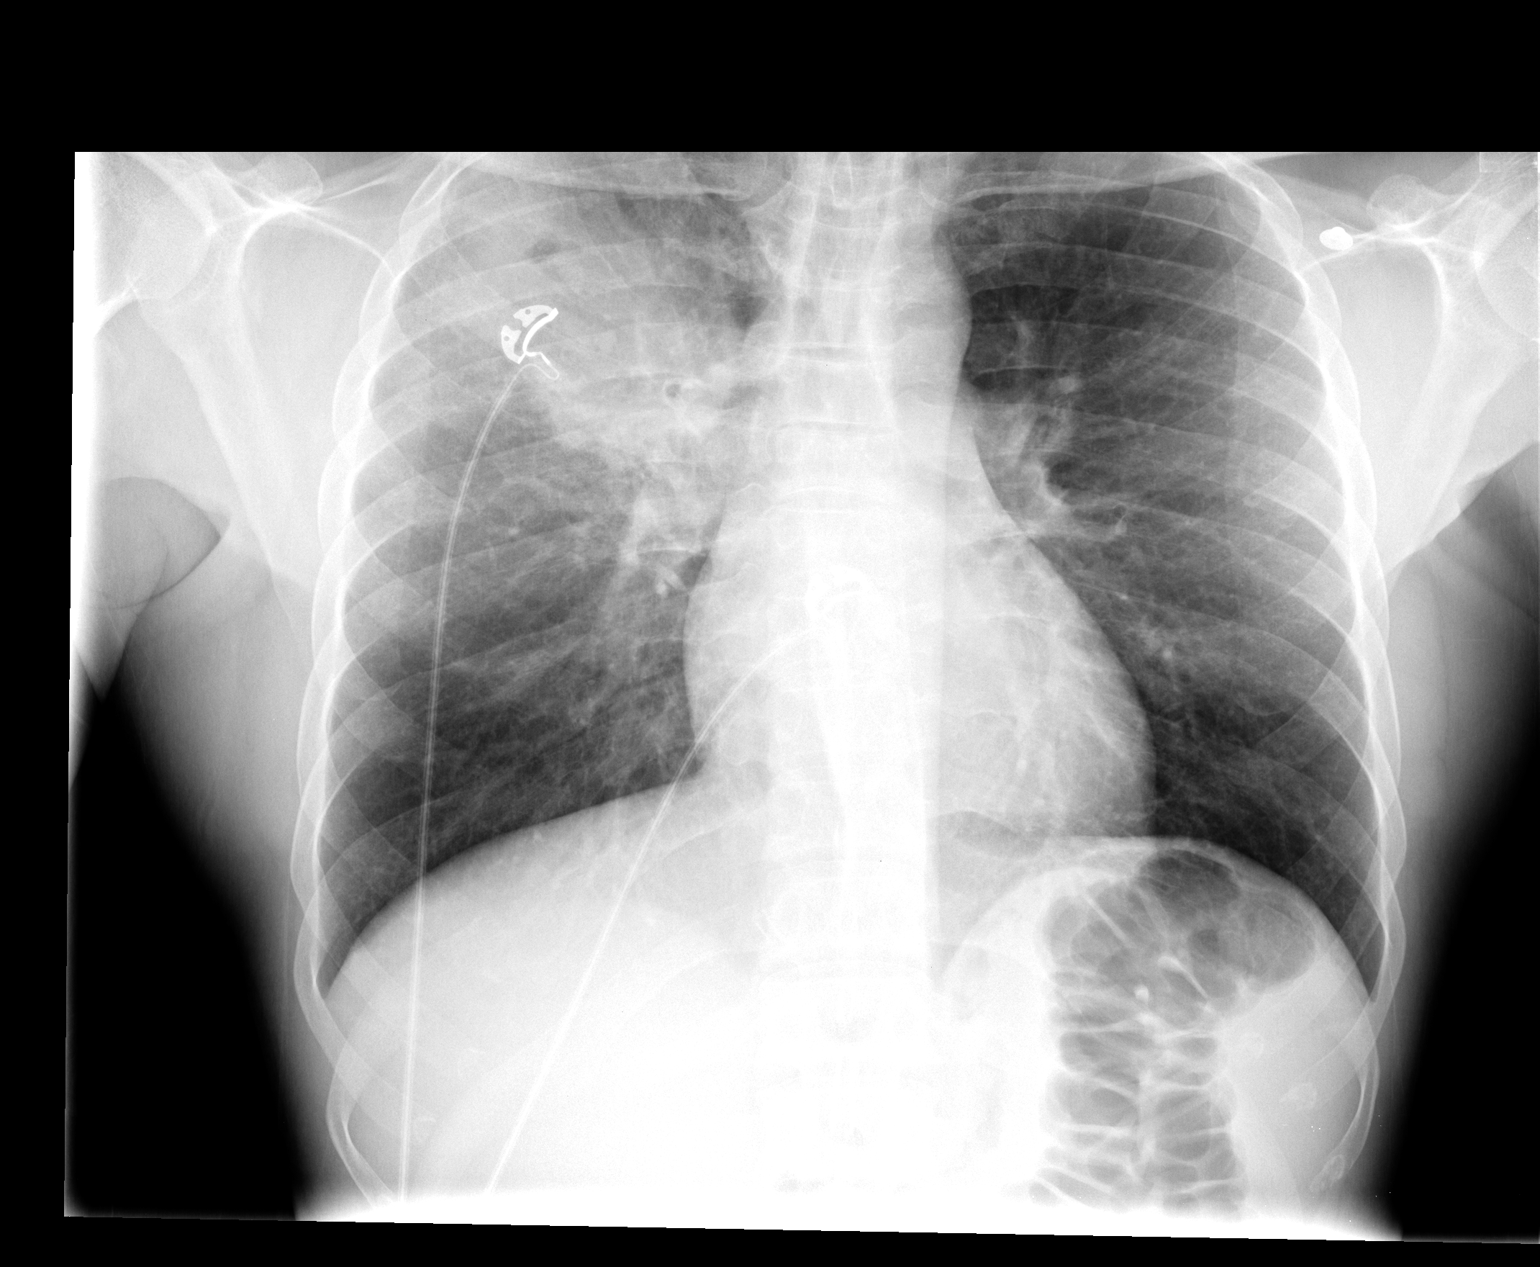

[view not recorded (2 of 2)]
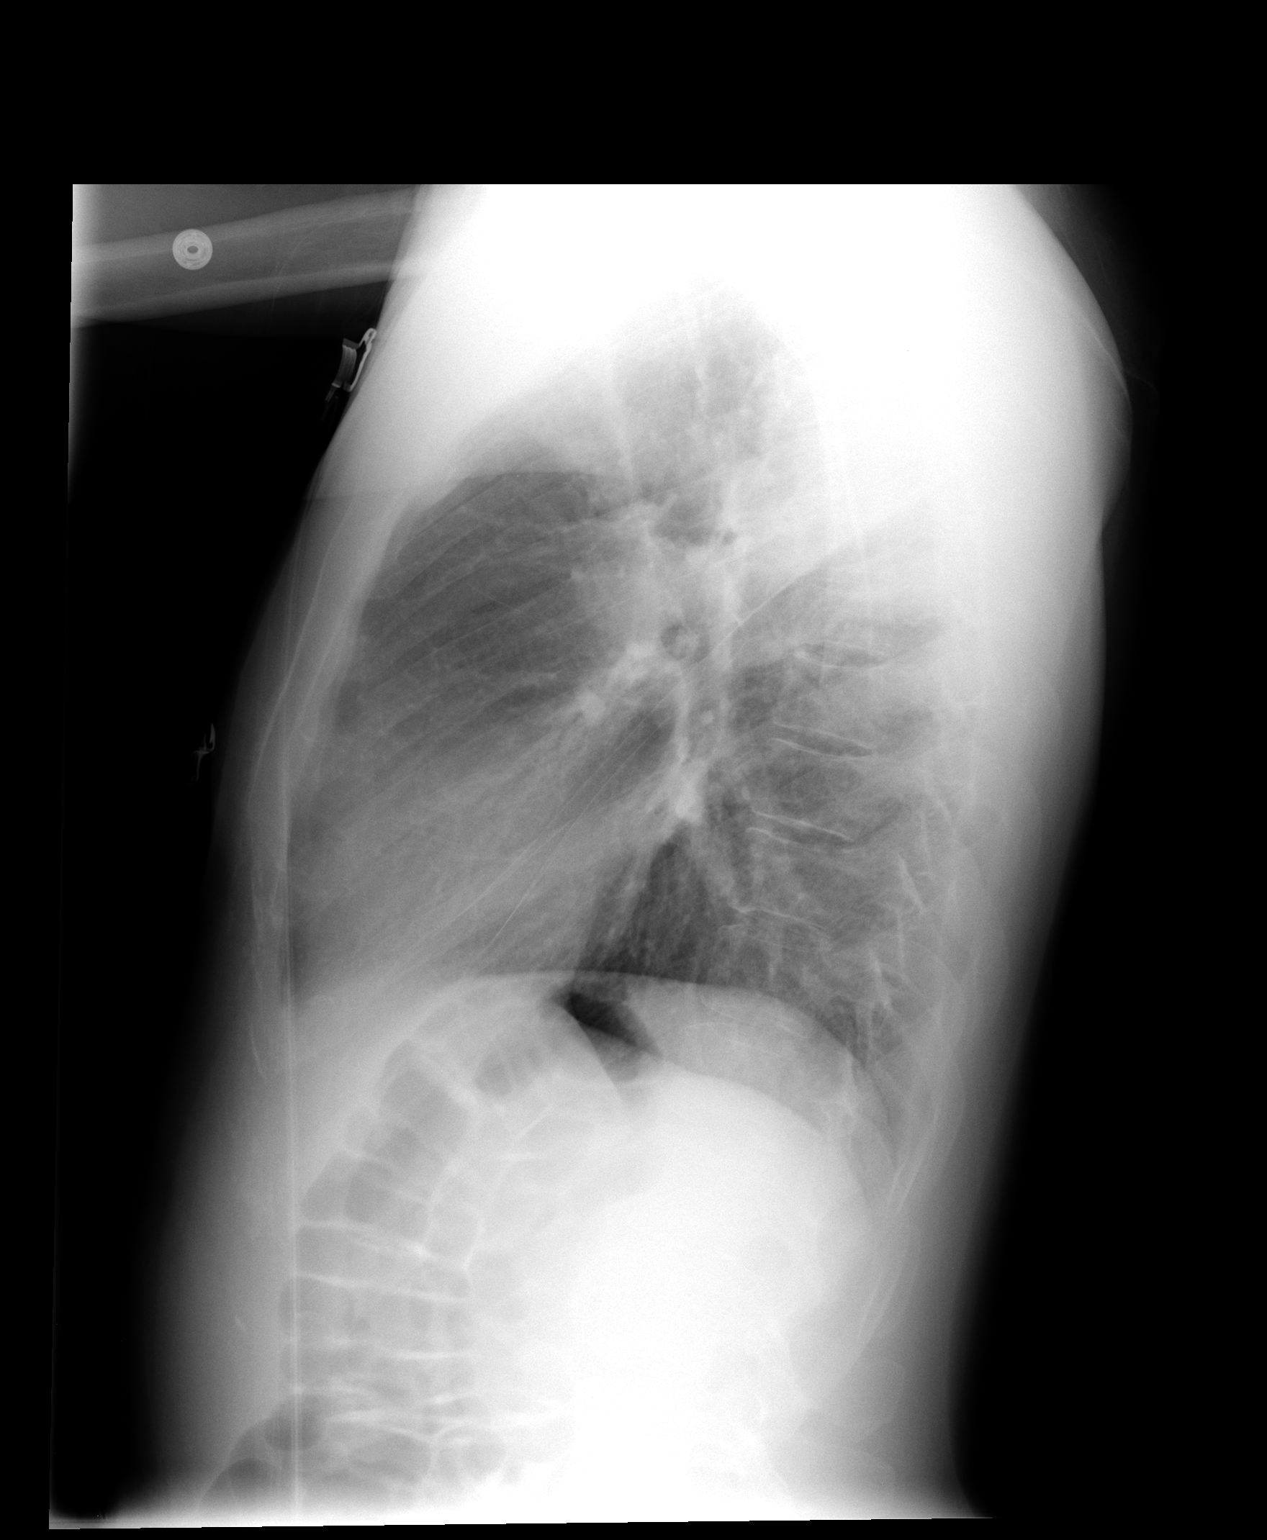

[2 of 2 positions shown; findings below may reference images not displayed]

FINDINGS: The heart size and mediastinal contours are within normal limits. No
pneumothorax or pleural effusion is noted. Left lung is clear. Right
upper lobe opacity is significantly increased consistent with
worsening pneumonia. The visualized skeletal structures are
unremarkable.
IMPRESSION: Worsening right upper lobe pneumonia. Followup radiographs are
recommended until resolution.

## 2015-12-04 ENCOUNTER — Emergency Department (HOSPITAL_COMMUNITY)
Admission: EM | Admit: 2015-12-04 | Discharge: 2015-12-05 | Payer: Medicare Other | Attending: Emergency Medicine | Admitting: Emergency Medicine

## 2015-12-04 ENCOUNTER — Encounter (HOSPITAL_COMMUNITY): Payer: Self-pay

## 2015-12-04 DIAGNOSIS — F17210 Nicotine dependence, cigarettes, uncomplicated: Secondary | ICD-10-CM | POA: Insufficient documentation

## 2015-12-04 DIAGNOSIS — Z79899 Other long term (current) drug therapy: Secondary | ICD-10-CM | POA: Insufficient documentation

## 2015-12-04 DIAGNOSIS — R45851 Suicidal ideations: Secondary | ICD-10-CM | POA: Insufficient documentation

## 2015-12-04 DIAGNOSIS — F1410 Cocaine abuse, uncomplicated: Secondary | ICD-10-CM

## 2015-12-04 LAB — CBC WITH DIFFERENTIAL/PLATELET
BASOS PCT: 1 %
Basophils Absolute: 0.1 10*3/uL (ref 0.0–0.1)
EOS ABS: 0.2 10*3/uL (ref 0.0–0.7)
Eosinophils Relative: 3 %
HEMATOCRIT: 41.7 % (ref 39.0–52.0)
Hemoglobin: 15 g/dL (ref 13.0–17.0)
Lymphocytes Relative: 43 %
Lymphs Abs: 3.9 10*3/uL (ref 0.7–4.0)
MCH: 30.9 pg (ref 26.0–34.0)
MCHC: 36 g/dL (ref 30.0–36.0)
MCV: 86 fL (ref 78.0–100.0)
MONO ABS: 0.5 10*3/uL (ref 0.1–1.0)
MONOS PCT: 6 %
Neutro Abs: 4.3 10*3/uL (ref 1.7–7.7)
Neutrophils Relative %: 47 %
PLATELETS: 246 10*3/uL (ref 150–400)
RBC: 4.85 MIL/uL (ref 4.22–5.81)
RDW: 14.1 % (ref 11.5–15.5)
WBC: 9 10*3/uL (ref 4.0–10.5)

## 2015-12-04 LAB — RAPID URINE DRUG SCREEN, HOSP PERFORMED
AMPHETAMINES: NOT DETECTED
BARBITURATES: NOT DETECTED
BENZODIAZEPINES: NOT DETECTED
COCAINE: POSITIVE — AB
Opiates: NOT DETECTED
Tetrahydrocannabinol: NOT DETECTED

## 2015-12-04 LAB — BASIC METABOLIC PANEL
ANION GAP: 5 (ref 5–15)
BUN: 11 mg/dL (ref 6–20)
CHLORIDE: 106 mmol/L (ref 101–111)
CO2: 28 mmol/L (ref 22–32)
Calcium: 8.8 mg/dL — ABNORMAL LOW (ref 8.9–10.3)
Creatinine, Ser: 1.29 mg/dL — ABNORMAL HIGH (ref 0.61–1.24)
GFR calc Af Amer: >60 mL/min (ref >60)
GLUCOSE: 48 mg/dL — AB (ref 65–99)
POTASSIUM: 4.3 mmol/L (ref 3.5–5.1)
Sodium: 139 mmol/L (ref 135–145)

## 2015-12-04 LAB — ETHANOL

## 2015-12-04 MED ORDER — BENZTROPINE MESYLATE 1 MG PO TABS: 1.0000 mg | ORAL_TABLET | Freq: Every day | ORAL | Status: DC | PRN

## 2015-12-04 MED ORDER — ARIPIPRAZOLE ER 400 MG IM SUSR
400.0000 mg | INTRAMUSCULAR | Status: DC
  Filled 2015-12-04: qty 400

## 2015-12-04 NOTE — ED Provider Notes (Signed)
CSN: [id number]     Arrival date & time 12/04/15  1238 History   First MD Initiated Contact with Patient 12/04/15 1246     Chief Complaint  Patient presents with  . V70.1     HPI Pt was seen at 1250. Per EMS and pt report, c/o gradual onset and worsening of persistent SI for the past 2 days. States he will "cut my wrists." States he has been "stressed out" over the recent death of a family member. Endorses compliance with his psych meds. Denies SA, no HI, no hallucinations.    Past Medical History  Diagnosis Date  . Schizo affective schizophrenia ([place])    History reviewed. No pertinent past surgical history.   Family History  Problem Relation Age of Onset  . Cancer Father    Social History  Substance Use Topics  . Smoking status: Current Every Day Smoker -- 0.50 packs/day for 15 years    Types: Cigarettes  . Smokeless tobacco: Never Used  . Alcohol Use: Yes     Comment: occasional    Review of Systems ROS: Statement: All systems negative except as marked or noted in the HPI; Constitutional: Negative for fever and chills. ; ; Eyes: Negative for eye pain, redness and discharge. ; ; ENMT: Negative for ear pain, hoarseness, nasal congestion, sinus pressure and sore throat. ; ; Cardiovascular: Negative for chest pain, palpitations, diaphoresis, dyspnea and peripheral edema. ; ; Respiratory: Negative for cough, wheezing and stridor. ; ; Gastrointestinal: Negative for nausea, vomiting, diarrhea, abdominal pain, blood in stool, hematemesis, jaundice and rectal bleeding. . ; ; Genitourinary: Negative for dysuria, flank pain and hematuria. ; ; Musculoskeletal: Negative for back pain and neck pain. Negative for swelling and trauma.; ; Skin: Negative for pruritus, rash, abrasions, blisters, bruising and skin lesion.; ; Neuro: Negative for headache, lightheadedness and neck stiffness. Negative for weakness, altered level of consciousness , altered mental status, extremity weakness, paresthesias,  involuntary movement, seizure and syncope.; Psych:  +SI. Denies SA, no HI, no hallucinations.    Allergies  Review of patient's allergies indicates no known allergies.  Home Medications   Prior to Admission medications   Medication Sig Start Date End Date Taking? Authorizing Provider  ARIPiprazole 400 MG SUSR Inject 400 mg into the muscle every 28 (twenty-eight) days. Next dose is due 10/07/2014 10/07/14   Shuvon B Rankin, NP  benztropine (COGENTIN) 1 MG tablet Take 1 mg by mouth daily as needed for tremors.    Historical Provider, MD   BP 109/66 mmHg  Pulse 57  Temp(Src) 98 F (36.7 C) (Oral)  Resp 16  Ht 5\' 4"  (1.626 m)  Wt 145 lb (65.772 kg)  BMI 24.88 kg/m2  SpO2 100% Physical Exam  1255: Physical examination:  Nursing notes reviewed; Vital signs and O2 SAT reviewed;  Constitutional: Well developed, Well nourished, Well hydrated, In no acute distress; Head:  Normocephalic, atraumatic; Eyes: EOMI, PERRL, No scleral icterus; ENMT: Mouth and pharynx normal, Mucous membranes moist; Neck: Supple, Full range of motion; Cardiovascular: Regular rate and rhythm; Respiratory: Breath sounds clear, No wheezes.  Speaking full sentences with ease, Normal respiratory effort/excursion; Chest: No deformity, Movement normal; Abdomen: Nondistended; Extremities: No deformity.; Neuro: AA&Ox3, Major CN grossly intact.  Speech clear. No gross focal motor deficits in extremities. Climbs on and off stretcher easily by himself. Gait steady.; Skin: Color normal, Warm, Dry.; Psych:  Affect flat. Endorses SI.   ED Course  Procedures (including critical care time) Labs Review  Imaging Review  I have personally reviewed and evaluated these images and lab results as part of my medical decision-making.   EKG Interpretation None      MDM  MDM Reviewed: previous chart, nursing note and vitals Reviewed previous: labs Interpretation: labs     Results for orders placed or performed during the hospital  encounter of 12/04/15  CBC with Differential  Result Value Ref Range   WBC 9.0 4.0 - 10.5 K/uL   RBC 4.85 4.22 - 5.81 MIL/uL   Hemoglobin 15.0 13.0 - 17.0 g/dL   HCT 41.7 39.0 - 52.0 %   MCV 86.0 78.0 - 100.0 fL   MCH 30.9 26.0 - 34.0 pg   MCHC 36.0 30.0 - 36.0 g/dL   RDW 14.1 11.5 - 15.5 %   Platelets 246 150 - 400 K/uL   Neutrophils Relative % 47 %   Neutro Abs 4.3 1.7 - 7.7 K/uL   Lymphocytes Relative 43 %   Lymphs Abs 3.9 0.7 - 4.0 K/uL   Monocytes Relative 6 %   Monocytes Absolute 0.5 0.1 - 1.0 K/uL   Eosinophils Relative 3 %   Eosinophils Absolute 0.2 0.0 - 0.7 K/uL   Basophils Relative 1 %   Basophils Absolute 0.1 0.0 - 0.1 K/uL  Basic metabolic panel  Result Value Ref Range   Sodium 139 135 - 145 mmol/L   Potassium 4.3 3.5 - 5.1 mmol/L   Chloride 106 101 - 111 mmol/L   CO2 28 22 - 32 mmol/L   Glucose, Bld 48 (L) 65 - 99 mg/dL   BUN 11 6 - 20 mg/dL   Creatinine, Ser 1.29 (H) 0.61 - 1.24 mg/dL   Calcium 8.8 (L) 8.9 - 10.3 mg/dL   GFR calc non Af Amer >60 >60 mL/min   GFR calc Af Amer >60 >60 mL/min   Anion gap 5 5 - 15  Ethanol  Result Value Ref Range   Alcohol, Ethyl (B) <5 <5 mg/dL  Urine rapid drug screen (hosp performed)  Result Value Ref Range   Opiates NONE DETECTED NONE DETECTED   Cocaine POSITIVE (A) NONE DETECTED   Benzodiazepines NONE DETECTED NONE DETECTED   Amphetamines NONE DETECTED NONE DETECTED   Tetrahydrocannabinol NONE DETECTED NONE DETECTED   Barbiturates NONE DETECTED NONE DETECTED    1525:  TTS has evaluated pt: recommends inpt treatment. Holding orders written.   Francine Graven, DO 12/04/15 2107

## 2015-12-04 NOTE — ED Notes (Signed)
Pt arrived via [facility].  Pt reports SI for the past 2 days.  Reports has been stressed out recently.  EMS says pt has history of schizophrenia and has been compliant with his medications and his treatment regimen.  Pt reports smoked crack Thursday.

## 2015-12-04 NOTE — BH Assessment (Signed)
Tele Assessment Note   Jeffrey Coleman is an African-American 35 y.o. male with a  Standing diagnosis of Schizoaffective Disorder (Depressed Type) who presented voluntarily to [facility] with a complaint of suicidal ideation (with plan and intent) and other depressive symptoms.  Pt lives with his mother Jeffrey Coleman -- [phone]) and receives ACT Team Services through [facility] [phone]).  He reported that over the last three days, he has become increasingly despondent as he approaches the year anniversary of his brother's murder and decided to kill himself by cutting his wrists with a box cutter.  Instead of doing so, Pt called EMS and asked to be transported to the hospital.  Pt has a history of suicidal ideation/attempts and also self-injury (cutting, burning, and recently rubbing the skin off his arm).  In addition to suicidal ideation with plan and intent, Pt endorsed the following depressive symptoms:  Persistent sadness, depressive preoccupation over brother's murder, guilt, fatigue, feelings of worthlessness and hopelessness, and insomnia.  He also endorsed recent use of crack cocaine (last use was 12/01/15) and marijuana (daily use).  Labs were not available at this writing.  Pt said that he does not feel safe, and that if he were discharged, he is not sure he would be safe.  Pt asked for inpatient placement.  During assessment, Pt had good eye contact and he was cooperative.  He was dressed in hospital scrubs.  Demeanor was attentive.  Pt endorsed suicidal ideation with plan and intent and other depressive symptoms.  Triggers include the pending anniversary of his brother's murder, the upcoming trial for his brother's murder, and drug use (last use of crack cocaine was 12/01/15).  Pt denied homicidal ideation.  Pt endorsed recent self-injury (rubbing the skin off his arm), and he also said that he has a history of cutting and burning himself.  Pt denied current hallucination, although he stated  that he has experienced auditory and visual hallucinations in the past ("That's under control now").  Thought processes were logical, coherent, and goal-oriented.  Thought content was unremarkable apart from depressive preoccupation about his brother.  Per previous history, Pt has experienced paranoid delusion in the past.  Pt's memory and concentration were intact.  Judgment and impulsivity were fair.  Insight was fair.    Author attempted to reach Pt's ACT Team, but there was no answer.    Author consulted with Earleen Newport, FNP, who determined that Pt met criteria for inpatient placement.  Diagnosis: Schizoaffective Disorder  Past Medical History:  Past Medical History  Diagnosis Date  . Schizo affective schizophrenia ([place])     History reviewed. No pertinent past surgical history.  Family History:  Family History  Problem Relation Age of Onset  . Cancer Father     Social History:  reports that he has been smoking Cigarettes.  He has a 7.5 pack-year smoking history. He has never used smokeless tobacco. He reports that he drinks alcohol. He reports that he uses illicit drugs.  Additional Social History:  Alcohol / Drug Use Pain Medications: See PTA Prescriptions: See PTA (Pt stated he is med-compliant) Over the Counter: See PTA History of alcohol / drug use?: Yes Substance #1 Name of Substance 1: Crack Cocaine 1 - Age of First Use: 19 1 - Amount (size/oz): Varies 1 - Frequency: Episodic 1 - Duration: Ongoing 1 - Last Use / Amount: 12/01/2015 Substance #3 Name of Substance 3: Marijuana 3 - Age of First Use: 15 3 - Amount (size/oz): Varies 3 -  Frequency: Daily 3 - Duration: Ongoing  CIWA: CIWA-Ar BP: 109/66 mmHg Pulse Rate: (!) 57 COWS:    PATIENT STRENGTHS: (choose at least two) Ability for insight Communication skills General fund of knowledge Supportive family/friends  Allergies: No Known Allergies  Home Medications:  (Not in a hospital admission)  OB/GYN  Status:  No LMP for male patient.  General Assessment Data [facility] of Assessment: [facility] Assessment: In system Is this a Tele or Face-to-Face Assessment?: Tele Assessment Is this an Initial Assessment or a Re-assessment for this encounter?: Initial Assessment Marital status: Single Is patient pregnant?: No Pregnancy Status: No Living Arrangements: Parent Can pt return to current living arrangement?: Yes Admission Status: Voluntary Is patient capable of signing voluntary admission?: Yes Referral Source: MD Insurance type: Story MCD  Medical Screening Exam ([place]) Medical Exam completed: Yes  Crisis Care Plan Living Arrangements: Parent Name of Psychiatrist: [name] -- Irineo Axon Name of Therapist: Irineo Axon  Education Status Is patient currently in school?: No  Risk to self with the past 6 months Suicidal Ideation: Yes-Currently Present Has patient been a risk to self within the past 6 months prior to admission? : Yes Suicidal Intent: Yes-Currently Present Has patient had any suicidal intent within the past 6 months prior to admission? : Yes Is patient at risk for suicide?: Yes Suicidal Plan?: Yes-Currently Present Has patient had any suicidal plan within the past 6 months prior to admission? : Yes Specify Current Suicidal Plan: Cut wrists with boxcutter Access to Means: No What has been your use of drugs/alcohol within the last 12 months?: Pt endorsed crack cocaine, marijuana, alcohol Previous Attempts/Gestures: Yes How many times?: 4 (Per previous report) Triggers for Past Attempts: Anniversary, Other (Comment) (Death of brother; drug use) Intentional Self Injurious Behavior: Cutting, Burning, Bruising ("I rubbed the skin off my arms") Comment - Self Injurious Behavior: Endorsed hx of cutting, burning; said rubbed skin off arm recently Family Suicide History: Unknown Recent stressful life event(s): Trauma (Comment), Other (Comment) (Upcoming  anniversary of brother's murder; drug use) Persecutory voices/beliefs?: No ("That's under control right now") Depression: Yes Depression Symptoms: Despondent, Insomnia, Isolating, Guilt, Feeling worthless/self pity Substance abuse history and/or treatment for substance abuse?: Yes Suicide prevention information given to non-admitted patients: Not applicable  Risk to Others within the past 6 months Homicidal Ideation: No Does patient have any lifetime risk of violence toward others beyond the six months prior to admission? : No Thoughts of Harm to Others: No Current Homicidal Intent: No Current Homicidal Plan: No Access to Homicidal Means: No History of harm to others?: No Assessment of Violence: None Noted Does patient have access to weapons?: No Criminal Charges Pending?: No Does patient have a court date: No Is patient on probation?: No  Psychosis Hallucinations: None noted ("That's under control right now") Delusions: None noted (Previous hx of paranoid delusion -- Sept 2016)  Mental Status Report Appearance/Hygiene: In scrubs, Unremarkable Eye Contact: Good Motor Activity: Unremarkable Speech: Logical/coherent Level of Consciousness: Alert Mood: Preoccupied, Depressed Affect: Blunted Anxiety Level: None Thought Processes: Coherent, Relevant Judgement: Impaired Orientation: Person, Place, Time, Situation Obsessive Compulsive Thoughts/Behaviors: None  Cognitive Functioning Concentration: Normal Memory: Remote Intact, Recent Intact IQ: Average Insight: Fair Impulse Control: Poor Appetite: Good Sleep: Decreased Vegetative Symptoms: None  ADLScreening [facility] Assessment Services) Patient's cognitive ability adequate to safely complete daily activities?: Yes Patient able to express need for assistance with ADLs?: Yes Independently performs ADLs?: Yes (appropriate for developmental age)  Prior Inpatient Therapy  Prior Inpatient Therapy: Yes Prior Therapy Dates:  November 2015; September 2016 Prior Therapy Facilty/Provider(s): [facility]; [facility] Reason for Treatment: Schizoaffective Disorder  Prior Outpatient Therapy Prior Outpatient Therapy: Yes Prior Therapy Dates: Ongoing Prior Therapy Facilty/Provider(s): [name] Reason for Treatment: Schizoaffective Disorder Does patient have an ACCT team?: Yes Does patient have Intensive In-House Services?  : No Does patient have [facility] services? : No Does patient have P4CC services?: No  ADL Screening (condition at time of admission) Patient's cognitive ability adequate to safely complete daily activities?: Yes Is the patient deaf or have difficulty hearing?: No Does the patient have difficulty seeing, even when wearing glasses/contacts?: No Does the patient have difficulty concentrating, remembering, or making decisions?: No Patient able to express need for assistance with ADLs?: Yes Does the patient have difficulty dressing or bathing?: No Independently performs ADLs?: Yes (appropriate for developmental age) Does the patient have difficulty walking or climbing stairs?: No Weakness of Legs: None Weakness of Arms/Hands: None       Abuse/Neglect Assessment (Assessment to be complete while patient is alone) Physical Abuse: Denies Verbal Abuse: Denies Sexual Abuse: Denies Exploitation of patient/patient's resources: Denies Self-Neglect: Denies Values / Beliefs Cultural Requests During Hospitalization: None Spiritual Requests During Hospitalization: None Consults Spiritual Care Consult Needed: No Social Work Consult Needed: No Regulatory affairs officer (For Healthcare) Does patient have an advance directive?: No    Additional Information 1:1 In Past 12 Months?: No CIRT Risk: No Elopement Risk: No Does patient have medical clearance?: Yes     Disposition:  Disposition Initial Assessment Completed for this Encounter: Yes Disposition of Patient: Inpatient treatment program Type of inpatient  treatment program: Adult (Per S Rankin, FNP, Pt meets inpt criteria)  Laurena Slimmer Malika Demario 12/04/2015 1:41 PM

## 2015-12-04 NOTE — ED Notes (Signed)
Received report on pt, pt resting with eyes closed, reps even and non labored, sitter remains at bedside,

## 2015-12-04 NOTE — Progress Notes (Signed)
Disposition CSW completed patient referrals to the following inpatient psych facilities:  [facility]  CSW will continue to follow patient for placement needs.  [facility] Disposition CSW [phone]

## 2015-12-04 NOTE — ED Notes (Signed)
[facility] called stating that they are tentatively going to accept patient. They state that if he has not found placement by morning they will accept him.

## 2015-12-05 NOTE — ED Notes (Signed)
Pt resting with eyes closed, sitter remains at bedside,  

## 2015-12-05 NOTE — BH Assessment (Signed)
Pt has been accepted at [facility]. Accepting physician is Dr. Reece Levy Pt may arrive 4.3.17 after 0900 Call report to [phone]  Rip Harbour, RN has been notified of acceptance.

## 2015-12-05 NOTE — ED Notes (Signed)
Pt ambulatory to restroom, denies any complaints, sitter remains at bedside,

## 2015-12-05 NOTE — ED Notes (Signed)
Jonathon from [facility] called reference update on pt, update given, advised that he would return call if beds available,

## 2015-12-05 NOTE — ED Provider Notes (Signed)
Patient accepted to [facility] by Dr. Reece Levy.  BP 158/88 mmHg  Pulse 73  Temp(Src) 97.5 F (36.4 C) (Oral)  Resp 18  Ht 5\' 4"  (1.626 m)  Wt 145 lb (65.772 kg)  BMI 24.88 kg/m2  SpO2 99%   Jeffrey Essex, MD 12/05/15 [phone]

## 2016-02-08 ENCOUNTER — Emergency Department
Admission: EM | Admit: 2016-02-08 | Discharge: 2016-02-09 | Disposition: A | Discharge status: Home / Self Care | Payer: Medicare Other | Attending: Emergency Medicine | Admitting: Emergency Medicine

## 2016-02-08 DIAGNOSIS — Z046 Encounter for general psychiatric examination, requested by authority: Secondary | ICD-10-CM | POA: Diagnosis present

## 2016-02-08 DIAGNOSIS — F1410 Cocaine abuse, uncomplicated: Secondary | ICD-10-CM | POA: Insufficient documentation

## 2016-02-08 DIAGNOSIS — F329 Major depressive disorder, single episode, unspecified: Secondary | ICD-10-CM

## 2016-02-08 DIAGNOSIS — F32A Depression, unspecified: Secondary | ICD-10-CM

## 2016-02-08 DIAGNOSIS — Z79899 Other long term (current) drug therapy: Secondary | ICD-10-CM | POA: Diagnosis not present

## 2016-02-08 DIAGNOSIS — F17210 Nicotine dependence, cigarettes, uncomplicated: Secondary | ICD-10-CM | POA: Insufficient documentation

## 2016-02-08 DIAGNOSIS — R45851 Suicidal ideations: Secondary | ICD-10-CM | POA: Diagnosis not present

## 2016-02-08 DIAGNOSIS — F1994 Other psychoactive substance use, unspecified with psychoactive substance-induced mood disorder: Secondary | ICD-10-CM | POA: Diagnosis not present

## 2016-02-08 DIAGNOSIS — F200 Paranoid schizophrenia: Secondary | ICD-10-CM | POA: Diagnosis not present

## 2016-02-08 LAB — COMPREHENSIVE METABOLIC PANEL
ALBUMIN: 4.1 g/dL (ref 3.5–5.0)
ALK PHOS: 46 U/L (ref 38–126)
ALT: 22 U/L (ref 17–63)
ANION GAP: 7 (ref 5–15)
AST: 31 U/L (ref 15–41)
BUN: 12 mg/dL (ref 6–20)
CHLORIDE: 106 mmol/L (ref 101–111)
CO2: 25 mmol/L (ref 22–32)
Calcium: 9.1 mg/dL (ref 8.9–10.3)
Creatinine, Ser: 1.13 mg/dL (ref 0.61–1.24)
GFR calc Af Amer: >60 mL/min (ref >60)
GFR calc non Af Amer: >60 mL/min (ref >60)
GLUCOSE: 93 mg/dL (ref 65–99)
POTASSIUM: 3.8 mmol/L (ref 3.5–5.1)
SODIUM: 138 mmol/L (ref 135–145)
Total Bilirubin: 0.6 mg/dL (ref 0.3–1.2)
Total Protein: 7.3 g/dL (ref 6.5–8.1)

## 2016-02-08 LAB — CBC
HEMATOCRIT: 42.2 % (ref 40.0–52.0)
HEMOGLOBIN: 14.8 g/dL (ref 13.0–18.0)
MCH: 30.2 pg (ref 26.0–34.0)
MCHC: 35.1 g/dL (ref 32.0–36.0)
MCV: 86.2 fL (ref 80.0–100.0)
Platelets: 257 10*3/uL (ref 150–440)
RBC: 4.9 MIL/uL (ref 4.40–5.90)
RDW: 14.3 % (ref 11.5–14.5)
WBC: 9 10*3/uL (ref 3.8–10.6)

## 2016-02-08 LAB — ETHANOL: Alcohol, Ethyl (B): <5 mg/dL (ref <5)

## 2016-02-08 LAB — SALICYLATE LEVEL

## 2016-02-08 LAB — ACETAMINOPHEN LEVEL

## 2016-02-08 MED ORDER — LORAZEPAM 0.5 MG PO TABS
0.5000 mg | ORAL_TABLET | Freq: Once | ORAL | Status: AC
  Administered 2016-02-08: 0.5 mg via ORAL
  Filled 2016-02-08: qty 1

## 2016-02-08 NOTE — ED Provider Notes (Signed)
[facility] Emergency Department Provider Note  ____________________________________________  Time seen: Approximately 5:23 PM  I have reviewed the triage vital signs and the nursing notes.   HISTORY  Chief Complaint Suicidal and Psychiatric Evaluation    HPI Jeffrey Coleman is a 35 y.o. male presents for evaluation of "schizophrenia". Patient reports he's been having thoughts of hurting himself the last several days, but is not taking any action to harm himself. He reports he has schizophrenia, and that he's been using crack cocaine about 3 days ago and then he starts to feel bad that he is been using cocaine and thus becomes slightly suicidal at times. He reports that is not new for him to feel suicidal, is not new for him to use crack.  Denies any fevers chills chest pain or other symptoms. He reports that he's been having difficulty with situation at home, threatening to harm himself to his mother.  Reports a previous history of psychiatric hospitalization, including at this hospital in the past.  Is on in the Abilify injections every few weeks.   Past Medical History  Diagnosis Date  . Schizo affective schizophrenia [facility])     Patient Active Problem List   Diagnosis Date Noted  . Schizophrenia, paranoid ([place]) 05/31/2015  . Cannabis use disorder, severe, dependence ([place]) 05/30/2015  . Adjustment disorder with mixed anxiety and depressed mood   . Sepsis ([place]) 08/22/2014  . Tobacco use disorder 08/22/2014  . SIRS (systemic inflammatory response syndrome) ([place]) 08/17/2014  . SOB (shortness of breath) 08/16/2014  . Cavitary pneumonia 08/16/2014  . Cough   . Pyrexia     History reviewed. No pertinent past surgical history.  Current Outpatient Rx  Name  Route  Sig  Dispense  Refill  . ARIPiprazole 400 MG SUSR   Intramuscular   Inject 400 mg into the muscle every 28 (twenty-eight) days. Next dose is due 10/07/2014   1 each   0   . benztropine  (COGENTIN) 1 MG tablet   Oral   Take 1 mg by mouth daily.          . traZODone (DESYREL) 100 MG tablet   Oral   Take 100 mg by mouth at bedtime.           Allergies Review of patient's allergies indicates no known allergies.  Family History  Problem Relation Age of Onset  . Cancer Father     Social History Social History  Substance Use Topics  . Smoking status: Current Every Day Smoker -- 0.50 packs/day for 15 years    Types: Cigarettes  . Smokeless tobacco: Never Used  . Alcohol Use: Yes     Comment: occasional    Review of Systems Constitutional: No fever/chills Eyes: No visual changes. ENT: No sore throat. Cardiovascular: Denies chest pain. Respiratory: Denies shortness of breath. Gastrointestinal: No abdominal pain.  No nausea, no vomiting.  No diarrhea.  No constipation. Genitourinary: Negative for dysuria. Musculoskeletal: Negative for back pain. Skin: Negative for rash. Neurological: Negative for headaches, focal weakness or numbness.  10-point ROS otherwise negative.  ____________________________________________   PHYSICAL EXAM:  VITAL SIGNS: ED Triage Vitals  Enc Vitals Group     BP 02/08/16 1617 124/79 mmHg     Pulse Rate 02/08/16 1617 86     Resp 02/08/16 1617 20     Temp 02/08/16 1617 98 F (36.7 C)     Temp Source 02/08/16 1617 Oral     SpO2 02/08/16 1617 99 %  Weight 02/08/16 1617 145 lb (65.772 kg)     Height 02/08/16 1617 5\' 4"  (1.626 m)     Head Cir --      Peak Flow --      Pain Score --      Pain Loc --      Pain Edu? --      Excl. in [place]? --    Constitutional: Alert and oriented. Well appearing and in no acute distress. Eyes: Conjunctivae are normal. PERRL. EOMI. Head: Atraumatic. Nose: No congestion/rhinnorhea. Mouth/Throat: Mucous membranes are moist.  Oropharynx non-erythematous. Neck: No stridor.   Cardiovascular: Normal rate, regular rhythm. Grossly normal heart sounds.  Good peripheral circulation. Respiratory:  Normal respiratory effort.  No retractions. Lungs CTAB. Gastrointestinal: Soft and nontender. No distention. No abdominal bruits. No CVA tenderness. Musculoskeletal: No lower extremity tenderness nor edema.  No joint effusions. Neurologic:  Normal speech and language. No gross focal neurologic deficits are appreciated. No gait instability. Skin:  Skin is warm, dry and intact. No rash noted. Psychiatric: Mood and affect are slightly flat, slightly depressed. Speech and behavior are normal.  ____________________________________________   LABS (all labs ordered are listed, but only abnormal results are displayed)  Labs Reviewed  ACETAMINOPHEN LEVEL - Abnormal; Notable for the following:    Acetaminophen (Tylenol), Serum <10 (*)    All other components within normal limits  COMPREHENSIVE METABOLIC PANEL  ETHANOL  SALICYLATE LEVEL  CBC  URINE DRUG SCREEN, QUALITATIVE ([place])   ____________________________________________  EKG   ____________________________________________  RADIOLOGY   ____________________________________________   PROCEDURES  Procedure(s) performed: None  Critical Care performed: No  ____________________________________________   INITIAL IMPRESSION / ASSESSMENT AND PLAN / ED COURSE  Pertinent labs & imaging results that were available during my care of the patient were reviewed by me and considered in my medical decision making (see chart for details).  Patient presents for evaluation of self harming ideation. He is under involuntary commitment. Patient denies any acute medical conditions, but it is noted he freely admits to also using crack. His labs appear stable, hemodynamic stable, and he has a normal mental status though slightly depressed in affect. I will continue him on involuntary commitment and have placed a consultation to psychiatry. Patient is felt to be medically clear from emergent conditions at 6 PM, and appropriate for further  psychiatric evaluation here at the ER. ____________________________________________   FINAL CLINICAL IMPRESSION(S) / ED DIAGNOSES  Final diagnoses:  Schizophrenia, paranoid ([place])  Cocaine abuse      Delman Kitten, MD 02/08/16 [phone]

## 2016-02-08 NOTE — ED Notes (Signed)

## 2016-02-08 NOTE — ED Notes (Signed)
Pt arrives to ER via [employer] from home under IVC. Pt was placed under IVC because of threatening mother and statements about hurting himself.

## 2016-02-09 DIAGNOSIS — F1994 Other psychoactive substance use, unspecified with psychoactive substance-induced mood disorder: Secondary | ICD-10-CM

## 2016-02-09 DIAGNOSIS — F200 Paranoid schizophrenia: Secondary | ICD-10-CM | POA: Diagnosis not present

## 2016-02-09 LAB — URINE DRUG SCREEN, QUALITATIVE (ARMC ONLY)
AMPHETAMINES, UR SCREEN: NOT DETECTED
BENZODIAZEPINE, UR SCRN: NOT DETECTED
Barbiturates, Ur Screen: NOT DETECTED
Cannabinoid 50 Ng, Ur ~~LOC~~: POSITIVE — AB
Cocaine Metabolite,Ur ~~LOC~~: POSITIVE — AB
MDMA (ECSTASY) UR SCREEN: NOT DETECTED
METHADONE SCREEN, URINE: NOT DETECTED
Opiate, Ur Screen: NOT DETECTED
Phencyclidine (PCP) Ur S: NOT DETECTED
Tricyclic, Ur Screen: NOT DETECTED

## 2016-02-09 MED ORDER — LORAZEPAM 0.5 MG PO TABS
0.5000 mg | ORAL_TABLET | Freq: Once | ORAL | Status: AC
  Administered 2016-02-09: 0.5 mg via ORAL
  Filled 2016-02-09: qty 1

## 2016-02-09 NOTE — ED Notes (Signed)
Patient denies SI/HI/AVH and pain. All belongings returned to patient and patient signed belongings inventory sheet. Discharge instructions reviewed. Patient returned to home with mother.

## 2016-02-09 NOTE — BH Assessment (Signed)
Assessment Note  Jeffrey Coleman is an 35 y.o. male who presents to the ER after having thoughts of ending his life. He further states, "I wanted to kill myself because I don't want use drugs no more." Patient admits to using Cocaine, Cannabis and Alcohol. "I'm fucked up in the head. I don't be thinking right." Patient have a history of schizophrenia and currently receives services from [facility]. Approximately 9 months ago, he was switched from the Haldol Injection to the Ablify Injection. He states, it has helped him and noticing improvement with the A/H and impulsivity.  However, due his drug use, he is having SI with plans to shoot his self. Per his report, he is able to get a gun from someone. He states he don't want to do it and he doesn't have any past attempts or gestures. "I always call somebody before I do something stupid."  He also reports of past use of Synthetic Marijuana. The last time of use was approximately a month ago. It resulted in him becoming paranoid and running to his neighbor's house to get help. "I start freaking out and spassing. I went next door and start banging on their door, saying Help Help. My mama needs help. I knew they wouldn't open the door if it was just for me." He further explains, due to the force of his hits to the door, a few damages were made on the door. "It was a cheap wood door. It wasn't like the normal doors. I'm guessing it was wood but it wasn't thick like regular wood." Because of it, he obtain a "Breaking and Entering charge." Other than that episode, he have no history of aggression or violence.  During the interview, the patient was calm, cooperative and polite. He reports of having A/H and states they are common for him. "Yea, I'm so use to them; I really don't pay them any attention."  He denies HI and V/H.  Diagnosis: Schizoaffective Disorder                    Substance Abuse Disorder.  Past Medical History:  Past  Medical History  Diagnosis Date  . Schizo affective schizophrenia ([place])     History reviewed. No pertinent past surgical history.  Family History:  Family History  Problem Relation Age of Onset  . Cancer Father     Social History:  reports that he has been smoking Cigarettes.  He has a 7.5 pack-year smoking history. He has never used smokeless tobacco. He reports that he drinks alcohol. He reports that he uses illicit drugs.  Additional Social History:  Alcohol / Drug Use Pain Medications: See PTA Prescriptions: See PTA Over the Counter: See PTA History of alcohol / drug use?: Yes Longest period of sobriety (when/how long): "30 days." Negative Consequences of Use: Financial, Legal, Personal relationships, Work / School Withdrawal Symptoms:  (Reports of none) Substance #1 Name of Substance 1: Cocaine 1 - Age of First Use: 19 1 - Amount (size/oz): "About $40 a day" 1 - Frequency: Daily 1 - Duration: "Since I was like 23" Approximately 10 years. 1 - Last Use / Amount: 02/06/2016 Substance #2 Name of Substance 2: Cannabis 2 - Age of First Use: 14 2 - Amount (size/oz): "About a dime bag ($10) a day." 2 - Frequency: "Every other day." 2 - Duration: "Since I ws 14." Approximately 20 years 2 - Last Use / Amount: 02/08/2016 Substance #3 Name of Substance  3: Alcohol 3 - Age of First Use: 17 3 - Amount (size/oz): "About a pint." 3 - Frequency: "At least once a week." 3 - Duration: "About 17 years." 3 - Last Use / Amount: 02/08/2016  CIWA: CIWA-Ar BP: 124/68 mmHg Pulse Rate: 61 COWS:    Allergies: No Known Allergies  Home Medications:  (Not in a hospital admission)  OB/GYN Status:  No LMP for male patient.  General Assessment Data Location of Assessment: [facility] ED TTS Assessment: In system Is this a Tele or Face-to-Face Assessment?: Face-to-Face Is this an Initial Assessment or a Re-assessment for this encounter?: Initial Assessment Marital status: Single Maiden name:  n/a Is patient pregnant?: No Pregnancy Status: No Living Arrangements: Parent Can pt return to current living arrangement?: Yes Admission Status: Involuntary Is patient capable of signing voluntary admission?: No Referral Source: Self/Family/Friend Insurance type: Medicare  Medical Screening Exam ([place]) Medical Exam completed: Yes  Crisis Care Plan Living Arrangements: Parent Legal Guardian: Other: Name of Psychiatrist: [name] ([facility]) Name of Therapist: [name]  Education Status Is patient currently in school?: No Current Grade: n/a Highest grade of school patient has completed: [employer] Name of school: n/a Contact person: n/a  Risk to self with the past 6 months Suicidal Ideation: Yes-Currently Present Has patient been a risk to self within the past 6 months prior to admission? : Yes Suicidal Intent: No-Not Currently/Within Last 6 Months Has patient had any suicidal intent within the past 6 months prior to admission? : No Is patient at risk for suicide?: No Suicidal Plan?: No-Not Currently/Within Last 6 Months ("I had thoughts of shooting myself.") Has patient had any suicidal plan within the past 6 months prior to admission? : Yes ("I had thoughts of shooting myself.") Access to Means: Yes Specify Access to Suicidal Means: "I can get a gone from anybody really." What has been your use of drugs/alcohol within the last 12 months?: Cocaine, Cannabis & Alcohol Previous Attempts/Gestures: No How many times?: 0 Other Self Harm Risks: Active Addiction Triggers for Past Attempts: Other (Comment) Intentional Self Injurious Behavior: None Family Suicide History: No Recent stressful life event(s): Other (Comment), Loss (Comment), Turmoil (Comment) (Active Addiction ) Persecutory voices/beliefs?: No Depression: Yes Depression Symptoms: Feeling angry/irritable, Feeling worthless/self pity, Loss of interest in usual pleasures, Guilt,  Fatigue, Isolating, Tearfulness Substance abuse history and/or treatment for substance abuse?: Yes Suicide prevention information given to non-admitted patients: Not applicable  Risk to Others within the past 6 months Homicidal Ideation: No Does patient have any lifetime risk of violence toward others beyond the six months prior to admission? : No Thoughts of Harm to Others: No Current Homicidal Intent: No Current Homicidal Plan: No Access to Homicidal Means: No Identified Victim: Reports of none History of harm to others?: No Assessment of Violence: None Noted Violent Behavior Description: Reports of none Does patient have access to weapons?: No Criminal Charges Pending?: Yes Describe Pending Criminal Charges: Felony-Attempting to breaking entering Does patient have a court date: Yes Court Date: 02/14/16 Is patient on probation?: No  Psychosis Hallucinations: None noted Delusions: None noted  Mental Status Report Appearance/Hygiene: In scrubs, In hospital gown, Unremarkable Eye Contact: Good Motor Activity: Freedom of movement, Unremarkable Speech: Unremarkable, Logical/coherent Level of Consciousness: Alert, Other (Comment) Mood: Depressed, Anxious, Pleasant Affect: Appropriate to circumstance Anxiety Level: None Thought Processes: Relevant, Coherent Judgement: Unimpaired Orientation: Person, Place, Time, Situation, Appropriate for developmental age Obsessive Compulsive Thoughts/Behaviors: Minimal  Cognitive Functioning Concentration: Normal  Memory: Recent Intact, Remote Intact IQ: Average Insight: Fair Impulse Control: Poor Appetite: Fair Weight Loss: 0 Weight Gain: 0 Sleep: No Change Total Hours of Sleep: 7 Vegetative Symptoms: None  ADLScreening [facility] Assessment Services) Patient's cognitive ability adequate to safely complete daily activities?: Yes Patient able to express need for assistance with ADLs?: Yes Independently performs ADLs?: Yes (appropriate  for developmental age)  Prior Inpatient Therapy Prior Inpatient Therapy: Yes Prior Therapy Dates: 04/2015 Prior Therapy Facilty/Provider(s): [facility] Reason for Treatment: "Suicidal Thoughts and drug abuse and the voices."  Prior Outpatient Therapy Prior Outpatient Therapy: Yes Prior Therapy Dates: Current Prior Therapy Facilty/Provider(s): [place] Reason for Treatment: Schizophrenia Does patient have an ACCT team?: Yes ([facility]) Does patient have [place] services? : No Does patient have [facility] services?: No  ADL Screening (condition at time of admission) Patient's cognitive ability adequate to safely complete daily activities?: Yes Is the patient deaf or have difficulty hearing?: No Does the patient have difficulty seeing, even when wearing glasses/contacts?: No Does the patient have difficulty concentrating, remembering, or making decisions?: No Patient able to express need for assistance with ADLs?: Yes Does the patient have difficulty dressing or bathing?: No Independently performs ADLs?: Yes (appropriate for developmental age) Does the patient have difficulty walking or climbing stairs?: No Weakness of Legs: None Weakness of Arms/Hands: None  Home Assistive Devices/Equipment Home Assistive Devices/Equipment: None  Therapy Consults (therapy consults require a physician order) PT Evaluation Needed: No OT Evalulation Needed: No SLP Evaluation Needed: No Abuse/Neglect Assessment (Assessment to be complete while patient is alone) Physical Abuse: Denies Verbal Abuse: Denies Sexual Abuse: Denies Exploitation of patient/patient's resources: Denies Self-Neglect: Denies Values / Beliefs Cultural Requests During Hospitalization: None Spiritual Requests During Hospitalization: None Consults Spiritual Care Consult Needed: No Social Work Consult Needed: No Regulatory affairs officer (For Healthcare) Does patient have an advance directive?: No    Additional Information 1:1 In  Past 12 Months?: No CIRT Risk: No Elopement Risk: No Does patient have medical clearance?: Yes  Child/Adolescent Assessment Running Away Risk: Denies (Patient is an adult)  Disposition:  Disposition Initial Assessment Completed for this Encounter: Yes Disposition of Patient: Other dispositions (ER MD Psych Consult Ordered) Other disposition(s): Other (Comment) (ER MD Psych Consult Ordered)  On Site Evaluation by:   Reviewed with Physician:    Gunnar Fusi MS, LCAS, LPC, [place], CCSI Therapeutic Triage Specialist 02/09/2016 10:43 AM

## 2016-02-09 NOTE — ED Notes (Signed)
Patient currently in room resting. Patient confirms that his chest pain has improved. No signs of acute distress noted at this time. Maintained on 15 minute checks and observation by security camera for safety. y

## 2016-02-09 NOTE — ED Notes (Signed)
Patient resting quietly in room. No noted distress or abnormal behaviors noted. Will continue 15 minute checks and observation by security camera for safety. 

## 2016-02-09 NOTE — ED Notes (Signed)
ENVIRONMENTAL ASSESSMENT Potentially harmful objects out of patient reach: Yes Personal belongings secured: Yes Patient dressed in hospital provided attire only: Yes Plastic bags out of patient reach: Yes Patient care equipment (cords, cables, call bells, lines, and drains) shortened, removed, or accounted for: Yes Equipment and supplies removed from bottom of stretcher: Yes Potentially toxic materials out of patient reach: Yes Sharps container removed or out of patient reach: Yes  Patient currently in room sleeping. No signs of acute distress noted. Maintained on 15 minute checks and observation by security camera for safety.

## 2016-02-09 NOTE — ED Provider Notes (Signed)
Patient complain of some chest achiness. He felt it was from his anxiety. EKG was done appears to be the same as previous EKGs there is some early re-pole but again that is the same as previously.  Nena Polio, MD 02/09/16 1136

## 2016-02-09 NOTE — Consult Note (Signed)
[facility] Face-to-Face Psychiatry Consult   Reason for Consult:  Consult for this 35 year old man with a history of schizophrenia and substance abuse brought in under involuntary commitment. Referring Physician:  Cinda Quest Patient Identification: Jeffrey Coleman MRN:  [id number] Principal Diagnosis: Substance induced mood disorder ([place]) Diagnosis:   Patient Active Problem List   Diagnosis Date Noted  . Substance induced mood disorder ([place]) [F19.94] 02/09/2016  . Cocaine abuse [F14.10] 02/09/2016  . Schizophrenia, paranoid ([place]) [F20.0] 05/31/2015  . Cannabis use disorder, severe, dependence ([place]) [F12.20] 05/30/2015  . Adjustment disorder with mixed anxiety and depressed mood [F43.23]   . Sepsis ([place]) [A41.9] 08/22/2014  . Tobacco use disorder [F17.200] 08/22/2014  . SIRS (systemic inflammatory response syndrome) ([place]) [R65.10] 08/17/2014  . SOB (shortness of breath) [R06.02] 08/16/2014  . Cavitary pneumonia [J18.9, J98.4] 08/16/2014  . Cough [R05]   . Pyrexia [R50.9]     Total Time spent with patient: 1 hour  Subjective:   Jeffrey Coleman is a 35 y.o. male patient admitted with "I keep feeling suicidal".  HPI:  Patient interviewed. Chart reviewed. Also spoke with the physician for the act team that follows him. Patient sent here under IVC. Apparently he was at a meeting with his act team yesterday when he lost his temper with his mother. Got into a shoving match with his mother and then took off walking down the middle of the street. Patient says he knows that his mood is out of control. He has suicidal thoughts but has no intention of actually acting on it. He is smoking crack pretty much every day and also has been smoking K2 recently. He knows that both of them make him agitated and crazy. Uses marijuana regularly as well. He has been compliant with his Abilify injection. The act team has tried to get him into substance abuse programs multiple times. He has gotten himself thrown out or  has been noncompliant with all of it. Patient denies any intent to kill himself denies any thoughts of actually hurting anybody else. He identifies his primary problem as being the substance abuse. He says he has voices that he hears most of the time but they're very quiet and don't cause him any trouble as long as he stays on his medicine. Her [place] history: Patient does not have any significant medical problems ongoing outside of his mental health problems.  Social history: Recently has been staying with his mother. This is when he is not just staying out in the streets. Gets into fights with his family a lot. Not able to sustain any substance abuse treatment. He does have an active team that follows him.  Substance abuse history: Long history of abuse of drugs mostly cocaine now he is abusing K2 as well. Also marijuana. Mostly gets bizarre and agitated with his behavior when he is intoxicated. Attempts of been made to get him the best substance abuse treatment that is a locally available and he has either quit or gotten thrown out.  Past Psychiatric History: Patient has a history of schizophrenia but is well controlled as far as his schizophrenia symptoms when he takes his Abilify long-acting shot. Behavior and mood problems currently appear to be primarily due to his substance abuse. He does have a history of previous admissions to the hospital. Denies having seriously tried to kill himself in the past.  Risk to Self: Suicidal Ideation: Yes-Currently Present Suicidal Intent: No-Not Currently/Within Last 6 Months Is patient at risk for suicide?: No Suicidal  Plan?: No-Not Currently/Within Last 6 Months ("I had thoughts of shooting myself.") Access to Means: Yes Specify Access to Suicidal Means: "I can get a gone from anybody really." What has been your use of drugs/alcohol within the last 12 months?: Cocaine, Cannabis & Alcohol How many times?: 0 Other Self Harm Risks: Active  Addiction Triggers for Past Attempts: Other (Comment) Intentional Self Injurious Behavior: None Risk to Others: Homicidal Ideation: No Thoughts of Harm to Others: No Current Homicidal Intent: No Current Homicidal Plan: No Access to Homicidal Means: No Identified Victim: Reports of none History of harm to others?: No Assessment of Violence: None Noted Violent Behavior Description: Reports of none Does patient have access to weapons?: No Criminal Charges Pending?: Yes Describe Pending Criminal Charges: Felony-Attempting to breaking entering Does patient have a court date: Yes Court Date: 02/14/16 Prior Inpatient Therapy: Prior Inpatient Therapy: Yes Prior Therapy Dates: 04/2015 Prior Therapy Facilty/Provider(s): [facility] Reason for Treatment: "Suicidal Thoughts and drug abuse and the voices." Prior Outpatient Therapy: Prior Outpatient Therapy: Yes Prior Therapy Dates: Current Prior Therapy Facilty/Provider(s): [facility] Reason for Treatment: Schizophrenia Does patient have an ACCT team?: Yes ([facility]) Does patient have [place] services? : No Does patient have P4CC services?: No  Past Medical History:  Past Medical History  Diagnosis Date  . Schizo affective schizophrenia ([place])    History reviewed. No pertinent past surgical history. Family History:  Family History  Problem Relation Age of Onset  . Cancer Father    Family Psychiatric  History: Patient does not know of any family history of mental health problems except for one person in the family with alcohol abuse Social History:  History  Alcohol Use  . Yes    Comment: occasional     History  Drug Use  . Yes    Comment: crack- last Thursday    Social History   Social History  . Marital Status: Single    Spouse Name: N/A  . Number of Children: N/A  . Years of Education: N/A   Social History Main Topics  . Smoking status: Current Every Day Smoker -- 0.50 packs/day for 15 years    Types: Cigarettes  .  Smokeless tobacco: Never Used  . Alcohol Use: Yes     Comment: occasional  . Drug Use: Yes     Comment: crack- last Thursday  . Sexual Activity: Not Asked   Other Topics Concern  . None   Social History Narrative   Additional Social History:    Allergies:  No Known Allergies  Labs:  Results for orders placed or performed during the hospital encounter of 02/08/16 (from the past 48 hour(s))  Urine Drug Screen, Qualitative     Status: Abnormal   Collection Time: 02/08/16  8:29 AM  Result Value Ref Range   Tricyclic, Ur Screen NONE DETECTED NONE DETECTED   Amphetamines, Ur Screen NONE DETECTED NONE DETECTED   MDMA (Ecstasy)Ur Screen NONE DETECTED NONE DETECTED   Cocaine Metabolite,Ur [place] POSITIVE (A) NONE DETECTED   Opiate, Ur Screen NONE DETECTED NONE DETECTED   Phencyclidine (PCP) Ur S NONE DETECTED NONE DETECTED   Cannabinoid 50 Ng, Ur [place] POSITIVE (A) NONE DETECTED   Barbiturates, Ur Screen NONE DETECTED NONE DETECTED   Benzodiazepine, Ur Scrn NONE DETECTED NONE DETECTED   Methadone Scn, Ur NONE DETECTED NONE DETECTED    Comment: (NOTE) 309  Tricyclics, urine               Cutoff 1000 ng/mL 200  Amphetamines, urine             Cutoff 1000 ng/mL 300  MDMA (Ecstasy), urine           Cutoff 500 ng/mL 400  Cocaine Metabolite, urine       Cutoff 300 ng/mL 500  Opiate, urine                   Cutoff 300 ng/mL 600  Phencyclidine (PCP), urine      Cutoff 25 ng/mL 700  Cannabinoid, urine              Cutoff 50 ng/mL 800  Barbiturates, urine             Cutoff 200 ng/mL 900  Benzodiazepine, urine           Cutoff 200 ng/mL 1000 Methadone, urine                Cutoff 300 ng/mL 1100 1200 The urine drug screen provides only a preliminary, unconfirmed 1300 analytical test result and should not be used for non-medical 1400 purposes. Clinical consideration and professional judgment should 1500 be applied to any positive drug screen result due to possible 1600 interfering substances. A  more specific alternate chemical method 1700 must be used in order to obtain a confirmed analytical result.  1800 Gas chromato graphy / mass spectrometry (GC/MS) is the preferred 1900 confirmatory method.   Comprehensive metabolic panel     Status: None   Collection Time: 02/08/16  4:20 PM  Result Value Ref Range   Sodium 138 135 - 145 mmol/L   Potassium 3.8 3.5 - 5.1 mmol/L   Chloride 106 101 - 111 mmol/L   CO2 25 22 - 32 mmol/L   Glucose, Bld 93 65 - 99 mg/dL   BUN 12 6 - 20 mg/dL   Creatinine, Ser 1.13 0.61 - 1.24 mg/dL   Calcium 9.1 8.9 - 10.3 mg/dL   Total Protein 7.3 6.5 - 8.1 g/dL   Albumin 4.1 3.5 - 5.0 g/dL   AST 31 15 - 41 U/L   ALT 22 17 - 63 U/L   Alkaline Phosphatase 46 38 - 126 U/L   Total Bilirubin 0.6 0.3 - 1.2 mg/dL   GFR calc non Af Amer >60 >60 mL/min   GFR calc Af Amer >60 >60 mL/min    Comment: (NOTE) The eGFR has been calculated using the CKD EPI equation. This calculation has not been validated in all clinical situations. eGFR's persistently <60 mL/min signify possible Chronic Kidney Disease.    Anion gap 7 5 - 15  Ethanol     Status: None   Collection Time: 02/08/16  4:20 PM  Result Value Ref Range   Alcohol, Ethyl (B) <5 <5 mg/dL    Comment:        LOWEST DETECTABLE LIMIT FOR SERUM ALCOHOL IS 5 mg/dL FOR MEDICAL PURPOSES ONLY   Salicylate level     Status: None   Collection Time: 02/08/16  4:20 PM  Result Value Ref Range   Salicylate Lvl <0.6 2.8 - 30.0 mg/dL  Acetaminophen level     Status: Abnormal   Collection Time: 02/08/16  4:20 PM  Result Value Ref Range   Acetaminophen (Tylenol), Serum <10 (L) 10 - 30 ug/mL    Comment:        THERAPEUTIC CONCENTRATIONS VARY SIGNIFICANTLY. A RANGE OF 10-30 ug/mL MAY BE AN EFFECTIVE CONCENTRATION FOR MANY PATIENTS. HOWEVER, SOME ARE BEST TREATED AT CONCENTRATIONS OUTSIDE  THIS RANGE. ACETAMINOPHEN CONCENTRATIONS >150 ug/mL AT 4 HOURS AFTER INGESTION AND >50 ug/mL AT 12 HOURS AFTER INGESTION  ARE OFTEN ASSOCIATED WITH TOXIC REACTIONS.   cbc     Status: None   Collection Time: 02/08/16  4:20 PM  Result Value Ref Range   WBC 9.0 3.8 - 10.6 K/uL   RBC 4.90 4.40 - 5.90 MIL/uL   Hemoglobin 14.8 13.0 - 18.0 g/dL   HCT 42.2 40.0 - 52.0 %   MCV 86.2 80.0 - 100.0 fL   MCH 30.2 26.0 - 34.0 pg   MCHC 35.1 32.0 - 36.0 g/dL   RDW 14.3 11.5 - 14.5 %   Platelets 257 150 - 440 K/uL    No current facility-administered medications for this encounter.   Current Outpatient Prescriptions  Medication Sig Dispense Refill  . ARIPiprazole 400 MG SUSR Inject 400 mg into the muscle every 28 (twenty-eight) days. Next dose is due 10/07/2014 1 each 0  . benztropine (COGENTIN) 1 MG tablet Take 1 mg by mouth daily.     . traZODone (DESYREL) 100 MG tablet Take 100 mg by mouth at bedtime.      Musculoskeletal: Strength & Muscle Tone: within normal limits Gait & Station: normal Patient leans: N/A  Psychiatric Specialty Exam: Physical Exam  Constitutional: He appears well-developed and well-nourished.  HENT:  Head: Normocephalic and atraumatic.  Eyes: Conjunctivae are normal. Pupils are equal, round, and reactive to light.  Neck: Normal range of motion.  Cardiovascular: Normal rate and normal heart sounds.   Respiratory: Effort normal. No respiratory distress.  GI: Soft.  Musculoskeletal: Normal range of motion.  Neurological: He is alert.  Skin: Skin is warm and dry.  Psychiatric: His speech is normal and behavior is normal. Thought content normal. His mood appears anxious. Cognition and memory are normal. He expresses impulsivity.    Review of Systems  Constitutional: Negative.   HENT: Negative.   Eyes: Negative.   Respiratory: Negative.   Cardiovascular: Negative.   Gastrointestinal: Negative.   Musculoskeletal: Negative.   Skin: Negative.   Neurological: Negative.   Psychiatric/Behavioral: Positive for depression, suicidal ideas, hallucinations and substance abuse. Negative for  memory loss. The patient is nervous/anxious. The patient does not have insomnia.     Blood pressure 116/72, pulse 74, temperature 98.5 F (36.9 C), temperature source Oral, resp. rate 16, height _0  (1.626 m), weight 65.772 kg (145 lb), SpO2 98 %.Body mass index is 24.88 kg/(m^2).  General Appearance: Casual  Eye Contact:  Fair  Speech:  Clear and Coherent  Volume:  Normal  Mood:  Dysphoric  Affect:  Constricted  Thought Process:  Goal Directed  Orientation:  Full (Time, Place, and Person)  Thought Content:  Logical  Suicidal Thoughts:  Yes.  without intent/plan  Homicidal Thoughts:  No  Memory:  Immediate;   Good Recent;   Fair Remote;   Fair  Judgement:  Impaired  Insight:  Shallow  Psychomotor Activity:  Decreased  Concentration:  Concentration: Fair  Recall:  [employer] of Knowledge:  Fair  Language:  Fair  Akathisia:  No  Handed:  Right  AIMS (if indicated):     Assets:  Desire for Improvement Housing Physical Health  ADL's:  Intact  Cognition:  WNL  Sleep:        Treatment Plan Summary: Plan 35 year old male with a history of schizophrenia and substance abuse. Although he describes himself as having suicidal thoughts he is clearly having no intention or plan of acting  on it. Mostly he is describing feeling angry at himself. Patient is not actively psychotic right now. Has reasonably good insight and judgment. Unlikely to benefit from inpatient treatment as he primarily needs substance abuse treatment. Reviewed case with him. I don't think he is a good candidate for admission to the hospital. Discontinue IVC. He hasn't act team in place and will follow-up with them. I will pass on this information to his primary psychiatrist. No change to his medication.  Disposition: Patient does not meet criteria for psychiatric inpatient admission. Supportive therapy provided about ongoing stressors.  Alethia Berthold, MD 02/09/2016 5:25 PM

## 2016-02-09 NOTE — ED Provider Notes (Signed)
-----------------------------------------   5:57 AM on 02/09/2016 -----------------------------------------   Blood pressure 124/79, pulse 86, temperature 98 F (36.7 C), temperature source Oral, resp. rate 20, height 5\' 4"  (1.626 m), weight 145 lb (65.772 kg), SpO2 99 %.  The patient had no acute events since last update.  Calm and cooperative at this time.  Disposition is pending per Psychiatry/Behavioral Medicine team recommendations.     Daymon Larsen, MD 02/09/16 [phone]

## 2016-02-09 NOTE — ED Notes (Signed)
Patient currently endorses SI but is able to contract for safety while in the hospital. He states that he has a plan to shoot himself from a "street gun", as he does not own a personal gun. He denies HI and states that sometimes he has auditory hallucinations but that he has not had any recently. Endorses chest pain rated a 5/10, which he believes is related to anxiety. Currently awaiting physician orders to address pain. Patient is calm and cooperative, no signs of acute distress at this time. Maintained on 15 minute checks and observation by security camera for safety.

## 2016-02-09 NOTE — ED Notes (Signed)
Patient currently in dayroom speaking loudly on phone with mother in an attempt to get mother to pick him up from hospital. Patient seems to be upset but is cooperative at this time. Will monitor for increased agitation. Maintained on 15 minute checks and observation by security camera for safety.

## 2016-02-09 NOTE — ED Notes (Addendum)
Patient received ativan 0.5 mg and had an EKG completed to address chest pain. Will reassess pain. Patient currently in room talking with TTS, no signs of acute distress noted. Maintained on 15 minute checks and observation by security camera for safety.

## 2016-02-09 NOTE — ED Notes (Signed)
Writer contacted [employer] at [phone]) to request patient pickup from the hospital. Representative stated that she would try to call the mother, aunt, or team lead to request transportation. Representative stated that she would call back soon with determination.

## 2016-02-09 NOTE — ED Notes (Signed)
Patient asleep in room. No noted distress or abnormal behavior. Will continue 15 minute checks and observation by security cameras for safety. 

## 2016-03-12 ENCOUNTER — Emergency Department (HOSPITAL_COMMUNITY)
Admission: EM | Admit: 2016-03-12 | Discharge: 2016-03-13 | Disposition: A | Discharge status: Other Acute Inpatient Hospital | Payer: Medicare Other | Attending: Emergency Medicine | Admitting: Emergency Medicine

## 2016-03-12 ENCOUNTER — Encounter (HOSPITAL_COMMUNITY): Payer: Self-pay | Admitting: Emergency Medicine

## 2016-03-12 DIAGNOSIS — E876 Hypokalemia: Secondary | ICD-10-CM | POA: Insufficient documentation

## 2016-03-12 DIAGNOSIS — F209 Schizophrenia, unspecified: Secondary | ICD-10-CM | POA: Insufficient documentation

## 2016-03-12 DIAGNOSIS — R45851 Suicidal ideations: Secondary | ICD-10-CM | POA: Insufficient documentation

## 2016-03-12 DIAGNOSIS — Z79899 Other long term (current) drug therapy: Secondary | ICD-10-CM | POA: Insufficient documentation

## 2016-03-12 DIAGNOSIS — F32A Depression, unspecified: Secondary | ICD-10-CM

## 2016-03-12 DIAGNOSIS — F17210 Nicotine dependence, cigarettes, uncomplicated: Secondary | ICD-10-CM | POA: Diagnosis not present

## 2016-03-12 DIAGNOSIS — Z046 Encounter for general psychiatric examination, requested by authority: Secondary | ICD-10-CM | POA: Diagnosis present

## 2016-03-12 DIAGNOSIS — F329 Major depressive disorder, single episode, unspecified: Secondary | ICD-10-CM | POA: Insufficient documentation

## 2016-03-12 NOTE — ED Notes (Signed)
Pt states he has been having suicidal thoughts x 4 days without a plan.

## 2016-03-13 ENCOUNTER — Inpatient Hospital Stay
Admission: RE | Admit: 2016-03-13 | Discharge: 2016-03-15 | DRG: 885 | Disposition: A | Discharge status: Home / Self Care | Payer: Medicare Other | Source: Intra-hospital | Attending: Psychiatry | Admitting: Psychiatry

## 2016-03-13 DIAGNOSIS — G4700 Insomnia, unspecified: Secondary | ICD-10-CM | POA: Diagnosis present

## 2016-03-13 DIAGNOSIS — R45851 Suicidal ideations: Secondary | ICD-10-CM | POA: Diagnosis present

## 2016-03-13 DIAGNOSIS — F200 Paranoid schizophrenia: Principal | ICD-10-CM | POA: Diagnosis present

## 2016-03-13 DIAGNOSIS — Z79899 Other long term (current) drug therapy: Secondary | ICD-10-CM

## 2016-03-13 DIAGNOSIS — Z809 Family history of malignant neoplasm, unspecified: Secondary | ICD-10-CM

## 2016-03-13 DIAGNOSIS — F329 Major depressive disorder, single episode, unspecified: Secondary | ICD-10-CM | POA: Diagnosis not present

## 2016-03-13 DIAGNOSIS — F17210 Nicotine dependence, cigarettes, uncomplicated: Secondary | ICD-10-CM | POA: Diagnosis present

## 2016-03-13 DIAGNOSIS — F1220 Cannabis dependence, uncomplicated: Secondary | ICD-10-CM

## 2016-03-13 DIAGNOSIS — F17200 Nicotine dependence, unspecified, uncomplicated: Secondary | ICD-10-CM | POA: Diagnosis present

## 2016-03-13 LAB — RAPID URINE DRUG SCREEN, HOSP PERFORMED
Amphetamines: NOT DETECTED
BENZODIAZEPINES: NOT DETECTED
Barbiturates: NOT DETECTED
Cocaine: NOT DETECTED
OPIATES: NOT DETECTED
TETRAHYDROCANNABINOL: POSITIVE — AB

## 2016-03-13 LAB — COMPREHENSIVE METABOLIC PANEL
ALT: 18 U/L (ref 17–63)
ANION GAP: 5 (ref 5–15)
AST: 18 U/L (ref 15–41)
Albumin: 3.7 g/dL (ref 3.5–5.0)
Alkaline Phosphatase: 45 U/L (ref 38–126)
BUN: 14 mg/dL (ref 6–20)
CHLORIDE: 108 mmol/L (ref 101–111)
CO2: 24 mmol/L (ref 22–32)
Calcium: 8.6 mg/dL — ABNORMAL LOW (ref 8.9–10.3)
Creatinine, Ser: 1.02 mg/dL (ref 0.61–1.24)
Glucose, Bld: 113 mg/dL — ABNORMAL HIGH (ref 65–99)
POTASSIUM: 3.3 mmol/L — AB (ref 3.5–5.1)
SODIUM: 137 mmol/L (ref 135–145)
Total Bilirubin: 0.4 mg/dL (ref 0.3–1.2)
Total Protein: 6.7 g/dL (ref 6.5–8.1)

## 2016-03-13 LAB — CBC WITH DIFFERENTIAL/PLATELET
Basophils Absolute: 0 10*3/uL (ref 0.0–0.1)
Basophils Relative: 0 %
EOS ABS: 0.2 10*3/uL (ref 0.0–0.7)
Eosinophils Relative: 2 %
HEMATOCRIT: 39.9 % (ref 39.0–52.0)
HEMOGLOBIN: 14.4 g/dL (ref 13.0–17.0)
LYMPHS ABS: 4.5 10*3/uL — AB (ref 0.7–4.0)
Lymphocytes Relative: 45 %
MCH: 30.1 pg (ref 26.0–34.0)
MCHC: 36.1 g/dL — AB (ref 30.0–36.0)
MCV: 83.5 fL (ref 78.0–100.0)
MONOS PCT: 6 %
Monocytes Absolute: 0.6 10*3/uL (ref 0.1–1.0)
NEUTROS ABS: 4.6 10*3/uL (ref 1.7–7.7)
NEUTROS PCT: 47 %
Platelets: 261 10*3/uL (ref 150–400)
RBC: 4.78 MIL/uL (ref 4.22–5.81)
RDW: 13.7 % (ref 11.5–15.5)
WBC: 10 10*3/uL (ref 4.0–10.5)

## 2016-03-13 LAB — ETHANOL

## 2016-03-13 MED ORDER — ZOLPIDEM TARTRATE 5 MG PO TABS: 10.0000 mg | ORAL_TABLET | Freq: Every evening | ORAL | Status: DC | PRN

## 2016-03-13 MED ORDER — ALUM & MAG HYDROXIDE-SIMETH 200-200-20 MG/5ML PO SUSP: 30.0000 mL | ORAL | Status: DC | PRN

## 2016-03-13 MED ORDER — NICOTINE 21 MG/24HR TD PT24
21.0000 mg | MEDICATED_PATCH | Freq: Every day | TRANSDERMAL | Status: DC
  Filled 2016-03-13: qty 1

## 2016-03-13 MED ORDER — MAGNESIUM HYDROXIDE 400 MG/5ML PO SUSP: 30.0000 mL | Freq: Every day | ORAL | Status: DC | PRN

## 2016-03-13 MED ORDER — POTASSIUM CHLORIDE CRYS ER 20 MEQ PO TBCR
40.0000 meq | EXTENDED_RELEASE_TABLET | Freq: Two times a day (BID) | ORAL | Status: DC
  Administered 2016-03-13: 40 meq via ORAL
  Filled 2016-03-13: qty 2

## 2016-03-13 MED ORDER — IBUPROFEN 400 MG PO TABS: 600.0000 mg | ORAL_TABLET | Freq: Three times a day (TID) | ORAL | Status: DC | PRN

## 2016-03-13 MED ORDER — TRAZODONE HCL 50 MG PO TABS: 100.0000 mg | ORAL_TABLET | Freq: Every day | ORAL | Status: DC

## 2016-03-13 MED ORDER — TRAZODONE HCL 50 MG PO TABS
150.0000 mg | ORAL_TABLET | Freq: Every day | ORAL | Status: DC
  Administered 2016-03-13 – 2016-03-14 (×2): 150 mg via ORAL
  Filled 2016-03-13 (×2): qty 1

## 2016-03-13 MED ORDER — ACETAMINOPHEN 325 MG PO TABS: 650.0000 mg | ORAL_TABLET | ORAL | Status: DC | PRN

## 2016-03-13 MED ORDER — BENZTROPINE MESYLATE 1 MG PO TABS
1.0000 mg | ORAL_TABLET | Freq: Every day | ORAL | Status: DC
  Administered 2016-03-13: 1 mg via ORAL
  Filled 2016-03-13: qty 1

## 2016-03-13 MED ORDER — ACETAMINOPHEN 325 MG PO TABS: 650.0000 mg | ORAL_TABLET | Freq: Four times a day (QID) | ORAL | Status: DC | PRN

## 2016-03-13 MED ORDER — ONDANSETRON HCL 4 MG PO TABS: 4.0000 mg | ORAL_TABLET | Freq: Three times a day (TID) | ORAL | Status: DC | PRN

## 2016-03-13 NOTE — Progress Notes (Signed)
Patient pleasant and cooperative during admission assessment. Patient denies SI/HI at this time. Patient denies AVH. Patient informed of fall risk status, fall risk assessed "low" at this time. Patient oriented to unit/staff/room. Patient denies any questions/concerns at this time. Patient safe on unit with Q15 minute checks for safety. Skin assessment & body search done.No contraband found. 

## 2016-03-13 NOTE — ED Notes (Signed)
Pt left with [facility] to transport to [facility].  Pt ambulatory at discharge.  Gigi at [facility] was called and notified that pt is en route to facility.

## 2016-03-13 NOTE — [facility] Counselor (Signed)
Patient is to be admitted to [facility] per Dr. Dwyane Dee.  Attending Physician will be Dr. Jerilee Hoh.   Patient has been assigned to room 322, by [facility] Nurse. Pt nurse Megan at [facility] ED has been notified. Beckhem Isadore K. Nash Shearer, LPC-A, [facility]  Counselor 03/13/2016 11:11 AM

## 2016-03-13 NOTE — ED Notes (Signed)
Called [facility] for transport to [facility].  Driver will be here  in 35-40 minutes from [place].  Nurse informed.

## 2016-03-13 NOTE — B[facility] Counselor (Deleted)
NA

## 2016-03-13 NOTE — ED Provider Notes (Signed)
CSN: [id number]     Arrival date & time 03/12/16  2350 History   By signing my name below, I, [name], attest that this documentation has been prepared under the direction and in the presence of Rolland Porter, MD at 00:34 AM.  Electronically signed, [name], [name]. 03/13/2016. 1:45 AM.   Chief Complaint  Patient presents with  . V70.1   The history is provided by the patient. No language interpreter was used.   HPI Comments: Jeffrey Coleman is a 35 y.o. male with a PMHx of schizoaffective schizophrenia, who presents to the Emergency Department complaining of SI onset 3 days ago. Pt reports that he normally hears voices but states that he gets a Aripiprazole shot once a month for auditory hallucinations. Pt reports that the voices have been  "fairly calm" recently. Pt states that he has a Hx of depression but states he has not been treated for it before. Pt further states that this is the worse that it has ever been. Pt reports he is a smoker and has occasional etOH use. Pt is on disability for schizophrenia. Pt States he thought about slitting his wrists. Pt states he has a Hx of cutting his wrist a long time ago but had not done it since. Pt states he did not tell anyone during the first instance and was never hospitalized. Pt denies any recent stress or reason to be depressed. He states the voices do not tell him to hurt himself. Pt states he currently takes cogentin and trazodone. Pt denies any cough, fever.   PCP [facility] team  Past Medical History  Diagnosis Date  . Schizo affective schizophrenia ([place])    History reviewed. No pertinent past surgical history. Family History  Problem Relation Age of Onset  . Cancer Father    Social History  Substance Use Topics  . Smoking status: Current Every Day Smoker -- 0.50 packs/day for 15 years    Types: Cigarettes  . Smokeless tobacco: Never Used  . Alcohol Use: Yes     Comment:  occasional  on disability  Review of Systems  Constitutional: Negative for fever.  Respiratory: Negative for cough.   Psychiatric/Behavioral: Positive for suicidal ideas.  All other systems reviewed and are negative.     Allergies  Review of patient's allergies indicates no known allergies.  Home Medications   Prior to Admission medications   Medication Sig Start Date End Date Taking? Authorizing Provider  ARIPiprazole 400 MG SUSR Inject 400 mg into the muscle every 28 (twenty-eight) days. Next dose is due 10/07/2014 10/07/14   Shuvon B Rankin, NP  benztropine (COGENTIN) 1 MG tablet Take 1 mg by mouth daily.     Historical Provider, MD  traZODone (DESYREL) 100 MG tablet Take 100 mg by mouth at bedtime. 11/23/15   Historical Provider, MD   BP 115/69 mmHg  Pulse 56  Temp(Src) 98.3 F (36.8 C) (Oral)  Resp 20  Ht 5\' 4"  (1.626 m)  Wt 145 lb (65.772 kg)  BMI 24.88 kg/m2  SpO2 100%  Vital signs normal   Physical Exam  Constitutional: He is oriented to person, place, and time. He appears well-developed and well-nourished.  Non-toxic appearance. He does not appear ill. No distress.  HENT:  Head: Normocephalic and atraumatic.  Right Ear: External ear normal.  Left Ear: External ear normal.  Nose: Nose normal. No mucosal edema or rhinorrhea.  Mouth/Throat: Oropharynx is clear and moist and  mucous membranes are normal. No dental abscesses or uvula swelling.  Neck: Normal range of motion and full passive range of motion without pain. Neck supple.  Cardiovascular: Normal rate.  Exam reveals no gallop and no friction rub.   No murmur heard. Pulmonary/Chest: Effort normal. No respiratory distress. He has no wheezes. He has no rhonchi. He has no rales. He exhibits no tenderness and no crepitus.  Abdominal: Soft. Normal appearance. He exhibits no distension. There is no tenderness. There is no rebound and no guarding.  Musculoskeletal: Normal range of motion. He exhibits no edema or  tenderness.  Moves all extremities well.   Neurological: He is alert and oriented to person, place, and time. He has normal strength. No cranial nerve deficit.  Skin: Skin is warm, dry and intact. No rash noted. No erythema. No pallor.  Psychiatric: His speech is delayed. He is slowed. He exhibits a depressed mood. He expresses suicidal ideation.  Flat affect, poor eye contact  Nursing note and vitals reviewed.   ED Course  Procedures   Medications  potassium chloride SA (K-DUR,KLOR-CON) CR tablet 40 mEq (0 mEq Oral Hold 03/13/16 0351)  acetaminophen (TYLENOL) tablet 650 mg (not administered)  ibuprofen (ADVIL,MOTRIN) tablet 600 mg (not administered)  zolpidem (AMBIEN) tablet 10 mg (not administered)  nicotine (NICODERM CQ - dosed in mg/24 hours) patch 21 mg (not administered)  ondansetron (ZOFRAN) tablet 4 mg (not administered)  alum & mag hydroxide-simeth (MAALOX/MYLANTA) 200-200-20 MG/5ML suspension 30 mL (not administered)  benztropine (COGENTIN) tablet 1 mg (not administered)  traZODone (DESYREL) tablet 100 mg (0 mg Oral Hold 03/13/16 0352)    DIAGNOSTIC STUDIES: Oxygen Saturation is 100% on RA, normal by my interpretation.  COORDINATION OF CARE: 12:38 AM-Will order psych labs. Discussed treatment plan with pt at bedside and pt agreed to plan.   1:35 AM patient's labs were reviewed. He was started on oral potassium for his hypokalemia. TSS consult and psych holding orders were written.  TSS states patient meets inpatient admission criteria, they are working on placement.   Labs Review Results for orders placed or performed during the hospital encounter of 03/12/16  Comprehensive metabolic panel  Result Value Ref Range   Sodium 137 135 - 145 mmol/L   Potassium 3.3 (L) 3.5 - 5.1 mmol/L   Chloride 108 101 - 111 mmol/L   CO2 24 22 - 32 mmol/L   Glucose, Bld 113 (H) 65 - 99 mg/dL   BUN 14 6 - 20 mg/dL   Creatinine, Ser 1.02 0.61 - 1.24 mg/dL   Calcium 8.6 (L) 8.9 - 10.3  mg/dL   Total Protein 6.7 6.5 - 8.1 g/dL   Albumin 3.7 3.5 - 5.0 g/dL   AST 18 15 - 41 U/L   ALT 18 17 - 63 U/L   Alkaline Phosphatase 45 38 - 126 U/L   Total Bilirubin 0.4 0.3 - 1.2 mg/dL   GFR calc non Af Amer >60 >60 mL/min   GFR calc Af Amer >60 >60 mL/min   Anion gap 5 5 - 15  CBC with Differential  Result Value Ref Range   WBC 10.0 4.0 - 10.5 K/uL   RBC 4.78 4.22 - 5.81 MIL/uL   Hemoglobin 14.4 13.0 - 17.0 g/dL   HCT 39.9 39.0 - 52.0 %   MCV 83.5 78.0 - 100.0 fL   MCH 30.1 26.0 - 34.0 pg   MCHC 36.1 (H) 30.0 - 36.0 g/dL   RDW 13.7 11.5 - 15.5 %   Platelets  261 150 - 400 K/uL   Neutrophils Relative % 47 %   Neutro Abs 4.6 1.7 - 7.7 K/uL   Lymphocytes Relative 45 %   Lymphs Abs 4.5 (H) 0.7 - 4.0 K/uL   Monocytes Relative 6 %   Monocytes Absolute 0.6 0.1 - 1.0 K/uL   Eosinophils Relative 2 %   Eosinophils Absolute 0.2 0.0 - 0.7 K/uL   Basophils Relative 0 %   Basophils Absolute 0.0 0.0 - 0.1 K/uL  Ethanol  Result Value Ref Range   Alcohol, Ethyl (B) <5 <5 mg/dL  Urine rapid drug screen (hosp performed)  Result Value Ref Range   Opiates NONE DETECTED NONE DETECTED   Cocaine NONE DETECTED NONE DETECTED   Benzodiazepines NONE DETECTED NONE DETECTED   Amphetamines NONE DETECTED NONE DETECTED   Tetrahydrocannabinol POSITIVE (A) NONE DETECTED   Barbiturates NONE DETECTED NONE DETECTED   Laboratory interpretation all normal except +UDS, hypokalemia   I have personally reviewed and evaluated these images and lab results as part of my medical decision-making.    MDM   Final diagnoses:  Depression  Suicidal ideation  Hypokalemia   Disposition pending inpatient admission  Rolland Porter, MD, FACEP   I personally performed the services described in this documentation, which was scribed in my presence. The recorded information has been reviewed and considered.  Rolland Porter, MD, Barbette Or, MD 03/13/16 [phone]

## 2016-03-13 NOTE — Tx Team (Signed)
Initial Interdisciplinary Treatment Plan   PATIENT STRESSORS: Medication change or noncompliance Substance abuse   PATIENT STRENGTHS: Average or above average intelligence Communication skills Supportive family/friends   PROBLEM LIST: Problem List/Patient Goals Date to be addressed Date deferred Reason deferred Estimated date of resolution  Schizophrenia 03/13/2016     Suicidal ideation 03/13/2016                                                DISCHARGE CRITERIA:  Ability to meet basic life and health needs Adequate post-discharge living arrangements Verbal commitment to aftercare and medication compliance  PRELIMINARY DISCHARGE PLAN: Attend aftercare/continuing care group Return to previous living arrangement  PATIENT/FAMIILY INVOLVEMENT: This treatment plan has been presented to and reviewed with the patient, Jeffrey Coleman, and/or family member,   The patient and family have been given the opportunity to ask questions and make suggestions.  Rica Records Justo Hengel 03/13/2016, 2:59 PM

## 2016-03-13 NOTE — ED Notes (Signed)
Pt offered shower. Does not want one at this time.

## 2016-03-13 NOTE — BHH Group Notes (Signed)
[facility] LCSW Group Therapy   03/13/2016  3PM  Type of Therapy: Group Therapy   Participation Level: Did Not Attend. Patient invited to participate but declined.    Saahil Herbster F. Demetrie Borge, MSW, LCSWA, LCAS     

## 2016-03-13 NOTE — BHH Suicide Risk Assessment (Signed)
[place] INPATIENT:  Family/Significant Other Suicide Prevention Education  Suicide Prevention Education:  Education Completed; Mother, Jeffrey Coleman ph #: [phone] has been identified by the patient as the family member/significant other with whom the patient will be residing, and identified as the person(s) who will aid the patient in the event of a mental health crisis (suicidal ideations/suicide attempt).  With written consent from the patient, the family member/significant other has been provided the following suicide prevention education, prior to the and/or following the discharge of the patient. Mother stated there are no access to weapons in home. Pt will return to safe environment at discharge.  The suicide prevention education provided includes the following:  Suicide risk factors  Suicide prevention and interventions  National Suicide Hotline telephone number  [facility] assessment telephone number  [facility] Emergency Assistance [place] and/or Residential Mobile Crisis Unit telephone number  Request made of family/significant other to:  Remove weapons (e.g., guns, rifles, knives), all items previously/currently identified as safety concern.    Remove drugs/medications (over-the-counter, prescriptions, illicit drugs), all items previously/currently identified as a safety concern.  The family member/significant other verbalizes understanding of the suicide prevention education information provided.  The family member/significant other agrees to remove the items of safety concern listed above.  Emilie Rutter, LCSW-A 03/13/2016, 3:13 PM

## 2016-03-13 NOTE — ED Provider Notes (Signed)
Accepted to [facility] to the behavioral health unit by Dr. Dwyane Dee. Chart reviewed, vital signs reviewed, and lab work reviewed. Medically cleared for psychiatric management that is ongoing.  Forde Dandy, MD 03/13/16 [phone]

## 2016-03-13 NOTE — ED Notes (Signed)
Meal given

## 2016-03-13 NOTE — BHH Counselor (Signed)
Per Dr. Jerilee Hoh, [place] has been accepted at [facility] room 322 with admitting Dr. Dwyane Dee, and attending Dr. Jerilee Hoh. Report number is [phone].  Jeffrey Coleman, LPC-A, [facility]  Counselor 03/13/2016 10:32 AM

## 2016-03-13 NOTE — BH Assessment (Addendum)
Tele Assessment Note   Jeffrey Coleman is an 35 y.o. male. Presenting voluntarily reporting symptoms of depression and suicidal ideation. Pt endorses current SI with plans of cutting his wrist. Pt reports history of one suicidal attempt. Pt acknowledges symptoms including feelings of worthlessness and guilt, fatigue, tearfulness, lack of motivation and increased isolation. Pt denies homicidal ideation and any history of violence. Pt reports history of Schizophrenia. Pt reports no current hallucinations and states he has not experienced any in the last two years. Pt reports medication compliance. Pt resides with his mother and identified her as a family support. Pt reports THC use 2x/month.  Diagnosis: F20.0 Schizophrenia, paranoid  Past Medical History:  Past Medical History  Diagnosis Date  . Schizo affective schizophrenia ([place])     History reviewed. No pertinent past surgical history.  Family History:  Family History  Problem Relation Age of Onset  . Cancer Father     Social History:  reports that he has been smoking Cigarettes.  He has a 7.5 pack-year smoking history. He has never used smokeless tobacco. He reports that he drinks alcohol. He reports that he uses illicit drugs.  Additional Social History:  Alcohol / Drug Use Pain Medications: No abuse reported Prescriptions: No abuse reported Over the Counter: No abuse reorted History of alcohol / drug use?: Yes Substance #2 Name of Substance 2: Cannabis 2 - Age of First Use: 35yo 2 - Amount (size/oz): Not Reported 2 - Frequency: 2x a month 2 - Duration: since age of first use 2 - Last Use / Amount: 8 days ago  CIWA: CIWA-Ar BP: 115/69 mmHg Pulse Rate: (!) 56 COWS:    PATIENT STRENGTHS: (choose at least two) Average or above average intelligence Supportive family/friends  Allergies: No Known Allergies  Home Medications:  (Not in a hospital admission)  OB/GYN Status:  No LMP for male patient.  General Assessment  Data Location of Assessment: [facility] TTS Assessment: In system Is this a Tele or Face-to-Face Assessment?: Tele Assessment Is this an Initial Assessment or a Re-assessment for this encounter?: Initial Assessment Marital status: Single Living Arrangements: Parent Can pt return to current living arrangement?: Yes Admission Status: Voluntary Is patient capable of signing voluntary admission?: Yes Referral Source: Self/Family/Friend Insurance type: Medicare     Crisis Care Plan Living Arrangements: Parent Name of Psychiatrist:  [name]) Name of Therapist: [name]  Education Status Is patient currently in school?: No Highest grade of school patient has completed: GED  Risk to self with the past 6 months Suicidal Ideation: Yes-Currently Present Has patient been a risk to self within the past 6 months prior to admission? : Yes Suicidal Intent: Yes-Currently Present Has patient had any suicidal intent within the past 6 months prior to admission? : No Is patient at risk for suicide?: Yes Suicidal Plan?: Yes-Currently Present Has patient had any suicidal plan within the past 6 months prior to admission? : Yes Specify Current Suicidal Plan: Cut wrist Access to Means: Yes Specify Access to Suicidal Means: Access to sharp objects What has been your use of drugs/alcohol within the last 12 months?: Pt reports history of Cannabis use. Pt's chart notes history of cocaine, cannabis and alcohol use Previous Attempts/Gestures: Yes How many times?: 1 Other Self Harm Risks: None Reported Triggers for Past Attempts:  (None Reported) Intentional Self Injurious Behavior: Cutting, Burning Comment - Self Injurious Behavior: Pt reports last self-harm episode to be two years ago Family Suicide History: No Recent stressful life event(s):  Other (Comment) (Pt denies any current stressors) Persecutory voices/beliefs?: No Depression: Yes Depression Symptoms: Feeling worthless/self pity,  Tearfulness, Fatigue, Isolating, Guilt, Loss of interest in usual pleasures Substance abuse history and/or treatment for substance abuse?: Yes Suicide prevention information given to non-admitted patients: Not applicable  Risk to Others within the past 6 months Homicidal Ideation: No Does patient have any lifetime risk of violence toward others beyond the six months prior to admission? : No Thoughts of Harm to Others: No Current Homicidal Intent: No Current Homicidal Plan: No Access to Homicidal Means: No History of harm to others?: No Assessment of Violence: None Noted Does patient have access to weapons?: No Criminal Charges Pending?: No Does patient have a court date: No Is patient on probation?: No  Psychosis Hallucinations: None noted Delusions: None noted  Mental Status Report Appearance/Hygiene: In scrubs Eye Contact: Good Motor Activity: Unremarkable Speech: Logical/coherent Level of Consciousness: Alert Mood: Ambivalent, Depressed Affect: Appropriate to circumstance Anxiety Level: None Thought Processes: Coherent, Relevant Judgement: Unimpaired Orientation: Person, Place, Time, Situation, Appropriate for developmental age Obsessive Compulsive Thoughts/Behaviors: None  Cognitive Functioning Concentration: Normal Memory: Recent Intact, Remote Intact IQ: Average Insight: Fair Impulse Control: Fair Appetite: Fair Weight Loss: 0 Weight Gain: 0 Total Hours of Sleep: 7 Vegetative Symptoms: None  ADLScreening [facility] Assessment Services) Patient's cognitive ability adequate to safely complete daily activities?: Yes Patient able to express need for assistance with ADLs?: Yes Independently performs ADLs?: Yes (appropriate for developmental age)  Prior Inpatient Therapy Prior Inpatient Therapy: Yes (Pt denies, chart reflects multiple admissions) Prior Therapy Dates: Multiple, last admission 9/16 Prior Therapy Facilty/Provider(s): [facility] Reason for Treatment:  Psychosis, SI  Prior Outpatient Therapy Prior Outpatient Therapy: Yes Prior Therapy Dates: Ongoing Prior Therapy Facilty/Provider(s): [facility] Reason for Treatment: Schizophrenia Does patient have an ACCT team?: Yes ([facility]) Does patient have Intensive In-House Services?  : No Does patient have [facility] services? : No Does patient have P4CC services?: No  ADL Screening (condition at time of admission) Patient's cognitive ability adequate to safely complete daily activities?: Yes Is the patient deaf or have difficulty hearing?: No Does the patient have difficulty seeing, even when wearing glasses/contacts?: No Does the patient have difficulty concentrating, remembering, or making decisions?: Yes (Pt reports difficulty making decisions) Patient able to express need for assistance with ADLs?: Yes Does the patient have difficulty dressing or bathing?: No Independently performs ADLs?: Yes (appropriate for developmental age) Does the patient have difficulty walking or climbing stairs?: No Weakness of Legs: None Weakness of Arms/Hands: None  Home Assistive Devices/Equipment Home Assistive Devices/Equipment: None  Therapy Consults (therapy consults require a physician order) PT Evaluation Needed: No OT Evalulation Needed: No SLP Evaluation Needed: No Abuse/Neglect Assessment (Assessment to be complete while patient is alone) Physical Abuse: Denies Verbal Abuse: Denies Sexual Abuse: Denies Exploitation of patient/patient's resources: Denies Self-Neglect: Denies Values / Beliefs Cultural Requests During Hospitalization: None Spiritual Requests During Hospitalization: None Consults Spiritual Care Consult Needed: No Social Work Consult Needed: No Regulatory affairs officer (For Healthcare) Does patient have an advance directive?: No Would patient like information on creating an advanced directive?: No - patient declined information    Additional Information 1:1 In Past 12 Months?:  No CIRT Risk: No Elopement Risk: No Does patient have medical clearance?: No     Disposition: Clinician consulted with [name], PA and pt is recommended for inpatient admission. Clinician confirmed lack of [facility] bed availability with [facility], AC. TTS to seek placement. Ron, RN informed of pt disposition.  Disposition Initial Assessment Completed for this Encounter: Yes Disposition of Patient: Other dispositions Other disposition(s): Other (Comment) (Pending Psychiatric Recommendation)  Aiyanah Kalama J Martinique 03/13/2016 3:35 AM
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
## 2016-03-14 ENCOUNTER — Encounter: Payer: Self-pay | Admitting: Psychiatry

## 2016-03-14 DIAGNOSIS — F1220 Cannabis dependence, uncomplicated: Secondary | ICD-10-CM

## 2016-03-14 DIAGNOSIS — F200 Paranoid schizophrenia: Principal | ICD-10-CM

## 2016-03-14 MED ORDER — BENZTROPINE MESYLATE 1 MG PO TABS
1.0000 mg | ORAL_TABLET | Freq: Every day | ORAL | Status: DC
  Administered 2016-03-14 – 2016-03-15 (×2): 1 mg via ORAL
  Filled 2016-03-14 (×2): qty 1

## 2016-03-14 MED ORDER — TRAZODONE HCL 150 MG PO TABS: 150.0000 mg | ORAL_TABLET | Freq: Every day | ORAL | Status: DC

## 2016-03-14 NOTE — Plan of Care (Signed)
Problem: Education: Goal: Emotional status will improve Outcome: Progressing Patient denies SI, HI, AVH

## 2016-03-14 NOTE — Discharge Summary (Addendum)
Physician Discharge Summary Note  Patient:  Jeffrey Coleman is an 35 y.o., male MRN:  [id number] DOB:  [birth year] Patient phone:  [phone] (home)  Patient address:   [place] [address],  Total Time spent with patient: 1 hour  Date of Admission:  03/13/2016 Date of Discharge: 03/15/16  Reason for Admission:  SI  Principal Problem: Schizophrenia, paranoid [facility]) Discharge Diagnoses: Patient Active Problem List   Diagnosis Date Noted  . Cannabis use disorder, moderate, dependence ([place]) [F12.20] 03/14/2016  . Schizophrenia, paranoid ([place]) [F20.0] 05/31/2015  . Tobacco use disorder [F17.200] 08/22/2014   History of Present Illness:  Jeffrey Coleman is an 35 y.o. Male with history of schizophrenia who is follow-up by [name]. Patient presented voluntarily to [facility] emergency department reporting symptoms of depression and suicidal ideation with plans of cutting his wrist.   Per the referral information patient reported having a history of prior suicidal attempt and having symptoms of depression that included worthlessness, guilt, fatigue, tearfulness, lack of motivation and increased isolation.   Today during assessment the patient tells me that he was feeling very depressed for 3 days in a row prior to him going to the emergency department. He believes that this was triggered by not being able to sleep for 2 weeks in a row. The patient is states that he did not have access to trazodone which he is prescribed with. Patient says that after staying here overnight and taking trazodone again he feels 100% better.  As far as psychosis patient tells me he has been having auditory hallucinations for many years. They usually command him to do random things such as moving the table or closing the door. He says that the hallucinations have decreased in intensity since he has been taking Abilify injectable. Patient says he had been taking Abilify IM for  the last 2 years.  As far as his stressors the patient says that his brother was murdered in May 2016. He says that his mother had gotten emotional as the anniversary just passed. All of these made him start thinking about his brother and he felt guilty. He says that he has been thinking about all the things that he could have done differently.   Associated Signs/Symptoms: Depression Symptoms: depressed mood, insomnia, suicidal thoughts with specific plan, (Hypo) Manic Symptoms: denies Anxiety Symptoms: Excessive Worry, Psychotic Symptoms: Hallucinations: Auditory PTSD Symptoms: NA   Total Time spent with patient: 1 hour  Past Psychiatric History: Patient reports having a multitude of hospitalizations. He said he has been in our facility multiple times in the past. He is currently successfully treated with Abilify 5 400 mg IM every 28 days, Cogentin 1 mg when necessary and trazodone 150 mg daily at bedtime. He is follow-up by [name].  His last admission in our facility was back in September 2016   Past Medical History:  Past Medical History  Diagnosis Date  . Schizo affective schizophrenia ([place])    History reviewed. No pertinent past surgical history. Family History:  Family History  Problem Relation Age of Onset  . Cancer Father    Family Psychiatric History: Denies  Tobacco Screening: He smokes about half a pack a day  Social History: Currently living with his mother in [place] History  Alcohol Use  . Yes    Comment: occasional     History  Drug Use  . Yes    Comment: crack- last Thursday  Social History   Social History  . Marital Status: Single    Spouse Name: N/A  . Number of Children: N/A  . Years of Education: N/A   Social History Main Topics  . Smoking status: Current Every Day Smoker -- 0.50 packs/day for 15 years    Types: Cigarettes  . Smokeless tobacco: Never Used  . Alcohol Use: Yes     Comment:  occasional  . Drug Use: Yes     Comment: crack- last Thursday  . Sexual Activity: Not Asked   Other Topics Concern  . None   Social History Narrative    Hospital Course:    Schizophrenia continue medications as per [facility] act team. Patient will be continued on Abilify 400 mg IM every 28 days. No injectables have been given during this hospitalization.  EPS continue benztropine as needed 1 mg daily  Insomnia patient has been started back on trazodone 150 mg by mouth daily at bedtime  Tobacco use disorder the patient received a nicotine patch 21 mg a day  Cannabis use disorder patient stated he has been smoking marijuana but says is rarely. Patient will be referred to follow-up with [facility] for treatment of psychiatric disorder and substance abuse.  Labs: TSH hemoglobin A1c and lipid panel were checked back in September of last year.  Disposition: Patient will return to his mother's house today  Discharge follow he will continue to follow with [facility] act team.  During the hospitalization the patient was calm, pleasant, and cooperative. He did not display any symptoms that appear to be consistent with psychosis, mania, hypomania. He did not appear to be depressed. He was out of his room, participating in groups, he participated in all activities and had appropriate interactions with peers and staff.  Patient did not require seclusion, restraints or forced medications  Initially the patient denied having any legal charges however upon discharge the patient requested a note as he had missed a court date. I strongly believe that this court is what motivated this hospitalization.  Physical Findings: AIMS: Facial and Oral Movements Muscles of Facial Expression: None, normal Lips and Perioral Area: None, normal Jaw: None, normal Tongue: None, normal,Extremity Movements Upper (arms, wrists, hands, fingers): None, normal Lower (legs, knees, ankles, toes): None,  normal, Trunk Movements Neck, shoulders, hips: None, normal, Overall Severity Severity of abnormal movements (highest score from questions above): None, normal Incapacitation due to abnormal movements: None, normal Patient's awareness of abnormal movements (rate only patient's report): No Awareness, Dental Status Current problems with teeth and/or dentures?: No Does patient usually wear dentures?: No  CIWA:    COWS:     Musculoskeletal: Strength & Muscle Tone: within normal limits Gait & Station: normal Patient leans: N/A  Psychiatric Specialty Exam: Physical Exam  Constitutional: He is oriented to person, place, and time. He appears well-developed and well-nourished.  HENT:  Head: Normocephalic and atraumatic.  Eyes: Conjunctivae and EOM are normal.  Neck: Normal range of motion.  Respiratory: Effort normal.  Neurological: He is alert and oriented to person, place, and time.    Review of Systems  Constitutional: Negative.   HENT: Negative.   Eyes: Negative.   Respiratory: Negative.   Cardiovascular: Negative.   Gastrointestinal: Negative.   Genitourinary: Negative.   Musculoskeletal: Negative.   Skin: Negative.   Neurological: Negative.   Endo/Heme/Allergies: Negative.   Psychiatric/Behavioral: Positive for hallucinations.    Blood pressure 115/77, pulse 88, temperature 98 F (36.7 C), temperature source Oral, resp.  rate 18, height '5\' 4"'$  (1.626 m), weight 73.936 kg (163 lb), SpO2 100 %.Body mass index is 27.97 kg/(m^2).  General Appearance: Well Groomed  Eye Contact:  Good  Speech:  Clear and Coherent  Volume:  Normal  Mood:  Euthymic  Affect:  Appropriate  Thought Process:  Linear and Descriptions of Associations: Intact  Orientation:  Full (Time, Place, and Person)  Thought Content:  Hallucinations: None  Suicidal Thoughts:  No  Homicidal Thoughts:  No  Memory:  Immediate;   Good Recent;   Good Remote;   Good  Judgement:  Fair  Insight:  Fair  Psychomotor  Activity:  Normal  Concentration:  Concentration: Good and Attention Span: Good  Recall:  Good  Fund of Knowledge:  Good  Language:  Good  Akathisia:  No  Handed:    AIMS (if indicated):     Assets:  Communication Skills Physical Health Social Support  ADL's:  Intact  Cognition:  WNL  Sleep:  Number of Hours: 6     Have you used any form of tobacco in the last 30 days? (Cigarettes, Smokeless Tobacco, Cigars, and/or Pipes): Yes  Has this patient used any form of tobacco in the last 30 days? (Cigarettes, Smokeless Tobacco, Cigars, and/or Pipes) Yes, Yes, A prescription for an FDA-approved tobacco cessation medication was offered at discharge and the patient refused  Blood Alcohol level:  Lab Results  Component Value Date   [facility] <5 03/13/2016   ETH <5 97/35/3299    Metabolic Disorder Labs:  Lab Results  Component Value Date   HGBA1C 4.9 05/31/2015   MPG 105 09/15/2014   Lab Results  Component Value Date   PROLACTIN 2.7* 03/14/2016   Lab Results  Component Value Date   CHOL 167 05/31/2015   TRIG 68 05/31/2015   HDL 40* 05/31/2015   CHOLHDL 4.2 05/31/2015   VLDL 14 05/31/2015   LDLCALC 113* 05/31/2015   LDLCALC 115* 09/15/2014   Results for HAIK, MAHONEY (MRN [id number]) as of 03/15/2016 12:33  Ref. Range 03/13/2016 00:55 03/13/2016 01:14 03/14/2016 17:14  Sodium Latest Ref Range: 135-145 mmol/L 137    Potassium Latest Ref Range: 3.5-5.1 mmol/L 3.3 (L)    Chloride Latest Ref Range: 101-111 mmol/L 108    CO2 Latest Ref Range: 22-32 mmol/L 24    BUN Latest Ref Range: 6-20 mg/dL 14    Creatinine Latest Ref Range: 0.61-1.24 mg/dL 1.02    Calcium Latest Ref Range: 8.9-10.3 mg/dL 8.6 (L)    EGFR (Non-African Amer.) Latest Ref Range: >60 mL/min >60    EGFR (African American) Latest Ref Range: >60 mL/min >60    Glucose Latest Ref Range: 65-99 mg/dL 113 (H)    Anion gap Latest Ref Range: 5-15  5    Alkaline Phosphatase Latest Ref Range: 38-126 U/L 45    Albumin Latest  Ref Range: 3.5-5.0 g/dL 3.7    AST Latest Ref Range: 15-41 U/L 18    ALT Latest Ref Range: 17-63 U/L 18    Total Protein Latest Ref Range: 6.5-8.1 g/dL 6.7    Total Bilirubin Latest Ref Range: 0.3-1.2 mg/dL 0.4    WBC Latest Ref Range: 4.0-10.5 K/uL 10.0    RBC Latest Ref Range: 4.22-5.81 MIL/uL 4.78    Hemoglobin Latest Ref Range: 13.0-17.0 g/dL 14.4    HCT Latest Ref Range: 39.0-52.0 % 39.9    MCV Latest Ref Range: 78.0-100.0 fL 83.5    MCH Latest Ref Range: 26.0-34.0 pg 30.1    MCHC  Latest Ref Range: 30.0-36.0 g/dL 36.1 (H)    RDW Latest Ref Range: 11.5-15.5 % 13.7    Platelets Latest Ref Range: 150-400 K/uL 261    Neutrophils Latest Units: % 47    Lymphocytes Latest Units: % 45    Monocytes Relative Latest Units: % 6    Eosinophil Latest Units: % 2    Basophil Latest Units: % 0    NEUT# Latest Ref Range: 1.7-7.7 K/uL 4.6    Lymphocyte # Latest Ref Range: 0.7-4.0 K/uL 4.5 (H)    Monocyte # Latest Ref Range: 0.1-1.0 K/uL 0.6    Eosinophils Absolute Latest Ref Range: 0.0-0.7 K/uL 0.2    Basophils Absolute Latest Ref Range: 0.0-0.1 K/uL 0.0    Prolactin Latest Ref Range: 4.0-15.2 ng/mL   2.7 (L)  Alcohol, Ethyl (B) Latest Ref Range: <5 mg/dL <5    Amphetamines Latest Ref Range: NONE DETECTED   NONE DETECTED   Barbiturates Latest Ref Range: NONE DETECTED   NONE DETECTED   Benzodiazepines Latest Ref Range: NONE DETECTED   NONE DETECTED   Opiates Latest Ref Range: NONE DETECTED   NONE DETECTED   COCAINE Latest Ref Range: NONE DETECTED   NONE DETECTED   Tetrahydrocannabinol Latest Ref Range: NONE DETECTED   POSITIVE (A)    See Psychiatric Specialty Exam and Suicide Risk Assessment completed by Attending Physician prior to discharge.  Discharge destination:  Home  Is patient on multiple antipsychotic therapies at discharge:  No   Has Patient had three or more failed trials of antipsychotic monotherapy by history:  No  Recommended Plan for Multiple Antipsychotic Therapies: NA       Discharge Instructions    Increase activity slowly    Complete by:  As directed             Medication List    TAKE these medications      Indication   ARIPiprazole ER 400 MG Susr  Commonly known as:  ABILIFY MAINTENA  Inject 400 mg into the muscle every 30 (thirty) days.   Indication:  Schizophrenia     benztropine 1 MG tablet  Commonly known as:  COGENTIN  Take 1 mg by mouth daily.  Notes to Patient:  tremors      traZODone 150 MG tablet  Commonly known as:  DESYREL  Take 1 tablet (150 mg total) by mouth at bedtime.  Notes to Patient:  insomnia        Follow-up Information    Follow up with [facility]. Go on 03/15/2016.   Why:  Please arrive to your [facility] appointment for hospital follow-up. Hinton Dyer, the lead for your ACTT plans to meet with you at 3pm.    Contact information:   [address] [place], [place] [address] [phone]      Signed: Hildred Priest, MD 03/15/2016, 12:37 PM

## 2016-03-14 NOTE — Tx Team (Signed)
Interdisciplinary Treatment Plan Update (Adult)         Date: 03/14/2016   Time Reviewed: 10:30 AM   Progress in Treatment: Improving Attending groups: Yes  Participating in groups: Yes  Taking medication as prescribed: Yes  Tolerating medication: Yes  Family/Significant other contact made: Yes, CSW spoke with patient's mother Patient understands diagnosis: Yes  Discussing patient identified problems/goals with staff: Yes  Medical problems stabilized or resolved: Yes  Denies suicidal/homicidal ideation: Yes  Issues/concerns per patient self-inventory: Yes  Other:   New problem(s) identified: N/A   Discharge Plan or Barriers: see below   Reason for Continuation of Hospitalization:   Depression   Anxiety   Medication Stabilization   Comments: N/A   Estimated length of stay: 3-5 days    Patient is a 35 year old male admitted for suicidal ideation. Patient lives in [place], [place]. Patient will benefit from crisis stabilization, medication evaluation, group therapy, and psycho education in addition to case management for discharge planning. Patient and CSW reviewed pt's identified goals and treatment plan. Pt verbalized understanding and agreed to treatment plan.    Review of initial/current patient goals per problem list:  1. Goal(s): Patient will participate in aftercare plan   Met: Yes  Target date: 3-5 days post admission date   As evidenced by: Patient will participate within aftercare plan AEB aftercare provider and housing plan at discharge being identified.  0712/17: Patient will follow-up with S[facility].   2. Goal (s): Patient will exhibit decreased depressive symptoms and suicidal ideations.   Met: Goal progressing  Target date: 3-5 days post admission date   As evidenced by: Patient will utilize self-rating of depression at 3 or below and demonstrate decreased signs of depression or be deemed stable for discharge by MD.   03/14/16:  Pt states that he still has depressive symptoms due to recent death in family. Pt rates depression score at a 7 at this time.  3. Goal(s): Patient will demonstrate decreased signs and symptoms of anxiety.   Met: Goal progressing  Target date: 3-5 days post admission date   As evidenced by: Patient will utilize self-rating of anxiety at 3 or below and demonstrated decreased signs of anxiety, or be deemed stable for discharge by MD   03/14/16: Patient reports anxiety level at a 5 at this time. Believes medications are working properly.   Attendees:  Patient:  Family:  Physician: Dr. Doran Clay, MD    03/14/2016 10:30 AM  Nursing: Polly Cobia , RN      03/14/2016 10:30 AM  Clinical Social Worker: Glorious Peach, [place]  03/14/2016 10:30 AM  Other: Marylou Flesher, [place]   03/14/2016 10:30 AM  Other: Everitt Amber, Recreational Therapist    03/14/2016 10:30 AM  Other:        03/14/2016 10:30 AM  Other:        03/14/2016 10:30 AM

## 2016-03-14 NOTE — Progress Notes (Signed)
Recreation Therapy Notes  Date: 07.12.17 Time: 9:30 am Location: Craft Room  Group Topic: Self-esteem  Goal Area(s) Addresses:  Patient will write at least one positive trait about self. Patient will verbalize benefit of having healthy self-esteem.  Behavioral Response: Intermittently Attentive  Intervention: I Am  Activity: Patients were given a worksheet with the letter I on it and instructed to write as many positive traits about themselves inside the letter.  Education: LRT educated patients on ways they can increase their self-esteem.  Education Outcome: In group clarification offered  Clinical Observations/Feedback: Patient wrote positive traits. Patient did not contribute to group discussion. Patient laid his head on the table for the majority of group.  Leonette Monarch, LRT/CTRS 03/14/2016 10:18 AM

## 2016-03-14 NOTE — Plan of Care (Signed)
Problem: Safety: Goal: Periods of time without injury will increase Outcome: Progressing Patient has remained safe during this shift.

## 2016-03-14 NOTE — [facility] Group Notes (Signed)
[facility] Group Notes:  (Nursing/MHT/Case Management/Adjunct)  Date:  03/14/2016  Time:  6:33 PM  Type of Therapy:  Psychoeducational Skills  Participation Level:  Did Not Attend  Jeffrey Coleman [facility] 03/14/2016, 6:33 PM

## 2016-03-14 NOTE — H&P (Signed)
Psychiatric Admission Assessment Adult  Patient Identification: Jeffrey Coleman MRN:  [id number] Date of Evaluation:  03/14/2016 Chief Complaint:  Schizophrenia Principal Diagnosis: Schizophrenia, paranoid ([place]) Diagnosis:   Patient Active Problem List   Diagnosis Date Noted  . Cannabis use disorder, moderate, dependence ([place]) [F12.20] 03/14/2016  . Schizophrenia, paranoid ([place]) [F20.0] 05/31/2015  . Tobacco use disorder [F17.200] 08/22/2014   History of Present Illness:  Jeffrey Coleman is an 35 y.o. Male with history of schizophrenia who is follow-up by [name] act team. Patient presented voluntarily to [facility] emergency department reporting  symptoms of depression and suicidal ideation with plans of cutting his wrist.   Per the referral information patient reported having a history of prior suicidal attempt and having symptoms of depression that included worthlessness, guilt, fatigue, tearfulness, lack of motivation and increased isolation.   Today during assessment the patient tells me that he was feeling very depressed for 3 days in a row prior to him going to the emergency department. He believes that this was triggered by not being able to sleep for 2 weeks in a row. The patient is states that he did not have access to trazodone which he is prescribed with. Patient says that after staying here overnight and taking trazodone again he feels 100% better.  As far as psychosis patient tells me he has been having auditory hallucinations for many years.  They usually command him to do random things such as moving the table or closing the door. He says that the hallucinations have decreased in intensity since he has been taking Abilify injectable.   Patient says he had been taking Abilify IM for the last 2 years.  As far as his stressors the patient says that his brother was murdered in May 2016. He says that his mother had gotten emotional as the anniversary just passed. All of  these made him start thinking about his brother and he felt guilty. He says that he has been  thinking about all the things that he could have done differently.   Associated Signs/Symptoms: Depression Symptoms:  depressed mood, insomnia, suicidal thoughts with specific plan, (Hypo) Manic Symptoms:  denies Anxiety Symptoms:  Excessive Worry, Psychotic Symptoms:  Hallucinations: Auditory PTSD Symptoms: NA   Total Time spent with patient: 1 hour  Past Psychiatric History: Patient reports having a multitude of hospitalizations. He said he has been in our facility multiple times in the past. He is currently successfully treated with Abilify 5 400 mg IM every 28 days, Cogentin 1 mg when necessary and trazodone 150 mg daily at bedtime. He is follow-up by [name].  His last admission in our facility was back in September 2016  Is the patient at risk to self? No.  Has the patient been a risk to self in the past 6 months? No.  Has the patient been a risk to self within the distant past? No.  Is the patient a risk to others? No.  Has the patient been a risk to others in the past 6 months? No.  Has the patient been a risk to others within the distant past? No.     Past Medical History: Denies Past Medical History  Diagnosis Date  . Schizo affective schizophrenia ([place])    History reviewed. No pertinent past surgical history.  Family History:  Family History  Problem Relation Age of Onset  . Cancer Father    Family Psychiatric  History: Denies  Tobacco Screening: He smokes about  half a pack a day  Social History: Currently living with his mother in [place] History  Alcohol Use  . Yes    Comment: occasional     History  Drug Use  . Yes    Comment: crack- last Thursday    Additional Social History: Are you sexually active?: No What is your sexual orientation?: Straight  Has your sexual activity been affected by drugs, alcohol, medication, or  emotional stress?: No       Allergies:  No Known Allergies   Lab Results:  Results for orders placed or performed during the hospital encounter of 03/12/16 (from the past 48 hour(s))  Comprehensive metabolic panel     Status: Abnormal   Collection Time: 03/13/16 12:55 AM  Result Value Ref Range   Sodium 137 135 - 145 mmol/L   Potassium 3.3 (L) 3.5 - 5.1 mmol/L   Chloride 108 101 - 111 mmol/L   CO2 24 22 - 32 mmol/L   Glucose, Bld 113 (H) 65 - 99 mg/dL   BUN 14 6 - 20 mg/dL   Creatinine, Ser 1.02 0.61 - 1.24 mg/dL   Calcium 8.6 (L) 8.9 - 10.3 mg/dL   Total Protein 6.7 6.5 - 8.1 g/dL   Albumin 3.7 3.5 - 5.0 g/dL   AST 18 15 - 41 U/L   ALT 18 17 - 63 U/L   Alkaline Phosphatase 45 38 - 126 U/L   Total Bilirubin 0.4 0.3 - 1.2 mg/dL   GFR calc non Af Amer >60 >60 mL/min   GFR calc Af Amer >60 >60 mL/min    Comment: (NOTE) The eGFR has been calculated using the CKD EPI equation. This calculation has not been validated in all clinical situations. eGFR's persistently <60 mL/min signify possible Chronic Kidney Disease.    Anion gap 5 5 - 15  CBC with Differential     Status: Abnormal   Collection Time: 03/13/16 12:55 AM  Result Value Ref Range   WBC 10.0 4.0 - 10.5 K/uL   RBC 4.78 4.22 - 5.81 MIL/uL   Hemoglobin 14.4 13.0 - 17.0 g/dL   HCT 39.9 39.0 - 52.0 %   MCV 83.5 78.0 - 100.0 fL   MCH 30.1 26.0 - 34.0 pg   MCHC 36.1 (H) 30.0 - 36.0 g/dL   RDW 13.7 11.5 - 15.5 %   Platelets 261 150 - 400 K/uL   Neutrophils Relative % 47 %   Neutro Abs 4.6 1.7 - 7.7 K/uL   Lymphocytes Relative 45 %   Lymphs Abs 4.5 (H) 0.7 - 4.0 K/uL   Monocytes Relative 6 %   Monocytes Absolute 0.6 0.1 - 1.0 K/uL   Eosinophils Relative 2 %   Eosinophils Absolute 0.2 0.0 - 0.7 K/uL   Basophils Relative 0 %   Basophils Absolute 0.0 0.0 - 0.1 K/uL  Ethanol     Status: None   Collection Time: 03/13/16 12:55 AM  Result Value Ref Range   Alcohol, Ethyl (B) <5 <5 mg/dL    Comment:        LOWEST  DETECTABLE LIMIT FOR SERUM ALCOHOL IS 5 mg/dL FOR MEDICAL PURPOSES ONLY   Urine rapid drug screen (hosp performed)     Status: Abnormal   Collection Time: 03/13/16  1:14 AM  Result Value Ref Range   Opiates NONE DETECTED NONE DETECTED   Cocaine NONE DETECTED NONE DETECTED   Benzodiazepines NONE DETECTED NONE DETECTED   Amphetamines NONE DETECTED NONE DETECTED   Tetrahydrocannabinol  POSITIVE (A) NONE DETECTED   Barbiturates NONE DETECTED NONE DETECTED    Comment:        DRUG SCREEN FOR MEDICAL PURPOSES ONLY.  IF CONFIRMATION IS NEEDED FOR ANY PURPOSE, NOTIFY LAB WITHIN 5 DAYS.        LOWEST DETECTABLE LIMITS FOR URINE DRUG SCREEN Drug Class       Cutoff (ng/mL) Amphetamine      1000 Barbiturate      200 Benzodiazepine   440 Tricyclics       102 Opiates          300 Cocaine          300 THC              50     Blood Alcohol level:  Lab Results  Component Value Date   ETH <5 03/13/2016   ETH <5 72/53/6644    Metabolic Disorder Labs:  Lab Results  Component Value Date   HGBA1C 4.9 05/31/2015   MPG 105 09/15/2014   No results found for: PROLACTIN Lab Results  Component Value Date   CHOL 167 05/31/2015   TRIG 68 05/31/2015   HDL 40* 05/31/2015   CHOLHDL 4.2 05/31/2015   VLDL 14 05/31/2015   LDLCALC 113* 05/31/2015   LDLCALC 115* 09/15/2014    Current Medications: Current Facility-Administered Medications  Medication Dose Route Frequency Provider Last Rate Last Dose  . acetaminophen (TYLENOL) tablet 650 mg  650 mg Oral Q6H PRN Hildred Priest, MD      . alum & mag hydroxide-simeth (MAALOX/MYLANTA) 200-200-20 MG/5ML suspension 30 mL  30 mL Oral Q4H PRN Hildred Priest, MD      . magnesium hydroxide (MILK OF MAGNESIA) suspension 30 mL  30 mL Oral Daily PRN Hildred Priest, MD      . nicotine (NICODERM CQ - dosed in mg/24 hours) patch 21 mg  21 mg Transdermal Daily Hildred Priest, MD   21 mg at 03/13/16 1504  . traZODone  (DESYREL) tablet 150 mg  150 mg Oral QHS Hildred Priest, MD   150 mg at 03/13/16 2213   PTA Medications: Prescriptions prior to admission  Medication Sig Dispense Refill Last Dose  . ARIPiprazole 400 MG SUSR Inject 400 mg into the muscle every 28 (twenty-eight) days. Next dose is due 10/07/2014 (Patient taking differently: Inject 400 mg into the muscle every 28 (twenty-eight) days. ) 1 each 0 Past Month at Unknown time  . benztropine (COGENTIN) 1 MG tablet Take 1 mg by mouth daily.    03/12/2016 at Unknown time  . traZODone (DESYREL) 100 MG tablet Take 150 mg by mouth at bedtime.    Past Month at Unknown time    Musculoskeletal: Strength & Muscle Tone: within normal limits Gait & Station: normal Patient leans: N/A  Psychiatric Specialty Exam: Physical Exam  Constitutional: He is oriented to person, place, and time. He appears well-developed and well-nourished.  HENT:  Head: Normocephalic and atraumatic.  Eyes: Conjunctivae and EOM are normal.  Neck: Normal range of motion.  Respiratory: Effort normal.  Musculoskeletal: Normal range of motion.  Neurological: He is alert and oriented to person, place, and time.    Review of Systems  Constitutional: Negative.   HENT: Negative.   Eyes: Negative.   Respiratory: Negative.   Cardiovascular: Negative.   Gastrointestinal: Negative.   Genitourinary: Negative.   Musculoskeletal: Negative.   Skin: Negative.   Neurological: Negative.   Endo/Heme/Allergies: Negative.   Psychiatric/Behavioral: Positive for hallucinations and substance abuse. Negative  for depression, suicidal ideas and memory loss. The patient is not nervous/anxious and does not have insomnia.     Blood pressure 125/75, pulse 86, temperature 97.7 F (36.5 C), temperature source Oral, resp. rate 18, height _0  (1.626 m), weight 73.936 kg (163 lb), SpO2 100 %.Body mass index is 27.97 kg/(m^2).  General Appearance: Well Groomed  Eye Contact:  Good  Speech:  Clear  and Coherent  Volume:  Normal  Mood:  Euthymic  Affect:  Congruent  Thought Process:  Linear and Descriptions of Associations: Intact  Orientation:  Full (Time, Place, and Person)  Thought Content:  Hallucinations: Auditory but not bothersome  Suicidal Thoughts:  No  Homicidal Thoughts:  No  Memory:  Immediate;   Good Recent;   Good Remote;   Good  Judgement:  Fair  Insight:  Fair  Psychomotor Activity:  Normal  Concentration:  Concentration: Good and Attention Span: Good  Recall:  Good  Fund of Knowledge:  Good  Language:  Good  Akathisia:  No  Handed:    AIMS (if indicated):     Assets:  Armed forces logistics/support/administrative officer Social Support  ADL's:  Intact  Cognition:  WNL  Sleep:  Number of Hours: 8    Treatment Plan Summary:  Schizophrenia continue medications as per [employer] act team.  Patient will be continued on Abilify 400 mg IM every 28 days. No injectables have been given during this hospitalization.  EPS continue benztropine as needed 1 mg daily  Insomnia patient has been started back on trazodone 150 mg by mouth daily at bedtime  Tobacco use disorder the patient will receive a nicotine patch 21 mg a day  Cannabis use disorder patient stated he has been smoking marijuana but says is rarely. Patient will be referred to follow-up with [facility] for treatment of psychiatric disorder and substance abuse.  Diet regular  Precautions every 15 minute checks  Labs I will order and a prolactin level. TSH hemoglobin A1c and lipid panel were checked back in September of last year.  Disposition: Patient will return to his mother's house once stable  Discharge follow he will continue to follow with [facility] act team.   I certify that inpatient services furnished can reasonably be expected to improve the patient's condition.    Hildred Priest, MD 7/12/20173:06 PM

## 2016-03-14 NOTE — BHH Group Notes (Signed)
[place] LCSW Group Therapy   03/14/2016  1pm   Type of Therapy: Group Therapy   Participation Level: Active   Participation Quality: Attentive, Sharing and Supportive   Affect: Depressed and Flat   Cognitive: Alert and Oriented   Insight: Developing/Improving and Engaged   Engagement in Therapy: Developing/Improving and Engaged   Modes of Intervention: Clarification, Confrontation, Discussion, Education, Exploration, Limit-setting, Orientation, Problem-solving, Rapport Building, Art therapist, Socialization and Support   Summary of Progress/Problems: The topic for group today was emotional regulation. This group focused on both positive and negative emotion identification and allowed  group members to process ways to identify feelings, regulate negative emotions, and find healthy ways to manage internal/external emotions. Group members were asked to reflect on a time when their reaction to an emotion led to a negative outcome and explored how alternative responses using emotion regulation would have benefited them. Group members were also asked to discuss a time when emotion regulation was utilized when a negative emotion was experienced. Pt shared at length about how the pt had difficulty regulating the pt's emotions due to the actions of the others who judge the pt unfairly for being mentally ill.  Pt shared the pt plans to educate others about mental illness in the future to open up the lines of communications and ventilate the pt's emotions in an healthy way regarding the pt's diagnosis.  Pt was polite and cooperative with the CSW and other group members and focused and attentive to the topics discussed and the sharing of others.      Alphonse Guild. Tai Skelly, MSW, LCSWA, LCAS

## 2016-03-14 NOTE — BHH Suicide Risk Assessment (Signed)
[facility] Admission Suicide Risk Assessment   Nursing information obtained from:  Patient Demographic factors:  Male Current Mental Status:  Self-harm thoughts Loss Factors:  NA Historical Factors:  Prior suicide attempts Risk Reduction Factors:  Living with another person, especially a relative  Total Time spent with patient: 1 hour Principal Problem: Schizophrenia, paranoid ([place]) Diagnosis:   Patient Active Problem List   Diagnosis Date Noted  . Cannabis use disorder, moderate, dependence ([place]) [F12.20] 03/14/2016  . Schizophrenia, paranoid ([place]) [F20.0] 05/31/2015  . Tobacco use disorder [F17.200] 08/22/2014   Subjective Data:   Continued Clinical Symptoms:  Alcohol Use Disorder Identification Test Final Score (AUDIT): 4 The "Alcohol Use Disorders Identification Test", Guidelines for Use in Primary Care, Second Edition.  World Pharmacologist [facility]). Score between 0-7:  no or low risk or alcohol related problems. Score between 8-15:  moderate risk of alcohol related problems. Score between 16-19:  high risk of alcohol related problems. Score 20 or above:  warrants further diagnostic evaluation for alcohol dependence and treatment.   CLINICAL FACTORS:   Depression:   Insomnia Alcohol/Substance Abuse/Dependencies Schizophrenia:   Command hallucinatons Previous Psychiatric Diagnoses and Treatments    Psychiatric Specialty Exam: Physical Exam  ROS  Blood pressure 125/75, pulse 86, temperature 97.7 F (36.5 C), temperature source Oral, resp. rate 18, height 5\' 4"  (1.626 m), weight 73.936 kg (163 lb), SpO2 100 %.Body mass index is 27.97 kg/(m^2).                                                    Sleep:  Number of Hours: 8      COGNITIVE FEATURES THAT CONTRIBUTE TO RISK:  Closed-mindedness    SUICIDE RISK:   Mild:  Suicidal ideation of limited frequency, intensity, duration, and specificity.  There are no identifiable plans, no associated intent,  mild dysphoria and related symptoms, good self-control (both objective and subjective assessment), few other risk factors, and identifiable protective factors, including available and accessible social support.  PLAN OF CARE: admit to [facility]  I certify that inpatient services furnished can reasonably be expected to improve the patient's condition.   Hildred Priest, MD 03/14/2016, 3:21 PM

## 2016-03-14 NOTE — Progress Notes (Signed)
D: Patient presents with sad, flat affect.  His stressor include his mother being sick and his brother was murdered last year. Denies SI/HI/AVH at this time. Stays in bed most of shift. Med and group compliant. A: Medication given with education. Encouragement provided.  R: Patient was compliant with medication. He has remained calm and cooperative. Safety maintained with 15 min checks.

## 2016-03-14 NOTE — Progress Notes (Signed)
D: Patient appears sad. Seems to have little insight. His stressor include his mother being sick and his brother was murdered last year. Denies SI/HI/AVH at this time.  A: Medication given with education. Encouragement provided.  R: Patient was compliant with medication. He has remained calm and cooperative. Safety maintained with 15 min checks.

## 2016-03-15 LAB — PROLACTIN: PROLACTIN: 2.7 ng/mL — AB (ref 4.0–15.2)

## 2016-03-15 MED ORDER — ARIPIPRAZOLE ER 400 MG IM SUSR
400.0000 mg | INTRAMUSCULAR | Status: AC
Start: 2016-03-15 — End: ?

## 2016-03-15 NOTE — BHH Group Notes (Signed)
[facility] Group Notes:  (Nursing/MHT/Case Management/Adjunct)  Date:  03/15/2016  Time:  3:26 AM  Type of Therapy:  Psychoeducational Skills  Participation Level:  Active  Participation Quality:  Appropriate  Affect:  Anxious  Cognitive:  Appropriate  Insight:  Appropriate and Good  Engagement in Group:  Engaged  Modes of Intervention:  Discussion, Socialization and Support  Summary of Progress/Problems:  Reece Agar 03/15/2016, 3:26 AM

## 2016-03-15 NOTE — Plan of Care (Signed)
Problem: Coping: Goal: Ability to verbalize frustrations and anger appropriately will improve Outcome: Not Progressing Patient not able to verbalize frustrations at this time [place] RN   

## 2016-03-15 NOTE — BHH Suicide Risk Assessment (Signed)
[facility] Discharge Suicide Risk Assessment   Principal Problem: Schizophrenia, paranoid [facility]) Discharge Diagnoses:  Patient Active Problem List   Diagnosis Date Noted  . Cannabis use disorder, moderate, dependence ([place]) [F12.20] 03/14/2016  . Schizophrenia, paranoid ([place]) [F20.0] 05/31/2015  . Tobacco use disorder [F17.200] 08/22/2014     Psychiatric Specialty Exam: ROS  Blood pressure 115/77, pulse 88, temperature 98 F (36.7 C), temperature source Oral, resp. rate 18, height 5\' 4"  (1.626 m), weight 73.936 kg (163 lb), SpO2 100 %.Body mass index is 27.97 kg/(m^2).                                                       Mental Status Per Nursing Assessment::   On Admission:  Self-harm thoughts  Demographic Factors:  Male  Loss Factors: Loss of significant relationship  Historical Factors: Impulsivity  Risk Reduction Factors:   Sense of responsibility to family, Living with another person, especially a relative and Positive social support  Continued Clinical Symptoms:  Alcohol/Substance Abuse/Dependencies Schizophrenia:   Less than 75 years old Previous Psychiatric Diagnoses and Treatments  Cognitive Features That Contribute To Risk:  None    Suicide Risk:  Minimal: No identifiable suicidal ideation.  Patients presenting with no risk factors but with morbid ruminations; may be classified as minimal risk based on the severity of the depressive symptoms  Follow-up Information    Follow up with [facility]. Go on 03/15/2016.   Why:  Please arrive to your [facility] appointment for hospital follow-up. Hinton Dyer, the lead for your ACTT plans to meet with you at 3pm.    Contact information:   [address] [place], [place] [address] [phone]       Hildred Priest, MD 03/15/2016, 9:10 AM

## 2016-03-15 NOTE — Progress Notes (Signed)
Patient discharged home. DC instructions provided and explained. Medications reviewed. Rx given. All questions answered. Denies SI, HI, AVH. Belongings returned. WOrk note given to patient.

## 2016-03-15 NOTE — Progress Notes (Signed)
  [facility] Adult Case Management Discharge Plan :  Will you be returning to the same living situation after discharge:  Yes,  home with mother  At discharge, do you have transportation home?: Yes,  mother will pick pt up Do you have the ability to pay for your medications: Yes,  Medicare  Release of information consent forms completed and in the chart;  Patient's signature needed at discharge.  Patient to Follow up at: Follow-up Information    Follow up with [facility]. Go on 03/15/2016.   Why:  Please arrive to your [facility] appointment for hospital follow-up. Hinton Dyer, the lead for your ACTT plans to meet with you at 3pm.    Contact information:   [address] [place], [place] [address] [phone]      Next level of care provider has access to [place] and Suicide Prevention discussed: Yes,  reviewed SPE with pt and mother  Have you used any form of tobacco in the last 30 days? (Cigarettes, Smokeless Tobacco, Cigars, and/or Pipes): Yes  Has patient been referred to the Quitline?: Patient refused referral  Patient has been referred for addiction treatment: Yes - Pt was referred with his ACTT.  Emilie Rutter, LCSW-A 03/15/2016, 1:29 PM

## 2016-03-15 NOTE — Progress Notes (Signed)
D: Patient is alert and oriented on the unit this shift. Patient attended   in groups today. Patient denies suicidal ideation, homicidal ideation, auditory or visual hallucinations at the present time.  A: Scheduled medications are administered to patient as per MD orders. Emotional support and encouragement are provided. Patient is maintained on q.15 minute safety checks. Patient is informed to notify staff with questions or concerns. R: No adverse medication reactions are noted. Patient is cooperative with medication administration and partial  treatment plan today. Patient is  Anxious and cooperative on the unit at this time. Patient does not  Interact  with others on the unit this shift. Patient contracts for safety at this time. Patient remains safe at this time Anxiety 7/10,depression 2/10.

## 2016-03-15 NOTE — Progress Notes (Signed)
Recreation Therapy Notes  Date: 07.13.17 Time: 1:00 pm Location: Craft Room  Group Topic: Leisure Education  Goal Area(s) Addresses:  Patient will identify activities for each letter of the alphabet. Patient will verbalize ability to integrate positive leisure into life post d/c. Patient will verbalize ability to use leisure as a Technical sales engineer.  Behavioral Response: Attentive, Left early  Intervention: Leisure Alphabet  Activity: Patients were given a Leisure Air traffic controller and instructed to identify a leisure activity for each letter of the alphabet.   Education: LRT educated patients on what they need to participate in leisure.  Education Outcome: Patient left before LRT educated group.  Clinical Observations/Feedback: Patient worked on activity by writing healthy leisure activities. Patient left group at approximately 1:20 pm and did not return to group.  Leonette Monarch, LRT/CTRS 03/15/2016 4:00 PM

## 2016-10-01 ENCOUNTER — Emergency Department (HOSPITAL_COMMUNITY)
Admission: EM | Admit: 2016-10-01 | Discharge: 2016-10-01 | Disposition: A | Discharge status: Home / Self Care | Attending: Emergency Medicine | Admitting: Emergency Medicine

## 2016-10-01 ENCOUNTER — Emergency Department (HOSPITAL_COMMUNITY)

## 2016-10-01 ENCOUNTER — Encounter (HOSPITAL_COMMUNITY): Payer: Self-pay | Admitting: *Deleted

## 2016-10-01 DIAGNOSIS — Z79899 Other long term (current) drug therapy: Secondary | ICD-10-CM | POA: Diagnosis not present

## 2016-10-01 DIAGNOSIS — F17210 Nicotine dependence, cigarettes, uncomplicated: Secondary | ICD-10-CM | POA: Diagnosis not present

## 2016-10-01 DIAGNOSIS — R569 Unspecified convulsions: Secondary | ICD-10-CM

## 2016-10-01 DIAGNOSIS — R4182 Altered mental status, unspecified: Secondary | ICD-10-CM | POA: Diagnosis present

## 2016-10-01 DIAGNOSIS — G40909 Epilepsy, unspecified, not intractable, without status epilepticus: Secondary | ICD-10-CM | POA: Diagnosis not present

## 2016-10-01 LAB — BASIC METABOLIC PANEL
ANION GAP: 6 (ref 5–15)
BUN: 10 mg/dL (ref 6–20)
CALCIUM: 9.4 mg/dL (ref 8.9–10.3)
CO2: 30 mmol/L (ref 22–32)
Chloride: 103 mmol/L (ref 101–111)
Creatinine, Ser: 0.91 mg/dL (ref 0.61–1.24)
GLUCOSE: 86 mg/dL (ref 65–99)
POTASSIUM: 4.1 mmol/L (ref 3.5–5.1)
SODIUM: 139 mmol/L (ref 135–145)

## 2016-10-01 LAB — CBG MONITORING, ED: GLUCOSE-CAPILLARY: 99 mg/dL (ref 65–99)

## 2016-10-01 LAB — CBC WITH DIFFERENTIAL/PLATELET
BASOS ABS: 0 10*3/uL (ref 0.0–0.1)
Basophils Relative: 1 %
EOS PCT: 1 %
Eosinophils Absolute: 0 10*3/uL (ref 0.0–0.7)
HCT: 42.3 % (ref 39.0–52.0)
Hemoglobin: 15.1 g/dL (ref 13.0–17.0)
Lymphocytes Relative: 47 %
Lymphs Abs: 2.7 10*3/uL (ref 0.7–4.0)
MCH: 29.5 pg (ref 26.0–34.0)
MCHC: 35.7 g/dL (ref 30.0–36.0)
MCV: 82.8 fL (ref 78.0–100.0)
MONO ABS: 0.4 10*3/uL (ref 0.1–1.0)
Monocytes Relative: 7 %
Neutro Abs: 2.6 10*3/uL (ref 1.7–7.7)
Neutrophils Relative %: 44 %
PLATELETS: 318 10*3/uL (ref 150–400)
RBC: 5.11 MIL/uL (ref 4.22–5.81)
RDW: 13.2 % (ref 11.5–15.5)
WBC: 5.8 10*3/uL (ref 4.0–10.5)

## 2016-10-01 MED ORDER — LEVETIRACETAM 500 MG PO TABS: 500.0000 mg | ORAL_TABLET | Freq: Two times a day (BID) | ORAL | 1 refills | Status: DC

## 2016-10-01 MED ORDER — LEVETIRACETAM 500 MG PO TABS
1000.0000 mg | ORAL_TABLET | Freq: Once | ORAL | Status: AC
Start: 2016-10-01 — End: 2016-10-01
  Administered 2016-10-01: 1000 mg via ORAL
  Filled 2016-10-01: qty 2

## 2016-10-01 NOTE — ED Notes (Signed)
Pt verbalized understanding of d/c instructions,  Also Officer Donnie Aho aware of d/c instructions and verbalized understanding.  Was told no report needed to be called.

## 2016-10-01 NOTE — ED Provider Notes (Signed)
[facility] DEPT Provider Note   CSN: [id number] Arrival date & time: 10/01/16  [id number]   By signing my name below, I, Hilbert Odor, attest that this documentation has been prepared under the direction and in the presence of Elnora Morrison, MD. Electronically Signed: Hilbert Odor, Scribe. 10/01/16. 10:24 AM. History   Chief Complaint Chief Complaint  Patient presents with  . Altered Mental Status     The history is provided by the patient and the police. No language interpreter was used.   HPI Comments: Jeffrey Coleman is a 36 y.o. male brought in by ambulance, who presents to the Emergency Department after being found unresponsive around 8:30 am this. Per police a similar episode occurred yesterday evening around 5 pm and this morning around 5 am. Patient states that he was using the bathroom this morning and he remembers waking up in the ambulance. His last known seizure was 2 years ago. He denies fever, cough, congestion, nausea, and vomiting. He also denies any head injury and tingling sensation. His last use of alcohol was 2 weeks ago.   Past Medical History:  Diagnosis Date  . Schizo affective schizophrenia [facility])     Patient Active Problem List   Diagnosis Date Noted  . Cannabis use disorder, moderate, dependence ([place]) 03/14/2016  . Schizophrenia, paranoid ([place]) 05/31/2015  . Tobacco use disorder 08/22/2014    History reviewed. No pertinent surgical history.     Home Medications    Prior to Admission medications   Medication Sig Start Date End Date Taking? Authorizing Provider  ARIPiprazole ER (ABILIFY MAINTENA) 400 MG SUSR Inject 400 mg into the muscle every 30 (thirty) days. 03/15/16   Hildred Priest, MD  benztropine (COGENTIN) 1 MG tablet Take 1 mg by mouth daily.     Historical Provider, MD  traZODone (DESYREL) 150 MG tablet Take 1 tablet (150 mg total) by mouth at bedtime. 03/14/16   Hildred Priest, MD    Family History Family  History  Problem Relation Age of Onset  . Cancer Father     Social History Social History  Substance Use Topics  . Smoking status: Current Every Day Smoker    Packs/day: 0.50    Years: 15.00    Types: Cigarettes  . Smokeless tobacco: Never Used  . Alcohol use Yes     Comment: occasional     Allergies   Patient has no known allergies.   Review of Systems Review of Systems  Constitutional: Negative for fever.  HENT: Negative for congestion.   Respiratory: Negative for cough.   Gastrointestinal: Negative for nausea and vomiting.  Neurological: Positive for seizures. Negative for numbness.  All other systems reviewed and are negative.    Physical Exam Updated Vital Signs Ht 5\' 5"  (1.651 m)   Wt 160 lb (72.6 kg)   BMI 26.63 kg/m   Physical Exam  Constitutional: He is oriented to person, place, and time. He appears well-developed and well-nourished.  HENT:  Head: Normocephalic.  Eyes: EOM are normal. Pupils are equal, round, and reactive to light.  Neck: Normal range of motion. Neck supple.  Cardiovascular: Normal rate and regular rhythm.   Pulmonary/Chest: Effort normal and breath sounds normal.  Anterior lung fields clear.   Abdominal: Soft. He exhibits no distension. There is no tenderness.  Musculoskeletal: Normal range of motion.  No midline cervical tenderness.   Neurological: He is alert and oriented to person, place, and time.  Can lift bloth legs with 5+ strength. Sensations are normal. Arms 5 +  strength. Sensation intact. 5+ strength in UE and LE with f/e at major joints. Sensation to palpation intact in UE and LE. CNs 2-12 grossly intact.  EOMFI.  PERRL.   No meningismus.   Visual fields intact to finger testing. No nystagmus   Psychiatric: He has a normal mood and affect.  Nursing note and vitals reviewed.   ED Treatments / Results   COORDINATION OF CARE: 9:52 AM Discussed treatment plan with pt at bedside, which includes starting him on  anti-seizure medication, and pt agreed to plan.  Labs (all labs ordered are listed, but only abnormal results are displayed) Labs Reviewed  CBC WITH DIFFERENTIAL/PLATELET  BASIC METABOLIC PANEL  CBG MONITORING, ED    EKG  EKG Interpretation  Date/Time:  Monday October 01 2016 09:36:04 EST Ventricular Rate:  62 PR Interval:    QRS Duration: 96 QT Interval:  397 QTC Calculation: 404 R Axis:   69 Text Interpretation:  Sinus rhythm Similar to previous Confirmed by Javonne Dorko MD, [place] Gayden [id number]) on 10/01/2016 10:08:49 AM       Radiology No results found.  Procedures Procedures (including critical care time)  Medications Ordered in ED Medications - No data to display   Initial Impression / Assessment and Plan / ED Course  I have reviewed the triage vital signs and the nursing notes.  Pertinent labs & imaging results that were available during my care of the patient were reviewed by me and considered in my medical decision making (see chart for details).   patient presents after 2 episodes of witnessed brief seizure activity. Patient had a seizure 2 years ago. No seizure activity in the ER. EKG unremarkable. Patient observed and discussed with neurology who will follow-up the patient after release from prison in approximately 2 months. Neurology recommended Keppra twice a day.  Results and differential diagnosis were discussed with the patient/parent/guardian. Xrays were independently reviewed by myself.  Close follow up outpatient was discussed, comfortable with the plan.   Medications  levETIRAcetam (KEPPRA) tablet 1,000 mg (not administered)    Vitals:   10/01/16 0934  Weight: 160 lb (72.6 kg)  Height: 5\' 5"  (1.651 m)    Final diagnoses:  Seizure ([place])     Final Clinical Impressions(s) / ED Diagnoses   Final diagnoses:  Seizure [facility])    New Prescriptions New Prescriptions   No medications on file     Elnora Morrison, MD 10/01/16 1218

## 2016-10-01 NOTE — Discharge Instructions (Signed)
Follow up with neurology. Take seizure medicines as directed.  If you were given medicines take as directed.  If you are on coumadin or contraceptives realize their levels and effectiveness is altered by many different medicines.  If you have any reaction (rash, tongues swelling, other) to the medicines stop taking and see a physician.    If your blood pressure was elevated in the ER make sure you follow up for management with a primary doctor or return for chest pain, shortness of breath or stroke symptoms.  Please follow up as directed and return to the ER or see a physician for new or worsening symptoms.  Thank you. Vitals:   10/01/16 0934  Weight: 160 lb (72.6 kg)  Height: 5\' 5"  (1.651 m)
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
## 2016-10-01 NOTE — ED Notes (Signed)
ED Provider at bedside. 

## 2016-10-01 NOTE — ED Notes (Signed)
Pt now alert with spontaneous eye opening. Pt states he has hx of seizures. Last one was around two years ago.

## 2017-01-20 ENCOUNTER — Emergency Department
Admission: EM | Admit: 2017-01-20 | Discharge: 2017-01-20 | Attending: Emergency Medicine | Admitting: Emergency Medicine

## 2017-01-20 DIAGNOSIS — Z139 Encounter for screening, unspecified: Secondary | ICD-10-CM

## 2017-01-20 DIAGNOSIS — F1220 Cannabis dependence, uncomplicated: Secondary | ICD-10-CM | POA: Diagnosis not present

## 2017-01-20 DIAGNOSIS — F17210 Nicotine dependence, cigarettes, uncomplicated: Secondary | ICD-10-CM | POA: Diagnosis not present

## 2017-01-20 DIAGNOSIS — Z0000 Encounter for general adult medical examination without abnormal findings: Secondary | ICD-10-CM | POA: Insufficient documentation

## 2017-01-20 DIAGNOSIS — F259 Schizoaffective disorder, unspecified: Secondary | ICD-10-CM | POA: Insufficient documentation
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
## 2018-01-08 IMAGING — CT CT HEAD W/O CM
3 series · 15 of 47 positions shown, 18 images · non-contrast
Comparison: 02/25/2009

CLINICAL DATA: Unresponsive.  Found under bed.

EXAM:
CT HEAD WITHOUT CONTRAST
TECHNIQUE: Contiguous axial images were obtained from the base of the skull
through the vertex without intravenous contrast.

[Series 2: head trauma wo · axial · 0.41mm/px · z∈[+236,+361]mm · 9 of 30 slices shown, 12 images]
[im 3/30  brain]
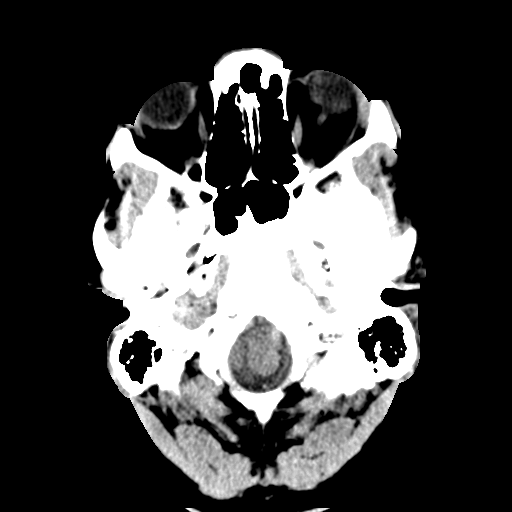
[im 3/30  bone]
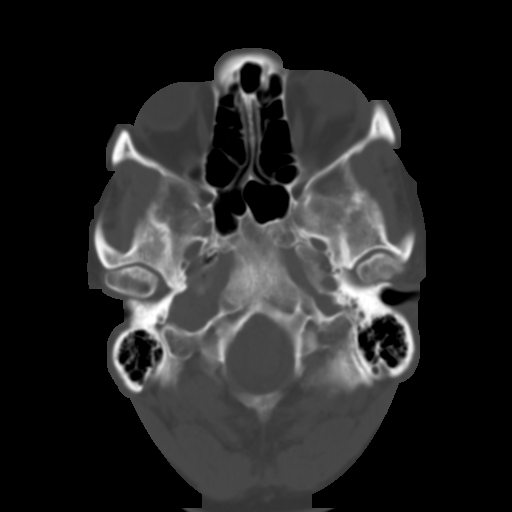
[im 6/30  brain]
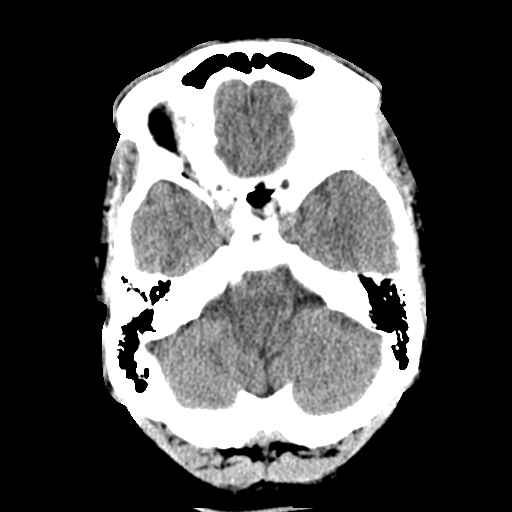
[im 9/30  brain]
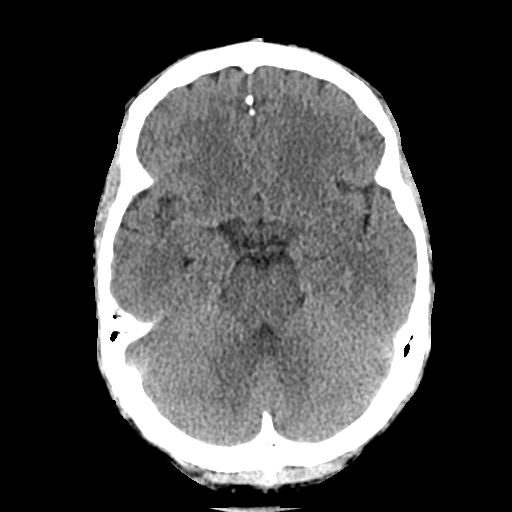
[im 12/30  brain]
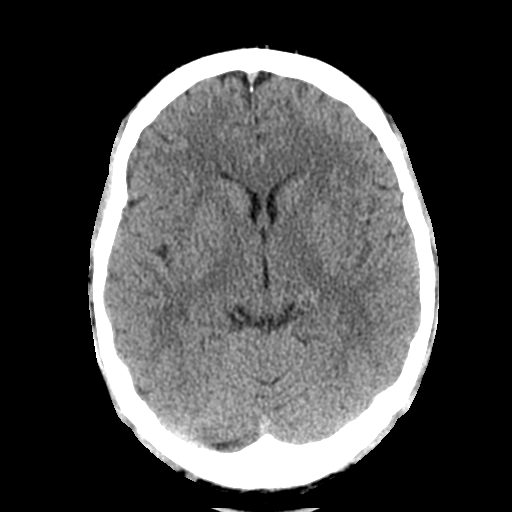
[im 16/30  brain]
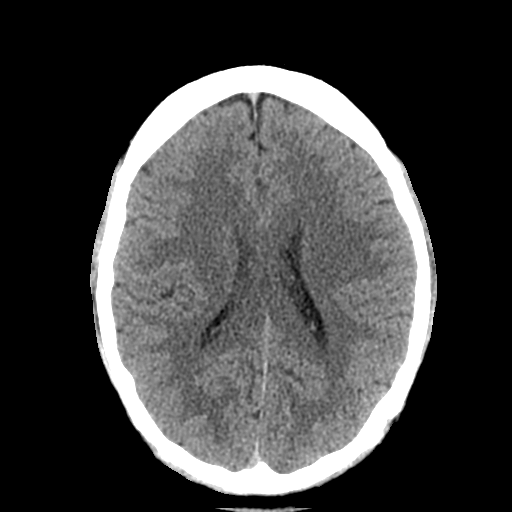
[im 16/30  bone]
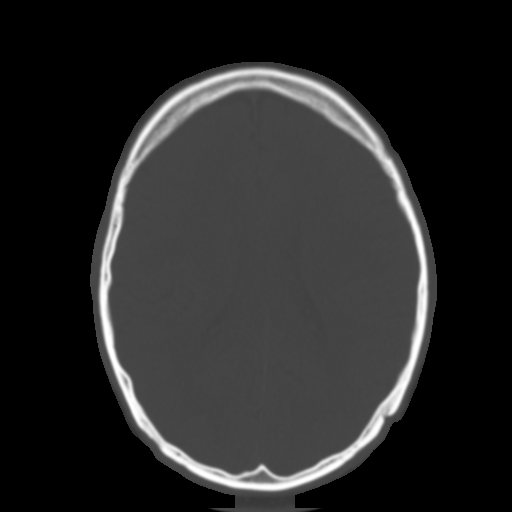
[im 19/30  brain]
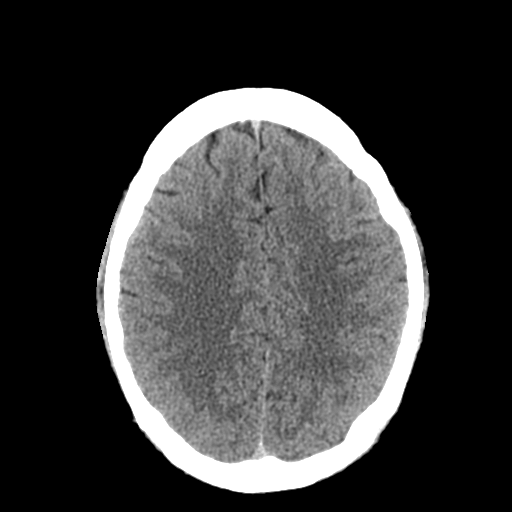
[im 22/30  brain]
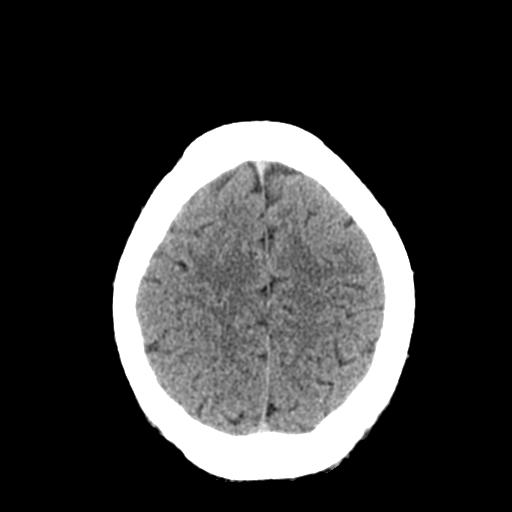
[im 25/30  brain]
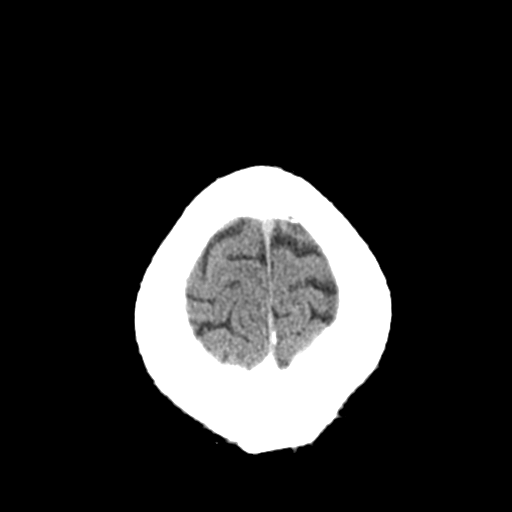
[im 28/30  brain]
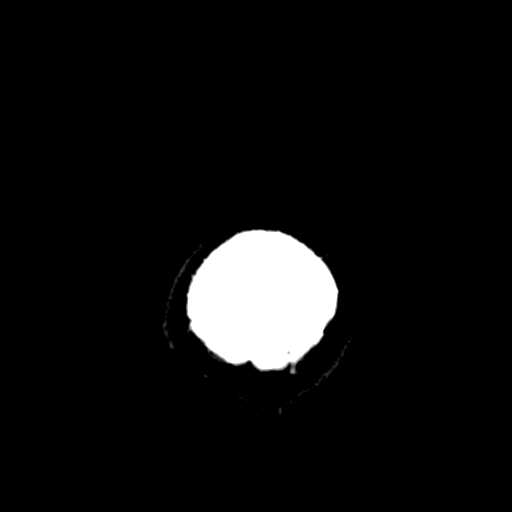
[im 28/30  bone]
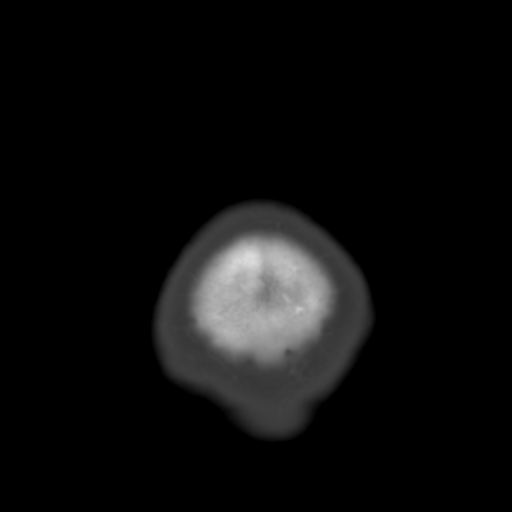

[Series 4: coronal soft tissue · coronal · 0.30mm/px · 3 of 76 slices shown]
[im 26/76  brain]
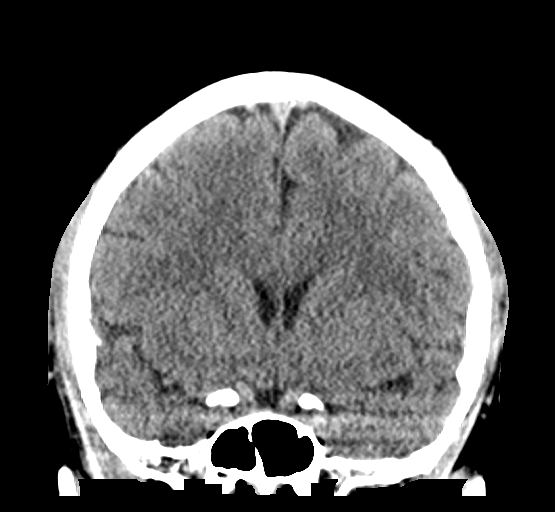
[im 34/76  brain]
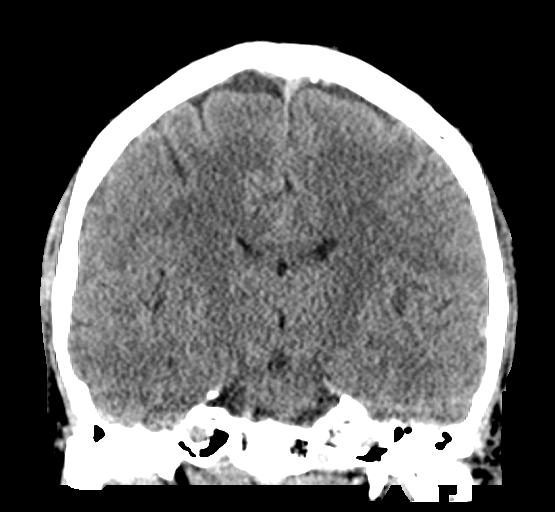
[im 42/76  brain]
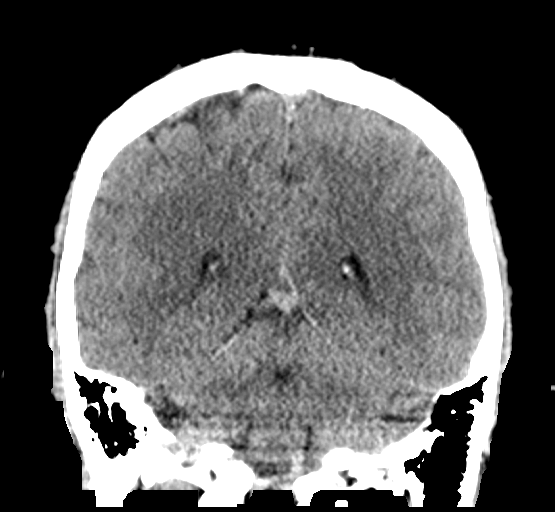

[Series 5: sagittal soft tissue · sagittal · 0.31mm/px · 3 of 56 slices shown]
[im 19/56  brain]
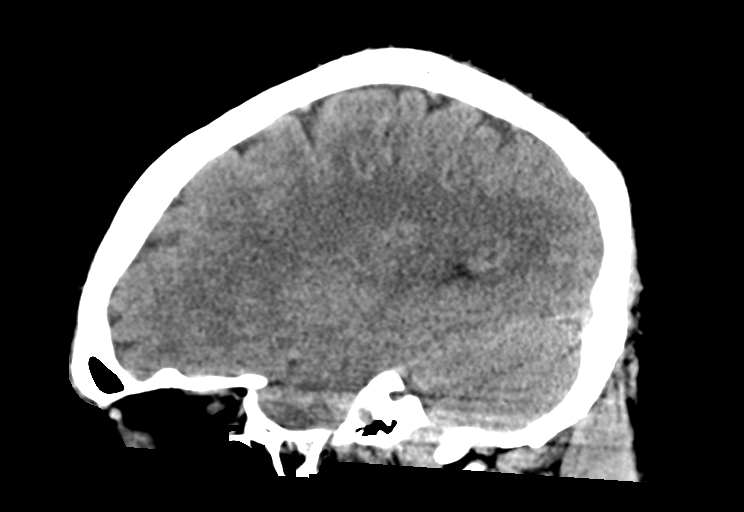
[im 28/56  brain]
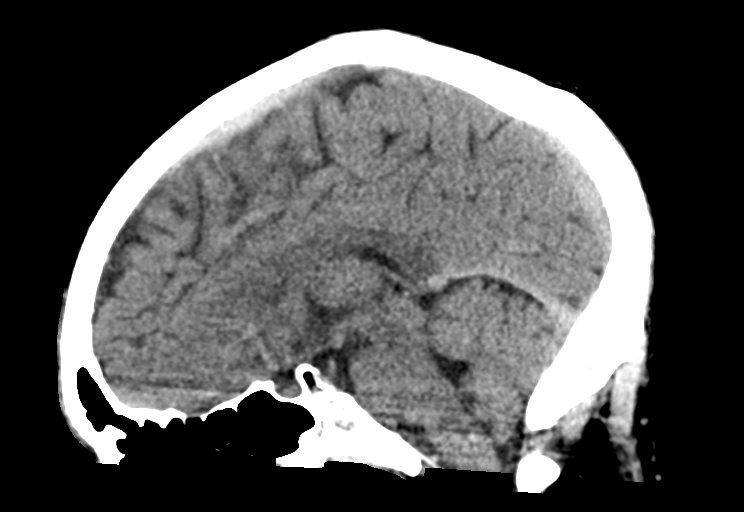
[im 37/56  brain]
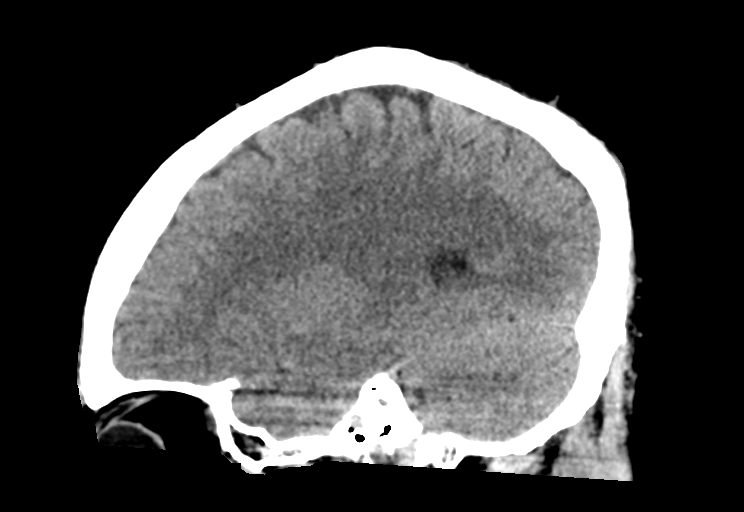

[15 of 47 positions shown; findings below may reference images not displayed]

FINDINGS: Brain: No evidence of acute infarction, hemorrhage, hydrocephalus,
extra-axial collection or mass lesion/mass effect.

Vascular: No hyperdense vessel or unexpected calcification.

Skull: Normal. Negative for fracture or focal lesion.

Sinuses/Orbits: No acute finding.

Other: None.
IMPRESSION: 1. Normal brain.

## 2019-04-20 ENCOUNTER — Encounter (HOSPITAL_COMMUNITY): Payer: Self-pay | Admitting: *Deleted

## 2019-04-20 ENCOUNTER — Inpatient Hospital Stay (HOSPITAL_COMMUNITY)
Admission: AD | Admit: 2019-04-20 | Discharge: 2019-04-23 | DRG: 885 | Disposition: A | Discharge status: Home / Self Care | Payer: Medicare Other | Source: Intra-hospital | Attending: Psychiatry | Admitting: Psychiatry

## 2019-04-20 ENCOUNTER — Emergency Department (HOSPITAL_COMMUNITY)
Admission: EM | Admit: 2019-04-20 | Discharge: 2019-04-20 | Disposition: A | Discharge status: Other Acute Inpatient Hospital | Payer: Medicaid Other | Attending: Emergency Medicine | Admitting: Emergency Medicine

## 2019-04-20 ENCOUNTER — Encounter (HOSPITAL_COMMUNITY): Payer: Self-pay | Admitting: Emergency Medicine

## 2019-04-20 ENCOUNTER — Other Ambulatory Visit: Payer: Self-pay

## 2019-04-20 DIAGNOSIS — R45850 Homicidal ideations: Secondary | ICD-10-CM

## 2019-04-20 DIAGNOSIS — Z809 Family history of malignant neoplasm, unspecified: Secondary | ICD-10-CM

## 2019-04-20 DIAGNOSIS — F209 Schizophrenia, unspecified: Secondary | ICD-10-CM | POA: Diagnosis present

## 2019-04-20 DIAGNOSIS — Z9119 Patient's noncompliance with other medical treatment and regimen: Secondary | ICD-10-CM

## 2019-04-20 DIAGNOSIS — F17210 Nicotine dependence, cigarettes, uncomplicated: Secondary | ICD-10-CM | POA: Insufficient documentation

## 2019-04-20 DIAGNOSIS — F200 Paranoid schizophrenia: Secondary | ICD-10-CM | POA: Diagnosis present

## 2019-04-20 DIAGNOSIS — Z20828 Contact with and (suspected) exposure to other viral communicable diseases: Secondary | ICD-10-CM | POA: Insufficient documentation

## 2019-04-20 DIAGNOSIS — F1410 Cocaine abuse, uncomplicated: Secondary | ICD-10-CM | POA: Diagnosis present

## 2019-04-20 DIAGNOSIS — F1210 Cannabis abuse, uncomplicated: Secondary | ICD-10-CM | POA: Diagnosis present

## 2019-04-20 DIAGNOSIS — R443 Hallucinations, unspecified: Secondary | ICD-10-CM | POA: Diagnosis present

## 2019-04-20 DIAGNOSIS — R45851 Suicidal ideations: Secondary | ICD-10-CM | POA: Diagnosis not present

## 2019-04-20 HISTORY — DX: Anxiety disorder, unspecified: F41.9

## 2019-04-20 HISTORY — DX: Depression, unspecified: F32.A

## 2019-04-20 LAB — CBC WITH DIFFERENTIAL/PLATELET
Abs Immature Granulocytes: 0.04 10*3/uL (ref 0.00–0.07)
Basophils Absolute: 0 10*3/uL (ref 0.0–0.1)
Basophils Relative: 0 %
Eosinophils Absolute: 0 10*3/uL (ref 0.0–0.5)
Eosinophils Relative: 0 %
HCT: 42.5 % (ref 39.0–52.0)
Hemoglobin: 14.7 g/dL (ref 13.0–17.0)
Immature Granulocytes: 0 %
Lymphocytes Relative: 28 %
Lymphs Abs: 3.2 10*3/uL (ref 0.7–4.0)
MCH: 29.8 pg (ref 26.0–34.0)
MCHC: 34.6 g/dL (ref 30.0–36.0)
MCV: 86 fL (ref 80.0–100.0)
Monocytes Absolute: 0.7 10*3/uL (ref 0.1–1.0)
Monocytes Relative: 6 %
Neutro Abs: 7.5 10*3/uL (ref 1.7–7.7)
Neutrophils Relative %: 66 %
Platelets: 338 10*3/uL (ref 150–400)
RBC: 4.94 MIL/uL (ref 4.22–5.81)
RDW: 14.6 % (ref 11.5–15.5)
WBC: 11.5 10*3/uL — ABNORMAL HIGH (ref 4.0–10.5)
nRBC: 0 % (ref 0.0–0.2)

## 2019-04-20 LAB — SARS CORONAVIRUS 2 BY RT PCR (HOSPITAL ORDER, PERFORMED IN ~~LOC~~ HOSPITAL LAB): SARS Coronavirus 2: NEGATIVE

## 2019-04-20 LAB — COMPREHENSIVE METABOLIC PANEL
ALT: 16 U/L (ref 0–44)
AST: 17 U/L (ref 15–41)
Albumin: 4.2 g/dL (ref 3.5–5.0)
Alkaline Phosphatase: 44 U/L (ref 38–126)
Anion gap: 6 (ref 5–15)
BUN: 9 mg/dL (ref 6–20)
CO2: 27 mmol/L (ref 22–32)
Calcium: 8.9 mg/dL (ref 8.9–10.3)
Chloride: 107 mmol/L (ref 98–111)
Creatinine, Ser: 0.72 mg/dL (ref 0.61–1.24)
GFR calc Af Amer: >60 mL/min (ref >60)
GFR calc non Af Amer: >60 mL/min (ref >60)
Glucose, Bld: 81 mg/dL (ref 70–99)
Potassium: 3.9 mmol/L (ref 3.5–5.1)
Sodium: 140 mmol/L (ref 135–145)
Total Bilirubin: 0.8 mg/dL (ref 0.3–1.2)
Total Protein: 7.4 g/dL (ref 6.5–8.1)

## 2019-04-20 LAB — SALICYLATE LEVEL: Salicylate Lvl: <7 mg/dL (ref 2.8–30.0)

## 2019-04-20 LAB — RAPID URINE DRUG SCREEN, HOSP PERFORMED
Amphetamines: NOT DETECTED
Barbiturates: NOT DETECTED
Benzodiazepines: NOT DETECTED
Cocaine: POSITIVE — AB
Opiates: NOT DETECTED
Tetrahydrocannabinol: NOT DETECTED

## 2019-04-20 LAB — ACETAMINOPHEN LEVEL: Acetaminophen (Tylenol), Serum: <10 ug/mL — ABNORMAL LOW (ref 10–30)

## 2019-04-20 LAB — ETHANOL: Alcohol, Ethyl (B): 21 mg/dL — ABNORMAL HIGH (ref <10)

## 2019-04-20 MED ORDER — TRAZODONE HCL 50 MG PO TABS: 150.0000 mg | ORAL_TABLET | Freq: Every day | ORAL | Status: DC

## 2019-04-20 MED ORDER — TRAZODONE HCL 50 MG PO TABS
50.0000 mg | ORAL_TABLET | Freq: Every evening | ORAL | Status: DC | PRN
  Filled 2019-04-20: qty 1
  Filled 2019-04-20: qty 7

## 2019-04-20 MED ORDER — MAGNESIUM HYDROXIDE 400 MG/5ML PO SUSP: 30.0000 mL | Freq: Every day | ORAL | Status: DC | PRN

## 2019-04-20 MED ORDER — BENZTROPINE MESYLATE 1 MG PO TABS
1.0000 mg | ORAL_TABLET | Freq: Every day | ORAL | Status: DC
  Administered 2019-04-20: 1 mg via ORAL
  Filled 2019-04-20: qty 1

## 2019-04-20 MED ORDER — ACETAMINOPHEN 325 MG PO TABS
650.0000 mg | ORAL_TABLET | Freq: Four times a day (QID) | ORAL | Status: DC | PRN
  Administered 2019-04-22: 650 mg via ORAL
  Filled 2019-04-20: qty 2

## 2019-04-20 MED ORDER — ALUM & MAG HYDROXIDE-SIMETH 200-200-20 MG/5ML PO SUSP: 30.0000 mL | ORAL | Status: DC | PRN

## 2019-04-20 NOTE — ED Notes (Signed)
Pt.s lunch has arrived.  

## 2019-04-20 NOTE — Progress Notes (Signed)
Adult Psychoeducational Group Note  Date:  04/20/2019 Time:  9:58 PM  Group Topic/Focus:  Wrap-Up Group:   The focus of this group is to help patients review their daily goal of treatment and discuss progress on daily workbooks.  Participation Level:  Did Not Attend  Participation Quality:  Did not attend  Affect:  Did not attend  Cognitive:  Did not attend  Insight: None  Engagement in Group:  Did not attend  Modes of Intervention:  Did not attend  Additional Comments:  Pt did not attend evening wrap up group tonight.  Candy Sledge 04/20/2019, 9:58 PM

## 2019-04-20 NOTE — Progress Notes (Signed)
This patient continues to meet inpatient criteria. CSW faxed information to the following facilities:   [facility]- At capacity, no adult beds [facility]- Available beds, will review [place], [place] Social Worker [phone]

## 2019-04-20 NOTE — Progress Notes (Signed)
Pt admitted voluntarily to [facility] inpatient unit.  Pt reported that he has been experiencing AVH for approximately 6 months.  Pt stated he sees shadows and the voices are command in nature, telling him to hurt himself and others.  Pt state he was recently released from prison (~2 months ago).  He stated he stopped taking his medication while incarcerated.  Pt reported a court date of 9/18.  Fifteen minute checks initiated for patient safety.  Pt safe on unit.

## 2019-04-20 NOTE — BHH Counselor (Signed)
Patient has been accepted to [facility] 500-2 PENDING COVID TEST. Patient may arrive at anytime after Covid test results. Accepting Provider: Ricky Ala, NP Attending MD: Dr. Mallie Darting Call report to [phone].

## 2019-04-20 NOTE — ED Triage Notes (Signed)
Pt has hx of schizophrenia and anxiety. Pt states that he stopped taking his medications x 6 months ago because of the side effects. States he hears voices " to kill a lot of people and to be killed by cops." Pt states he wants to have death by cop

## 2019-04-20 NOTE — Progress Notes (Addendum)
D: Pt been in room majority of the evening. Pt passive SI-contracts for safety.  A: Pt was offered support and encouragement.Pt was encourage to attend groups. Q 15 minute checks were done for safety.  R: safety maintained on unit.

## 2019-04-20 NOTE — Progress Notes (Signed)
This patient has been accepted to [facility] for inpatient admission.  Patient may arrive after 2:00pm. Patient is voluntary and will require [place] transportation.  RN Call for Report: [phone]  Accepting Provider: Dr.Farah  Bed: [place], [place] Social Worker [phone]

## 2019-04-20 NOTE — BH Assessment (Addendum)
Tele Assessment Note   Patient Name: Jeffrey Coleman MRN: [id number] Referring Physician: Dr. Ezequiel Essex, MD Location of Patient: [facility] Location of Provider: [place] is a 38 y.o. male who shares he has been experiencing AH the last 3 days telling him to kill as many people as he can until the police come and then charge the police, probably with a knife, so they kill him. Pt states he was doing really well while he was taking his medication but states that, while incarcerated, he asked to go off of his medication because it was making him throw up. He states that, once released, he thought he was doing well so decided to continue to stay off of the medication but that, over the last 4 months since his release, things have continued to get worse and worse. Pt states his medication had been prescribed by his [facility] in [place].  Pt shares he has attempted to kill himself on three occassions; the last incident was 3 years ago when he attempted to hange himself. Pt estimates he's been hospitalized 15 times and that the last time was approximately 1 year ago. Pt shares he's experienced HI in the past but states he's never acted on it. Pt states he has not engaged in NSSIB for approximately 2 years when he cut himself to determine if a knife was sharp enough to cut his roommate's throat; he began cutting and burning himself at that time. Pt denies access to guns/weapons. He states he has a court date in September. He states he smokes marijuana daily and that he smoked crack today for the first time in over 1 year. Pt verifies he's been experiencing symptoms of depression, including feelings of worthlessness, guilt, and irritability, and that he has been isolating.  Pt provided clinician verbal consent to contact his mother, Jeffrey Coleman, but stated she needed to be contacted after 1100 because the phone was given to his grandmother until  that time in case she needs to call for an emergency. A note with the contact information will be written.  Pt is oriented x4. His recent and remote memory is intact. Pt was cooperative throughout the assessment process. Pt's insight, judgement, and impulse control is impaired at this time.   Diagnosis: F20.9, Schizophrenia   Past Medical History:  Past Medical History:  Diagnosis Date  . Anxiety   . Schizo affective schizophrenia ([place])     History reviewed. No pertinent surgical history.  Family History:  Family History  Problem Relation Age of Onset  . Cancer Father     Social History:  reports that he has been smoking cigarettes. He has a 15.00 pack-year smoking history. He has never used smokeless tobacco. He reports current alcohol use. He reports current drug use.  Additional Social History:  Alcohol / Drug Use Pain Medications: Please see MAR Prescriptions: Please see MAR Over the Counter: Please see MAR History of alcohol / drug use?: Yes Longest period of sobriety (when/how long): 8 months Substance #1 Name of Substance 1: Crack 1 - Age of First Use: Unknown 1 - Amount (size/oz): $40 1 - Frequency: Infrequent 1 - Duration: Hadn't used in 1 year 1 - Last Use / Amount: Today Substance #2 Name of Substance 2: Marijuana 2 - Age of First Use: Unknown 2 - Amount (size/oz): 1g 2 - Frequency: Daily 2 - Duration: 4 months 2 - Last Use / Amount: Today  CIWA: CIWA-Ar BP: Marland Kitchen)  129/92 Pulse Rate: 76 COWS:    Allergies: No Known Allergies  Home Medications: (Not in a hospital admission)   OB/GYN Status:  No LMP for male patient.  General Assessment Data Location of Assessment: [facility] TTS Assessment: In system Is this a Tele or Face-to-Face Assessment?: Tele Assessment Is this an Initial Assessment or a Re-assessment for this encounter?: Initial Assessment Patient Accompanied by:: N/A Language Other than English: No Living Arrangements: Other (Comment)(Pt  lives with his mother) What gender do you identify as?: Male Marital status: Single Maiden name: Zurn Pregnancy Status: No Living Arrangements: Parent Can pt return to current living arrangement?: Yes Admission Status: Voluntary Is patient capable of signing voluntary admission?: Yes Referral Source: Self/Family/Friend Insurance type: None     Crisis Care Plan Living Arrangements: Parent Legal Guardian: Other:(Self) Name of Psychiatrist: None Name of Therapist: None  Education Status Is patient currently in school?: No Is the patient employed, unemployed or receiving disability?: Unemployed  Risk to self with the past 6 months Suicidal Ideation: Yes-Currently Present Has patient been a risk to self within the past 6 months prior to admission? : No Suicidal Intent: Yes-Currently Present Has patient had any suicidal intent within the past 6 months prior to admission? : No Is patient at risk for suicide?: Yes Suicidal Plan?: Yes-Currently Present Has patient had any suicidal plan within the past 6 months prior to admission? : Yes Specify Current Suicidal Plan: Pt plans to kill others and, when police arrive, rush police so they kill him Access to Means: Yes Specify Access to Suicidal Means: Pt can attempt to assault police so he's killed by them What has been your use of drugs/alcohol within the last 12 months?: Pt acknowledges crack and marijuana use Previous Attempts/Gestures: Yes How many times?: 3 Other Self Harm Risks: SA, off of medication Triggers for Past Attempts: Hallucinations Intentional Self Injurious Behavior: Cutting, Burning(None in the last 3 years) Comment - Self Injurious Behavior: Pt has not harmed self in the last 3 years; has cut & burned himself Family Suicide History: Unknown Recent stressful life event(s): Other (Comment)(Pt is off of his medication & is experiencing AH) Persecutory voices/beliefs?: Yes Depression: Yes Depression Symptoms:  Isolating, Guilt, Feeling worthless/self pity, Feeling angry/irritable Substance abuse history and/or treatment for substance abuse?: Yes Suicide prevention information given to non-admitted patients: Not applicable  Risk to Others within the past 6 months Homicidal Ideation: Yes-Currently Present Does patient have any lifetime risk of violence toward others beyond the six months prior to admission? : Yes (comment) Thoughts of Harm to Others: Yes-Currently Present Comment - Thoughts of Harm to Others: Pt has a plan to kill others so he can be killed by police Current Homicidal Intent: Yes-Currently Present Current Homicidal Plan: Yes-Currently Present Describe Current Homicidal Plan: Pt has a plan to kill as many people as he can with a knife until the police come; he will then approach the police with a knife so they will kill him Access to Homicidal Means: Yes Describe Access to Homicidal Means: Pt can obtain access to a knife Identified Victim: No History of harm to others?: Yes Assessment of Violence: None Noted Violent Behavior Description: Pt noted a plan to kill his roommate in the past Does patient have access to weapons?: No(Pt denies access to guns/weapons) Criminal Charges Pending?: No Does patient have a court date: Yes Court Date: 05/05/19 Is patient on probation?: No  Psychosis Hallucinations: Auditory Delusions: None noted  Mental Status Report Appearance/Hygiene: In scrubs  Eye Contact: Good Motor Activity: Unremarkable Speech: Logical/coherent Level of Consciousness: Alert Mood: Anxious Affect: Appropriate to circumstance Anxiety Level: Minimal Thought Processes: Coherent, Relevant Judgement: Impaired Orientation: Person, Place, Situation, Time Obsessive Compulsive Thoughts/Behaviors: Moderate  Cognitive Functioning Concentration: Normal Memory: Recent Intact, Remote Intact Is patient IDD: No Insight: Poor Impulse Control: Fair Appetite: Good Have you  had any weight changes? : No Change Sleep: No Change Total Hours of Sleep: 7 Vegetative Symptoms: None  ADLScreening [facility] Assessment Services) Patient's cognitive ability adequate to safely complete daily activities?: Yes Patient able to express need for assistance with ADLs?: Yes Independently performs ADLs?: Yes (appropriate for developmental age)  Prior Inpatient Therapy Prior Inpatient Therapy: Yes Prior Therapy Dates: Multiple Prior Therapy Facilty/Provider(s): [place], [facility] Reason for Treatment: HI, SI  Prior Outpatient Therapy Prior Outpatient Therapy: Yes Prior Therapy Dates: 2019 Prior Therapy Facilty/Provider(s): [name], [place] Team services Reason for Treatment: Schizophrenia Does patient have an ACCT team?: No Does patient have Intensive In-House Services?  : No Does patient have [facility] services? : No Does patient have P4CC services?: No  ADL Screening (condition at time of admission) Patient's cognitive ability adequate to safely complete daily activities?: Yes Is the patient deaf or have difficulty hearing?: No Does the patient have difficulty seeing, even when wearing glasses/contacts?: No Does the patient have difficulty concentrating, remembering, or making decisions?: Yes Patient able to express need for assistance with ADLs?: Yes Does the patient have difficulty dressing or bathing?: No Independently performs ADLs?: Yes (appropriate for developmental age) Does the patient have difficulty walking or climbing stairs?: No Weakness of Legs: None Weakness of Arms/Hands: None  Home Assistive Devices/Equipment Home Assistive Devices/Equipment: None  Therapy Consults (therapy consults require a physician order) PT Evaluation Needed: No OT Evalulation Needed: No SLP Evaluation Needed: No Abuse/Neglect Assessment (Assessment to be complete while patient is alone) Abuse/Neglect Assessment Can Be Completed: Yes Physical Abuse:  Denies Verbal Abuse: Denies Sexual Abuse: Denies Exploitation of patient/patient's resources: Denies Self-Neglect: Denies Values / Beliefs Cultural Requests During Hospitalization: None Spiritual Requests During Hospitalization: None Consults Spiritual Care Consult Needed: No Social Work Consult Needed: No Regulatory affairs officer (For Healthcare) Does Patient Have a Medical Advance Directive?: No Would patient like information on creating a medical advance directive?: No - Patient declined       Disposition: Lindon Romp, NP, reviewed pt's chart and information and determined pt meets criteria for inpatient hospitalization. Pt is currently pending at [facility]. This information was provided to pt's nurse, Demaris Callander, at [phone].   Disposition Initial Assessment Completed for this Encounter: Yes Patient referred to: Other (Comment)(Pt is pending at [facility])  This service was provided via telemedicine using a 2-way, interactive audio and video technology.  Names of all persons participating in this telemedicine service and their role in this encounter. Name: Anastasia Pall Role: Patient  Name: Lindon Romp Role: Nurse Practitioner  Name: Windell Hummingbird Role: Clinician    Dannielle Burn 04/20/2019 2:23 AM

## 2019-04-20 NOTE — Tx Team (Signed)
Initial Treatment Plan 04/20/2019 5:42 PM [name] [id number]    PATIENT STRESSORS: Other: AUDITORY/VISUAL HALLUCINATIONS   PATIENT STRENGTHS: Ability for insight Communication skills General fund of knowledge Motivation for treatment/growth Physical Health Religious Affiliation Supportive family/friends   PATIENT IDENTIFIED PROBLEMS: Suicidal Ideation  Hallucinations  Depression                 DISCHARGE CRITERIA:  Ability to meet basic life and health needs Improved stabilization in mood, thinking, and/or behavior Medical problems require only outpatient monitoring Motivation to continue treatment in a less acute level of care Need for constant or close observation no longer present Verbal commitment to aftercare and medication compliance  PRELIMINARY DISCHARGE PLAN: Outpatient therapy Return to previous living arrangement Return to previous work or school arrangements  PATIENT/FAMILY INVOLVEMENT: This treatment plan has been presented to and reviewed with the patient, Jeffrey Coleman, and/or family member.  The patient and family have been given the opportunity to ask questions and make suggestions.  Judie Petit, RN 04/20/2019, 5:42 PM

## 2019-04-20 NOTE — ED Provider Notes (Signed)
[facility] EMERGENCY DEPARTMENT Provider Note   CSN: [id number] Arrival date & time: 04/20/19  0107     History   Chief Complaint Chief Complaint  Patient presents with  . V70.1    HPI Jeffrey Coleman is a 38 y.o. male.     Patient with history of anxiety and schizoaffective schizophrenia presenting with thoughts of suicide, hearing voices and thoughts of homicide.  States he has been hearing voices for a long time but has become acutely worse in the past several days.  States he is hearing voices to "kill a lot of people so the cops will kill me".  States his plan is to have suicide by cop.  States he is hearing multiple voices in his head there that are telling him to hurt people.  He states he has not taken his medications for the past 6 months as he was incarcerated up until 3 months ago.  Admits to smoking crack tonight but does not use it on a regular basis.  Denies any other drug use or regular alcohol use.  Denies any somatic complaints.  No headache, chest pain, shortness of breath, nausea, vomiting, fever or chills.  The history is provided by the patient and the EMS personnel.    Past Medical History:  Diagnosis Date  . Anxiety   . Schizo affective schizophrenia [facility])     Patient Active Problem List   Diagnosis Date Noted  . Cannabis use disorder, moderate, dependence ([place]) 03/14/2016  . Schizophrenia, paranoid ([place]) 05/31/2015  . Tobacco use disorder 08/22/2014    History reviewed. No pertinent surgical history.      Home Medications    Prior to Admission medications   Medication Sig Start Date End Date Taking? Authorizing Provider  ARIPiprazole ER (ABILIFY MAINTENA) 400 MG SUSR Inject 400 mg into the muscle every 30 (thirty) days. 03/15/16   Hildred Priest, MD  benztropine (COGENTIN) 1 MG tablet Take 1 mg by mouth daily.     [provider]  levETIRAcetam (KEPPRA) 500 MG tablet Take 1 tablet (500 mg total) by mouth 2 (two) times  daily. 10/01/16   Elnora Morrison, MD  traZODone (DESYREL) 150 MG tablet Take 1 tablet (150 mg total) by mouth at bedtime. Patient not taking: Reported on 10/01/2016 03/14/16   Hildred Priest, MD    Family History Family History  Problem Relation Age of Onset  . Cancer Father     Social History Social History   Tobacco Use  . Smoking status: Current Every Day Smoker    Packs/day: 1.00    Years: 15.00    Pack years: 15.00    Types: Cigarettes  . Smokeless tobacco: Never Used  Substance Use Topics  . Alcohol use: Yes    Comment: occasional  . Drug use: Yes    Comment: crack - yesterday     Allergies   Patient has no known allergies.   Review of Systems Review of Systems  Constitutional: Negative for activity change, appetite change and fever.  HENT: Negative for congestion and rhinorrhea.   Eyes: Negative for visual disturbance.  Respiratory: Negative for cough, chest tightness and shortness of breath.   Cardiovascular: Negative for chest pain.  Gastrointestinal: Negative for abdominal pain, nausea and vomiting.  Genitourinary: Negative for dysuria and hematuria.  Musculoskeletal: Negative for arthralgias and myalgias.  Skin: Negative for rash.  Neurological: Negative for dizziness, weakness and headaches.  Psychiatric/Behavioral: Positive for decreased concentration, self-injury, sleep disturbance and suicidal ideas. The patient is  nervous/anxious and is hyperactive.     all other systems are negative except as noted in the HPI and PMH.    Physical Exam Updated Vital Signs BP (!) 129/92   Pulse 76   Temp 98.3 F (36.8 C)   Resp 18   Ht 5\' 4"  (1.626 m)   Wt 68 kg   SpO2 100%   BMI 25.75 kg/m   Physical Exam Vitals signs and nursing note reviewed.  Constitutional:      General: He is not in acute distress.    Appearance: He is well-developed.     Comments: Calm and cooperative  HENT:     Head: Normocephalic and atraumatic.     Mouth/Throat:      Pharynx: No oropharyngeal exudate.  Eyes:     Conjunctiva/sclera: Conjunctivae normal.     Pupils: Pupils are equal, round, and reactive to light.  Neck:     Musculoskeletal: Normal range of motion and neck supple.     Comments: No meningismus. Cardiovascular:     Rate and Rhythm: Normal rate and regular rhythm.     Heart sounds: Normal heart sounds. No murmur.  Pulmonary:     Effort: Pulmonary effort is normal. No respiratory distress.     Breath sounds: Normal breath sounds.  Abdominal:     Palpations: Abdomen is soft.     Tenderness: There is no abdominal tenderness. There is no guarding or rebound.  Musculoskeletal: Normal range of motion.        General: No tenderness.  Skin:    General: Skin is warm.     Capillary Refill: Capillary refill takes less than 2 seconds.  Neurological:     General: No focal deficit present.     Mental Status: He is alert and oriented to person, place, and time. Mental status is at baseline.     Cranial Nerves: No cranial nerve deficit.     Motor: No abnormal muscle tone.     Coordination: Coordination normal.     Comments: No ataxia on finger to nose bilaterally. No pronator drift. 5/5 strength throughout. CN 2-12 intact.Equal grip strength. Sensation intact.   Psychiatric:        Behavior: Behavior normal.      ED Treatments / Results  Labs (all labs ordered are listed, but only abnormal results are displayed) Labs Reviewed  CBC WITH DIFFERENTIAL/PLATELET - Abnormal; Notable for the following components:      Result Value   WBC 11.5 (*)    All other components within normal limits  ETHANOL - Abnormal; Notable for the following components:   Alcohol, Ethyl (B) 21 (*)    All other components within normal limits  ACETAMINOPHEN LEVEL - Abnormal; Notable for the following components:   Acetaminophen (Tylenol), Serum <10 (*)    All other components within normal limits  RAPID URINE DRUG SCREEN, HOSP PERFORMED - Abnormal; Notable for  the following components:   Cocaine POSITIVE (*)    All other components within normal limits  COMPREHENSIVE METABOLIC PANEL  SALICYLATE LEVEL    EKG None  Radiology No results found.  Procedures Procedures (including critical care time)  Medications Ordered in ED Medications - No data to display   Initial Impression / Assessment and Plan / ED Course  I have reviewed the triage vital signs and the nursing notes.  Pertinent labs & imaging results that were available during my care of the patient were reviewed by me and considered in my medical decision  making (see chart for details).       Patient with suicidal and homicidal thoughts and hearing voices.  He is calm and cooperative.  Screening labs will be obtained. TTS consult.   Screening labs are reassuring.  UDS positive for cocaine.  Patient medically clear for TTS evaluation. Holding orders placed.    Final Clinical Impressions(s) / ED Diagnoses   Final diagnoses:  None    ED Discharge Orders    None       [place] Ndiaye, Annie Main, MD 04/20/19 [phone]

## 2019-04-21 DIAGNOSIS — F200 Paranoid schizophrenia: Principal | ICD-10-CM

## 2019-04-21 LAB — LIPID PANEL
Cholesterol: 151 mg/dL (ref 0–200)
HDL: 45 mg/dL (ref >40)
LDL Cholesterol: 81 mg/dL (ref 0–99)
Total CHOL/HDL Ratio: 3.4 RATIO
Triglycerides: 123 mg/dL (ref <150)
VLDL: 25 mg/dL (ref 0–40)

## 2019-04-21 LAB — TSH: TSH: 0.139 u[IU]/mL — ABNORMAL LOW (ref 0.350–4.500)

## 2019-04-21 LAB — HEMOGLOBIN A1C
Hgb A1c MFr Bld: 5 % (ref 4.8–5.6)
Mean Plasma Glucose: 96.8 mg/dL

## 2019-04-21 MED ORDER — BENZTROPINE MESYLATE 0.5 MG PO TABS
0.5000 mg | ORAL_TABLET | Freq: Two times a day (BID) | ORAL | Status: DC
  Administered 2019-04-21 – 2019-04-23 (×5): 0.5 mg via ORAL
  Filled 2019-04-21: qty 14
  Filled 2019-04-21 (×4): qty 1
  Filled 2019-04-21 (×2): qty 14
  Filled 2019-04-21 (×2): qty 1
  Filled 2019-04-21: qty 14
  Filled 2019-04-21: qty 1

## 2019-04-21 MED ORDER — ARIPIPRAZOLE ER 400 MG IM SRER
400.0000 mg | INTRAMUSCULAR | Status: DC
  Administered 2019-04-21: 400 mg via INTRAMUSCULAR

## 2019-04-21 MED ORDER — RISPERIDONE 2 MG PO TABS
2.0000 mg | ORAL_TABLET | Freq: Two times a day (BID) | ORAL | Status: DC
  Administered 2019-04-21 – 2019-04-22 (×3): 2 mg via ORAL
  Filled 2019-04-21 (×5): qty 1

## 2019-04-21 MED ORDER — HYDROXYZINE HCL 25 MG PO TABS: 25.0000 mg | ORAL_TABLET | Freq: Three times a day (TID) | ORAL | Status: DC | PRN

## 2019-04-21 MED ORDER — TEMAZEPAM 30 MG PO CAPS
30.0000 mg | ORAL_CAPSULE | Freq: Every day | ORAL | Status: DC
  Administered 2019-04-22: 30 mg via ORAL
  Filled 2019-04-21 (×2): qty 1

## 2019-04-21 NOTE — Progress Notes (Signed)
Recreation Therapy Notes  INPATIENT RECREATION THERAPY ASSESSMENT  Patient Details Name: Jeffrey Coleman MRN: [id number] DOB: 04/06/1981 Today's Date: 04/21/2019       Information Obtained From: Patient  Able to Participate in Assessment/Interview: Yes  Patient Presentation: Alert  Reason for Admission (Per Patient): Suicidal Ideation, Other (Comments)(Homicidal thoughts)  Patient Stressors: Other (Coleman)(My thoughts)  Coping Skills:   Isolation, Write, TV, Sports, Arguments, Aggression, Music, Exercise, Substance Abuse, Impulsivity, Avoidance, Hot Bath/Shower  Leisure Interests (2+):  Music - Write music  Frequency of Recreation/Participation: Other (Coleman)(Daily)  Awareness of Community Resources:  No  Expressed Interest in [place]: No  [place] Residence:  [place]  Patient Main Form of Transportation: Car  Patient Strengths:  Positive; Outgoing  Patient Identified Areas of Improvement:  Expectations; Reliability  Patient Goal for Hospitalization:  "self improve"  Current SI (including self-harm):  Yes(Rated an 8; contracts for safety)  Current HI:  Yes(Rated an 8; contracts for safety)  Current AVH: Yes(sees shadows; hears voices telling him to be distructive)  Staff Intervention Plan: Group Attendance, Collaborate with Interdisciplinary Treatment Team  Consent to Intern Participation: N/Coleman     Jeffrey Coleman, LRT/CTRS  Jeffrey Coleman, Jeffrey Coleman 04/21/2019, 11:15 AM

## 2019-04-21 NOTE — H&P (Signed)
Psychiatric Admission Assessment Adult  Patient Identification: Jeffrey Coleman MRN:  [id number] Date of Evaluation:  04/21/2019 Chief Complaint:  Schizophrenia Principal Diagnosis: Exacerbation of underlying psychotic disorder/substance abuse Diagnosis:  Active Problems:   Schizophrenia ([place])  History of Present Illness:   This is the latest of multiple psychiatric admissions, the first here since 2016 for Jeffrey Coleman, 38 year old patient who is thought to have a schizophrenic condition versus schizoaffective disorder bipolar type.  His case is complicated by intermittent compliance and cannabis abuse as well as cocaine abuse.  We do not have a current drug screen. He presented on 8/17 complaining of noncompliance x6 months, auditory hallucinations telling him to kill others or be killed by police officers, reporting a 3-day exacerbation in these hallucinations. In the past he had done well with long-acting injectable Abilify he been involved with an act team in the [place] area, he lives in [place], however again is been lost to follow-up recently. He has past episodes of self-harm. He is a daily cannabis user and an occasional cocaine user states he smoked cocaine on the date of 8/17 for the first time in over a year. Other stressors included feeling isolated where he lives, no social contacts, frustration at his drug use.  Currently he is cooperative alert and oriented to person place situation time reports recent but no current auditory hallucinations recent but no current thoughts of harming self contracting here.  Describes voices both inside and outside his head, denies visual hallucinations.    Associated Signs/Symptoms: Depression Symptoms:  psychomotor retardation, (Hypo) Manic Symptoms:  Hallucinations, Anxiety Symptoms:  Excessive Worry, Psychotic Symptoms:  Hallucinations: Auditory PTSD Symptoms: NA Total Time spent with patient: 45 minutes  Past Psychiatric  History: Multiple prior admissions  Is the patient at risk to self? Yes.    Has the patient been a risk to self in the past 6 months? No.  Has the patient been a risk to self within the distant past? No.  Is the patient a risk to others? No.  Has the patient been a risk to others in the past 6 months? No.  Has the patient been a risk to others within the distant past? No.   Alcohol Screening: 1. How often do you have a drink containing alcohol?: 4 or more times a week 2. How many drinks containing alcohol do you have on a typical day when you are drinking?: 1 or 2 3. How often do you have six or more drinks on one occasion?: Never AUDIT-C Score: 4 4. How often during the last year have you found that you were not able to stop drinking once you had started?: Never 5. How often during the last year have you failed to do what was normally expected from you becasue of drinking?: Never 6. How often during the last year have you needed a first drink in the morning to get yourself going after a heavy drinking session?: Never 7. How often during the last year have you had a feeling of guilt of remorse after drinking?: Never 8. How often during the last year have you been unable to remember what happened the night before because you had been drinking?: Never 9. Have you or someone else been injured as a result of your drinking?: No 10. Has a relative or friend or a doctor or another health worker been concerned about your drinking or suggested you cut down?: No Alcohol Use Disorder Identification Test Final Score (AUDIT): 4 Substance Abuse History in  the last 12 months:  Yes.   Consequences of Substance Abuse: NA Previous Psychotropic Medications: Yes  Psychological Evaluations: No  Past Medical History:  Past Medical History:  Diagnosis Date  . Anxiety   . Depression   . Schizo affective schizophrenia ([place])    History reviewed. No pertinent surgical history. Family History:  Family History   Problem Relation Age of Onset  . Cancer Father    Family Psychiatric  History: no new data Tobacco Screening:   Social History:  Social History   Substance and Sexual Activity  Alcohol Use Yes   Comment: occasional     Social History   Substance and Sexual Activity  Drug Use Yes  . Types: Marijuana, Cocaine   Comment: crack - yesterday    Additional Social History:                           Allergies:  No Known Allergies Lab Results:  Results for orders placed or performed during the hospital encounter of 04/20/19 (from the past 48 hour(s))  Lipid panel     Status: None   Collection Time: 04/21/19  6:21 AM  Result Value Ref Range   Cholesterol 151 0 - 200 mg/dL   Triglycerides 123 <150 mg/dL   HDL 45 >40 mg/dL   Total CHOL/HDL Ratio 3.4 RATIO   VLDL 25 0 - 40 mg/dL   LDL Cholesterol 81 0 - 99 mg/dL    Comment:        Total Cholesterol/HDL:CHD Risk Coronary Heart Disease Risk Table                     Men   Women  1/2 Average Risk   3.4   3.3  Average Risk       5.0   4.4  2 X Average Risk   9.6   7.1  3 X Average Risk  23.4   11.0        Use the calculated Patient Ratio above and the CHD Risk Table to determine the patient's CHD Risk.        ATP III CLASSIFICATION (LDL):  <100     mg/dL   Optimal  100-129  mg/dL   Near or Above                    Optimal  130-159  mg/dL   Borderline  160-189  mg/dL   High  >190     mg/dL   Very High Performed at [facility] [address], [place], [place] [address]     Blood Alcohol level:  Lab Results  Component Value Date   ETH 21 (H) 04/20/2019   ETH <5 40/34/7425    Metabolic Disorder Labs:  Lab Results  Component Value Date   HGBA1C 4.9 05/31/2015   MPG 105 09/15/2014   Lab Results  Component Value Date   PROLACTIN 2.7 (L) 03/14/2016   Lab Results  Component Value Date   CHOL 151 04/21/2019   TRIG 123 04/21/2019   HDL 45 04/21/2019   CHOLHDL 3.4 04/21/2019   VLDL  25 04/21/2019   LDLCALC 81 04/21/2019   LDLCALC 113 (H) 05/31/2015    Current Medications: Current Facility-Administered Medications  Medication Dose Route Frequency Provider Last Rate Last Dose  . acetaminophen (TYLENOL) tablet 650 mg  650 mg Oral Q6H PRN Derrill Center, NP      .  alum & mag hydroxide-simeth (MAALOX/MYLANTA) 200-200-20 MG/5ML suspension 30 mL  30 mL Oral Q4H PRN Derrill Center, NP      . ARIPiprazole ER (ABILIFY MAINTENA) injection 400 mg  400 mg Intramuscular Q28 days Johnn Hai, MD      . benztropine (COGENTIN) tablet 0.5 mg  0.5 mg Oral BID Johnn Hai, MD      . hydrOXYzine (ATARAX/VISTARIL) tablet 25 mg  25 mg Oral TID PRN Johnn Hai, MD      . magnesium hydroxide (MILK OF MAGNESIA) suspension 30 mL  30 mL Oral Daily PRN Derrill Center, NP      . risperiDONE (RISPERDAL) tablet 2 mg  2 mg Oral BID Johnn Hai, MD      . temazepam (RESTORIL) capsule 30 mg  30 mg Oral QHS Johnn Hai, MD      . traZODone (DESYREL) tablet 50 mg  50 mg Oral QHS PRN Derrill Center, NP       PTA Medications: Medications Prior to Admission  Medication Sig Dispense Refill Last Dose  . ARIPiprazole ER (ABILIFY MAINTENA) 400 MG SUSR Inject 400 mg into the muscle every 30 (thirty) days. 1 each 0   . benztropine (COGENTIN) 1 MG tablet Take 1 mg by mouth daily.      Marland Kitchen levETIRAcetam (KEPPRA) 500 MG tablet Take 1 tablet (500 mg total) by mouth 2 (two) times daily. 60 tablet 1   . traZODone (DESYREL) 150 MG tablet Take 1 tablet (150 mg total) by mouth at bedtime. (Patient not taking: Reported on 10/01/2016) 30 tablet 0     Musculoskeletal: Strength & Muscle Tone: within normal limits Gait & Station: normal Patient leans: N/A  Psychiatric Specialty Exam: Physical Exam  Nursing note and vitals reviewed. Constitutional: He appears well-developed and well-nourished.  Cardiovascular: Normal rate and regular rhythm.    Review of Systems  Constitutional: Negative.    Gastrointestinal: Negative.   Neurological: Negative.   Endo/Heme/Allergies: Negative.     Blood pressure 109/83, pulse 72, temperature 97.7 F (36.5 C), temperature source Oral, resp. rate 18, height 5\' 4"  (1.626 m), weight 68 kg, SpO2 100 %.Body mass index is 25.73 kg/m.  General Appearance: Disheveled  Eye Contact:  Minimal  Speech:  Normal Rate  Volume:  Decreased  Mood:  Anxious and Dysphoric  Affect:  Congruent and Constricted  Thought Process:  Linear and Descriptions of Associations: Circumstantial  Orientation:  Full (Time, Place, and Person)  Thought Content:  Hallucinations: Auditory  Suicidal Thoughts:  Yes.  without intent/plan  Homicidal Thoughts:  No  Memory:  Immediate;   Fair Recent;   Fair Remote;   Fair  Judgement:  Fair  Insight:  Fair  Psychomotor Activity:  Normal  Concentration:  Concentration: Fair and Attention Span: Fair  Recall:  [employer] of Knowledge:  Fair  Language:  Fair  Akathisia:  Negative  Handed:  Right  AIMS (if indicated):     Assets:  Resilience Social Support  ADL's:  Intact  Cognition:  WNL  Sleep:  Number of Hours: 6.75    Treatment Plan Summary: Daily contact with patient to assess and evaluate symptoms and progress in treatment and Medication management  Observation Level/Precautions:  15 minute checks  Laboratory:  UDS  Psychotherapy: Cognitive and reality based  Medications: Resume long-acting injectable with orals overlap  Consultations: Not necessary  Discharge Concerns: Longer-term compliance and stability and sobriety  Estimated LOS: 5-7  Other: Axis I schizophrenia/cannabis dependence/cocaine abuse  Physician Treatment Plan for Primary Diagnosis: <principal problem not specified> Long Term Goal(s): Improvement in symptoms so as ready for discharge  Short Term Goals: Ability to verbalize feelings will improve, Ability to disclose and discuss suicidal ideas, Ability to demonstrate self-control will improve,  Ability to identify and develop effective coping behaviors will improve and Ability to maintain clinical measurements within normal limits will improve  Physician Treatment Plan for Secondary Diagnosis: Active Problems:   Schizophrenia ([place])  Long Term Goal(s): Improvement in symptoms so as ready for discharge  Short Term Goals: Ability to verbalize feelings will improve, Ability to disclose and discuss suicidal ideas, Ability to demonstrate self-control will improve, Ability to identify and develop effective coping behaviors will improve and Ability to maintain clinical measurements within normal limits will improve  I certify that inpatient services furnished can reasonably be expected to improve the patient's condition.    Johnn Hai, MD 8/18/20208:21 AM

## 2019-04-21 NOTE — Progress Notes (Addendum)
D: Pt  Passive SI/ AH- contracts for safetydenies HI/VH. Pt is pleasant and cooperative. Pt stayed in room much of the evening A: Pt was offered support and encouragement. Pt was encourage to attend groups. Q 15 minute checks were done for safety.  R:: and safety maintained on unit.

## 2019-04-21 NOTE — Progress Notes (Signed)
Recreation Therapy Notes  Date: 8.18.20 Time: 1000 Location: 500 Hall Day Room  Group Topic: Communication, Team Building, Problem Solving  Goal Area(s) Addresses:  Patient will effectively work with peer towards shared goal.  Patient will identify skill used to make activity successful.  Patient will identify how skills used during activity can be used to reach post d/c goals.   Behavioral Response: Engaged  Intervention: STEM Activity   Activity:  Straw Bridge.  In groups, patients were given 15 straws and 31ft of masking tape.  Patients were to construct an elevated bridge using all the supplies given that could hold a small puzzle box.  Education: Education officer, community, Dentist.   Education Outcome: Acknowledges education  Clinical Observations/Feedback: Pt was engaged and worked well with his peers.  Pt stated the group used visualization to complete the assignment.  Pt was able to focus and social with peers.       Victorino Sparrow, LRT/CTRS    Victorino Sparrow A 04/21/2019 11:17 AM

## 2019-04-21 NOTE — BHH Suicide Risk Assessment (Signed)
[facility] Admission Suicide Risk Assessment   Nursing information obtained from:  Patient Demographic factors:  Male Current Mental Status:  NA Loss Factors:  Legal issues Historical Factors:  NA Risk Reduction Factors:  Sense of responsibility to family, Living with another person, especially a relative  Total Time spent with patient: 45 minutes Principal Problem: Exacerbation of underlying psychotic disorder with thoughts of self-harm Diagnosis:  Active Problems:   Schizophrenia ([place])  Subjective Data: Latest of multiple admissions with similar presentations patient off his medication  Continued Clinical Symptoms:  Alcohol Use Disorder Identification Test Final Score (AUDIT): 4 The "Alcohol Use Disorders Identification Test", Guidelines for Use in Primary Care, Second Edition.  World Pharmacologist [facility]). Score between 0-7:  no or low risk or alcohol related problems. Score between 8-15:  moderate risk of alcohol related problems. Score between 16-19:  high risk of alcohol related problems. Score 20 or above:  warrants further diagnostic evaluation for alcohol dependence and treatment.   CLINICAL FACTORS:   Alcohol/Substance Abuse/Dependencies   Musculoskeletal: Strength & Muscle Tone: within normal limits Gait & Station: normal Patient leans: N/A  Psychiatric Specialty Exam: Physical Exam  Nursing note and vitals reviewed. Constitutional: He appears well-developed and well-nourished.  Cardiovascular: Normal rate and regular rhythm.    Review of Systems  Constitutional: Negative.   Gastrointestinal: Negative.   Neurological: Negative.   Endo/Heme/Allergies: Negative.     Blood pressure 109/83, pulse 72, temperature 97.7 F (36.5 C), temperature source Oral, resp. rate 18, height 5\' 4"  (1.626 m), weight 68 kg, SpO2 100 %.Body mass index is 25.73 kg/m.  General Appearance: Disheveled  Eye Contact:  Minimal  Speech:  Normal Rate  Volume:  Decreased  Mood:  Anxious  and Dysphoric  Affect:  Congruent and Constricted  Thought Process:  Linear and Descriptions of Associations: Circumstantial  Orientation:  Full (Time, Place, and Person)  Thought Content:  Hallucinations: Auditory  Suicidal Thoughts:  Yes.  without intent/plan  Homicidal Thoughts:  No  Memory:  Immediate;   Fair Recent;   Fair Remote;   Fair  Judgement:  Fair  Insight:  Fair  Psychomotor Activity:  Normal  Concentration:  Concentration: Fair and Attention Span: Fair  Recall:  [employer] of Knowledge:  Fair  Language:  Fair  Akathisia:  Negative  Handed:  Right  AIMS (if indicated):     Assets:  Resilience Social Support  ADL's:  Intact  Cognition:  WNL  Sleep:  Number of Hours: 6.75      COGNITIVE FEATURES THAT CONTRIBUTE TO RISK:  Loss of executive function    SUICIDE RISK:   Mild:  Suicidal ideation of limited frequency, intensity, duration, and specificity.  There are no identifiable plans, no associated intent, mild dysphoria and related symptoms, good self-control (both objective and subjective assessment), few other risk factors, and identifiable protective factors, including available and accessible social support.  PLAN OF CARE: Admit for stabilization has benefited from long-acting injectable previously  I certify that inpatient services furnished can reasonably be expected to improve the patient's condition.   Johnn Hai, MD 04/21/2019, 8:19 AM

## 2019-04-21 NOTE — Progress Notes (Signed)
Psychoeducational Group Note  Date:  04/21/2019 Time:  2103  Group Topic/Focus:  Wrap-Up Group:   The focus of this group is to help patients review their daily goal of treatment and discuss progress on daily workbooks.  Participation Level: Did Not Attend  Participation Quality:  Not Applicable  Affect:  Not Applicable  Cognitive:  Not Applicable  Insight:  Not Applicable  Engagement in Group: Not Applicable  Additional Comments:  The patient did not attend group this evening.   Archie Balboa S 04/21/2019, 9:03 PM
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
## 2019-04-22 LAB — PROLACTIN: Prolactin: 25 ng/mL — ABNORMAL HIGH (ref 4.0–15.2)

## 2019-04-22 MED ORDER — RISPERIDONE 3 MG PO TABS
3.0000 mg | ORAL_TABLET | Freq: Two times a day (BID) | ORAL | Status: DC
  Administered 2019-04-22 – 2019-04-23 (×2): 3 mg via ORAL
  Filled 2019-04-22 (×6): qty 1
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
## 2019-04-22 NOTE — BHH Group Notes (Signed)
Occupational Therapy Group Note  Date:  04/22/2019 Time:  2:41 PM  Group Topic/Focus:  Stress Management  Participation Level:  Active  Participation Quality:  Appropriate  Affect:  Flat  Cognitive:  Alert  Insight: Improving  Engagement in Group:  Engaged  Modes of Intervention:  Activity, Discussion, Education and Socialization  Additional Comments:    S: exercise, specifically push ups are helpful to me  O: Group focus on healthy stress management skills. Pt to engage in stress management discussion and make craft to display skills learned.  A: Pt presents to group very engaged in stress management skills discussion. Pt also completed craft with minimal cues and improved affect. When cued he engaged socially appropriately.   P: OT group will be x1 per week while pt inpatient.  Zenovia Jarred, MSOT, OTR/L Behavioral Health OT/ Acute Relief OT PHP Office: [place] 04/22/2019, 2:41 PM

## 2019-04-22 NOTE — [facility] Group Notes (Addendum)
[facility] Group Notes:  (Nursing/MHT/Case Management/Adjunct)  Date:  04/22/2019  Time:  12:30 PM  Type of Therapy:  Nurse Education  Participation Level:  Active  Participation Quality:  Appropriate and Attentive  Affect:  Appropriate  Cognitive:  Alert and Appropriate  Insight:  Appropriate and Improving  Engagement in Group:  Developing/Improving, Engaged and Improving  Modes of Intervention:  Discussion, Education and Exploration  Summary of Progress/Problems: Pt's were asked what personal development meant to them, what/how they were trying to achieve this in their daily lives. Pt's discussed hindrances to personal development, and how to overcome these as well. Pt was very appropriate in group, sharing multiple times and asking thought provoking questions. Pt also shared many experiences and was respectful of other pt's time and need to share.   Otelia Limes Vina Byrd 04/22/2019, 1:23 PM

## 2019-04-22 NOTE — Progress Notes (Signed)
D: Pt passive AH/ SI- contract and said its getting better. Pt is pleasant and cooperative. Pt visible on the unit some this evening but keeps to himself a lot.  A: Pt was offered support and encouragement. Pt was given scheduled medications. Pt was encourage to attend groups. Q 15 minute checks were done for safety.  R: safety maintained on unit.

## 2019-04-22 NOTE — Tx Team (Signed)
Interdisciplinary Treatment and Diagnostic Plan Update  04/22/2019 Time of Session: 10:20am Jeffrey Coleman MRN: [id number]  Principal Diagnosis: <principal problem not specified>  Secondary Diagnoses: Active Problems:   Schizophrenia ([place])   Current Medications:  Current Facility-Administered Medications  Medication Dose Route Frequency Provider Last Rate Last Dose  . acetaminophen (TYLENOL) tablet 650 mg  650 mg Oral Q6H PRN Derrill Center, NP      . alum & mag hydroxide-simeth (MAALOX/MYLANTA) 200-200-20 MG/5ML suspension 30 mL  30 mL Oral Q4H PRN Derrill Center, NP      . ARIPiprazole ER (ABILIFY MAINTENA) injection 400 mg  400 mg Intramuscular Q28 days Johnn Hai, MD   400 mg at 04/21/19 1259  . benztropine (COGENTIN) tablet 0.5 mg  0.5 mg Oral BID Johnn Hai, MD   0.5 mg at 04/22/19 3662  . hydrOXYzine (ATARAX/VISTARIL) tablet 25 mg  25 mg Oral TID PRN Johnn Hai, MD      . magnesium hydroxide (MILK OF MAGNESIA) suspension 30 mL  30 mL Oral Daily PRN Derrill Center, NP      . risperiDONE (RISPERDAL) tablet 3 mg  3 mg Oral BID Johnn Hai, MD      . temazepam (RESTORIL) capsule 30 mg  30 mg Oral QHS Johnn Hai, MD      . traZODone (DESYREL) tablet 50 mg  50 mg Oral QHS PRN Derrill Center, NP       PTA Medications: Medications Prior to Admission  Medication Sig Dispense Refill Last Dose  . ARIPiprazole ER (ABILIFY MAINTENA) 400 MG SUSR Inject 400 mg into the muscle every 30 (thirty) days. (Patient not taking: Reported on 04/21/2019) 1 each 0 Not Taking at Unknown time  . benztropine (COGENTIN) 1 MG tablet Take 1 mg by mouth daily.    Not Taking at Unknown time  . levETIRAcetam (KEPPRA) 500 MG tablet Take 1 tablet (500 mg total) by mouth 2 (two) times daily. (Patient not taking: Reported on 04/21/2019) 60 tablet 1 Not Taking at Unknown time  . traZODone (DESYREL) 150 MG tablet Take 1 tablet (150 mg total) by mouth at bedtime. (Patient not taking: Reported on 10/01/2016) 30  tablet 0 Not Taking at Unknown time    Patient Stressors: Other: AUDITORY/VISUAL HALLUCINATIONS  Patient Strengths: Ability for insight Communication skills General fund of knowledge Motivation for treatment/growth Physical Health Religious Affiliation Supportive family/friends  Treatment Modalities: Medication Management, Group therapy, Case management,  1 to 1 session with clinician, Psychoeducation, Recreational therapy.   Physician Treatment Plan for Primary Diagnosis: <principal problem not specified> Long Term Goal(s): Improvement in symptoms so as ready for discharge Improvement in symptoms so as ready for discharge   Short Term Goals: Ability to verbalize feelings will improve Ability to disclose and discuss suicidal ideas Ability to demonstrate self-control will improve Ability to identify and develop effective coping behaviors will improve Ability to maintain clinical measurements within normal limits will improve Ability to verbalize feelings will improve Ability to disclose and discuss suicidal ideas Ability to demonstrate self-control will improve Ability to identify and develop effective coping behaviors will improve Ability to maintain clinical measurements within normal limits will improve  Medication Management: Evaluate patient's response, side effects, and tolerance of medication regimen.  Therapeutic Interventions: 1 to 1 sessions, Unit Group sessions and Medication administration.  Evaluation of Outcomes: Progressing  Physician Treatment Plan for Secondary Diagnosis: Active Problems:   Schizophrenia ([place])  Long Term Goal(s): Improvement in symptoms so as ready for discharge Improvement  in symptoms so as ready for discharge   Short Term Goals: Ability to verbalize feelings will improve Ability to disclose and discuss suicidal ideas Ability to demonstrate self-control will improve Ability to identify and develop effective coping behaviors will  improve Ability to maintain clinical measurements within normal limits will improve Ability to verbalize feelings will improve Ability to disclose and discuss suicidal ideas Ability to demonstrate self-control will improve Ability to identify and develop effective coping behaviors will improve Ability to maintain clinical measurements within normal limits will improve     Medication Management: Evaluate patient's response, side effects, and tolerance of medication regimen.  Therapeutic Interventions: 1 to 1 sessions, Unit Group sessions and Medication administration.  Evaluation of Outcomes: Progressing   RN Treatment Plan for Primary Diagnosis: <principal problem not specified> Long Term Goal(s): Knowledge of disease and therapeutic regimen to maintain health will improve  Short Term Goals: Ability to participate in decision making will improve, Ability to verbalize feelings will improve, Ability to disclose and discuss suicidal ideas, Ability to identify and develop effective coping behaviors will improve and Compliance with prescribed medications will improve  Medication Management: RN will administer medications as ordered by provider, will assess and evaluate patient's response and provide education to patient for prescribed medication. RN will report any adverse and/or side effects to prescribing provider.  Therapeutic Interventions: 1 on 1 counseling sessions, Psychoeducation, Medication administration, Evaluate responses to treatment, Monitor vital signs and CBGs as ordered, Perform/monitor CIWA, COWS, AIMS and Fall Risk screenings as ordered, Perform wound care treatments as ordered.  Evaluation of Outcomes: Progressing   LCSW Treatment Plan for Primary Diagnosis: <principal problem not specified> Long Term Goal(s): Safe transition to appropriate next level of care at discharge, Engage patient in therapeutic group addressing interpersonal concerns.  Short Term Goals: Engage  patient in aftercare planning with referrals and resources and Increase skills for wellness and recovery  Therapeutic Interventions: Assess for all discharge needs, 1 to 1 time with Social worker, Explore available resources and support systems, Assess for adequacy in community support network, Educate family and significant other(s) on suicide prevention, Complete Psychosocial Assessment, Interpersonal group therapy.  Evaluation of Outcomes: Progressing   Progress in Treatment: Attending groups: Yes. Participating in groups: Yes. Taking medication as prescribed: Yes. Toleration medication: Yes. Family/Significant other contact made: No, will contact:  pt's mother Patient understands diagnosis: Yes. Discussing patient identified problems/goals with staff: Yes. Medical problems stabilized or resolved: Yes. Denies suicidal/homicidal ideation: Yes. Issues/concerns per patient self-inventory: No. Other:   New problem(s) identified: No, Describe:  None  New Short Term/Long Term Goal(s): Medication stabilization, elimination of SI thoughts, and development of a comprehensive mental wellness plan.   Patient Goals:  "Better myself"  Discharge Plan or Barriers: Patient has an ACT team with [facility] in [place].   Reason for Continuation of Hospitalization: Hallucinations Medication stabilization  Estimated Length of Stay: 2-3 days    Attendees: Patient: Jeffrey Coleman 04/22/2019   Physician: Dr. Johnn Hai, MD 04/22/2019  Nursing: Desma Paganini, RN 04/22/2019   RN Care Manager: 04/22/2019   Social Worker: Ardelle Anton, LCSW 04/22/2019   Recreational Therapist:  04/22/2019   Other: Medical Student, Tiffany 04/22/2019   Other: Medical Student, Natalie  04/22/2019   Other: 04/22/2019     Scribe for Treatment Team: Trecia Rogers, LCSW 04/22/2019 11:09 AM

## 2019-04-22 NOTE — Progress Notes (Signed)
[facility] MD Progress Note  04/22/2019 8:11 AM Jeffrey Coleman  MRN:  [id number] Subjective:   Patient seen he reports he slept fair he reports a continuation of the auditory hallucinations that are sometimes inside, sometimes outside his head describes them as both he states that even when he is on his medication they are always there he is just hoping for a general reduction.  States he is can be staying with his mother.  No issues with drug withdrawal.  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 Anxiety   . Depression   . Schizo affective schizophrenia ([place])    History reviewed. No pertinent surgical history. Family History:  Family History  Problem Relation Age of Onset  . Cancer Father    Family Psychiatric  History: no new data Social History:  Social History   Substance and Sexual Activity  Alcohol Use Yes   Comment: occasional     Social History   Substance and Sexual Activity  Drug Use Yes  . Types: Marijuana, Cocaine   Comment: crack - yesterday    Social History   Socioeconomic History  . Marital status: Single    Spouse name: Not on file  . Number of children: Not on file  . Years of education: Not on file  . Highest education level: Not on file  Occupational History  . Not on file  Social Needs  . Financial resource strain: Not on file  . Food insecurity    Worry: Not on file    Inability: Not on file  . Transportation needs    Medical: Not on file    Non-medical: Not on file  Tobacco Use  . Smoking status: Current Every Day Smoker    Packs/day: 1.00    Years: 15.00    Pack years: 15.00    Types: Cigarettes  . Smokeless tobacco: Never Used  Substance and Sexual  Activity  . Alcohol use: Yes    Comment: occasional  . Drug use: Yes    Types: Marijuana, Cocaine    Comment: crack - yesterday  . Sexual activity: Not on file  Lifestyle  . Physical activity    Days per week: Not on file    Minutes per session: Not on file  . Stress: Not on file  Relationships  . Social Herbalist on phone: Not on file    Gets together: Not on file    Attends religious service: Not on file    Active member of club or organization: Not on file    Attends meetings of clubs or organizations: Not on file    Relationship status: Not on file  Other Topics Concern  . Not on file  Social History Narrative  . Not on file   Additional Social History:                         Sleep: Good  Appetite:  Good  Current Medications: Current Facility-Administered Medications  Medication Dose Route Frequency Provider Last Rate Last Dose  . acetaminophen (TYLENOL) tablet 650 mg  650 mg Oral Q6H PRN Derrill Center, NP      . alum & mag hydroxide-simeth (MAALOX/MYLANTA) 200-200-20 MG/5ML suspension 30 mL  30 mL  Oral Q4H PRN Derrill Center, NP      . ARIPiprazole ER (ABILIFY MAINTENA) injection 400 mg  400 mg Intramuscular Q28 days Johnn Hai, MD   400 mg at 04/21/19 1259  . benztropine (COGENTIN) tablet 0.5 mg  0.5 mg Oral BID Johnn Hai, MD   0.5 mg at 04/22/19 1856  . hydrOXYzine (ATARAX/VISTARIL) tablet 25 mg  25 mg Oral TID PRN Johnn Hai, MD      . magnesium hydroxide (MILK OF MAGNESIA) suspension 30 mL  30 mL Oral Daily PRN Derrill Center, NP      . risperiDONE (RISPERDAL) tablet 3 mg  3 mg Oral BID Johnn Hai, MD      . temazepam (RESTORIL) capsule 30 mg  30 mg Oral QHS Johnn Hai, MD      . traZODone (DESYREL) tablet 50 mg  50 mg Oral QHS PRN Derrill Center, NP        Lab Results:  Results for orders placed or performed during the hospital encounter of 04/20/19 (from the past 48 hour(s))  TSH     Status: Abnormal   Collection Time:  04/21/19  6:21 AM  Result Value Ref Range   TSH 0.139 (L) 0.350 - 4.500 uIU/mL    Comment: Performed by a 3rd Generation assay with a functional sensitivity of <=0.01 uIU/mL. Performed at [facility] [place] [address] [place], [place] [address]   Hemoglobin A1c     Status: None   Collection Time: 04/21/19  6:21 AM  Result Value Ref Range   Hgb A1c MFr Bld 5.0 4.8 - 5.6 %    Comment: (NOTE) Pre diabetes:          5.7%-6.4% Diabetes:              >6.4% Glycemic control for   <7.0% adults with diabetes    Mean Plasma Glucose 96.8 mg/dL    Comment: Performed at [facility] [address], [place],  [address]  Prolactin     Status: Abnormal   Collection Time: 04/21/19  6:21 AM  Result Value Ref Range   Prolactin 25.0 (H) 4.0 - 15.2 ng/mL    Comment: (NOTE) Performed At: [facility] [place], [place] [id number] Rush Farmer MD [id number]   Lipid panel     Status: None   Collection Time: 04/21/19  6:21 AM  Result Value Ref Range   Cholesterol 151 0 - 200 mg/dL   Triglycerides 123 <150 mg/dL   HDL 45 >40 mg/dL   Total CHOL/HDL Ratio 3.4 RATIO   VLDL 25 0 - 40 mg/dL   LDL Cholesterol 81 0 - 99 mg/dL    Comment:        Total Cholesterol/HDL:CHD Risk Coronary Heart Disease Risk Table                     Men   Women  1/2 Average Risk   3.4   3.3  Average Risk       5.0   4.4  2 X Average Risk   9.6   7.1  3 X Average Risk  23.4   11.0        Use the calculated Patient Ratio above and the CHD Risk Table to determine the patient's CHD Risk.        ATP III CLASSIFICATION (LDL):  <100     mg/dL   Optimal  100-129  mg/dL  Near or Above                    Optimal  130-159  mg/dL   Borderline  160-189  mg/dL   High  >190     mg/dL   Very High Performed at [facility] [address], [place], [place] [address]     Blood Alcohol level:  Lab Results  Component Value Date   ETH 21 (H)  04/20/2019   ETH <5 81/44/8185    Metabolic Disorder Labs: Lab Results  Component Value Date   HGBA1C 5.0 04/21/2019   MPG 96.8 04/21/2019   MPG 105 09/15/2014   Lab Results  Component Value Date   PROLACTIN 25.0 (H) 04/21/2019   PROLACTIN 2.7 (L) 03/14/2016   Lab Results  Component Value Date   CHOL 151 04/21/2019   TRIG 123 04/21/2019   HDL 45 04/21/2019   CHOLHDL 3.4 04/21/2019   VLDL 25 04/21/2019   LDLCALC 81 04/21/2019   LDLCALC 113 (H) 05/31/2015    Physical Findings: AIMS:  , ,  ,  ,    CIWA:    COWS:     Musculoskeletal: Strength & Muscle Tone: within normal limits Gait & Station: normal Patient leans: N/A  Psychiatric Specialty Exam: Physical Exam  ROS  Blood pressure 105/83, pulse (!) 103, temperature 98.4 F (36.9 C), temperature source Oral, resp. rate 18, height 5\' 4"  (1.626 m), weight 68 kg, SpO2 100 %.Body mass index is 25.73 kg/m.  General Appearance: Casual  Eye Contact:  Good  Speech:  Clear and Coherent  Volume:  Normal  Mood:  Euthymic  Affect:  Appropriate and Congruent  Thought Process:  Coherent and Descriptions of Associations: Circumstantial  Orientation:  Full (Time, Place, and Person)  Thought Content:  Logical  Suicidal Thoughts:  No  Homicidal Thoughts:  No  Memory:  Immediate;   Good  Judgement:  Good  Insight:  Good  Psychomotor Activity:  NA and Normal  Concentration:  Concentration: Good and Attention Span: Good  Recall:  Good  Fund of Knowledge:  Good  Language:  Good  Akathisia:  Negative  Handed:  Right  AIMS (if indicated):     Assets:  Physical Health Resilience  ADL's:  Intact  Cognition:  WNL  Sleep:  Number of Hours: 9     Treatment Plan Summary: Daily contact with patient to assess and evaluate symptoms and progress in treatment and Medication management  Continue current 15-minute checks for safety continue current precautions continue but escalate Risperdal to 3 mg twice a day and has long-acting  injectable on board probable discharge in 2 to 3 days no change in precautions or other meds  Levone Otten, MD 04/22/2019, 8:11 AM

## 2019-04-22 NOTE — Progress Notes (Signed)
D:Pt reports passive si and hi thoughts related to voices. Pt says that he hears whispers and they are decreasing in intensity. Pt was seen masturbating on the hall by the telephone. MHT discussed inappropriateness of situation with pt.  A:Offered support and 15 minute checks. R:Pt contracts with staff for safety. Safety maintained on the unit.

## 2019-04-23 MED ORDER — RISPERIDONE 3 MG PO TABS: 6.0000 mg | ORAL_TABLET | Freq: Every day | ORAL | Status: DC

## 2019-04-23 MED ORDER — RISPERIDONE 3 MG PO TABS: 6.0000 mg | ORAL_TABLET | Freq: Every day | ORAL | 2 refills | Status: DC

## 2019-04-23 MED ORDER — BENZTROPINE MESYLATE 0.5 MG PO TABS: 0.5000 mg | ORAL_TABLET | Freq: Two times a day (BID) | ORAL | 2 refills | Status: DC

## 2019-04-23 MED ORDER — TRAZODONE HCL 50 MG PO TABS: 50.0000 mg | ORAL_TABLET | Freq: Every evening | ORAL | 1 refills | Status: DC | PRN

## 2019-04-23 NOTE — Progress Notes (Signed)
Pt d/c from the hospital. All items returned. D/c instructions given and prescriptions given. Pt denies si and hi.

## 2019-04-23 NOTE — Progress Notes (Signed)
Recreation Therapy Notes  INPATIENT RECREATION TR PLAN  Patient Details Name: Jeffrey Coleman MRN: [id number] DOB: 03-21-1981 Today's Date: 04/23/2019  Rec Therapy Plan Is patient appropriate for Therapeutic Recreation?: Yes Treatment times per week: about 3 days Estimated Length of Stay: 5-7 days TR Treatment/Interventions: Group participation (Comment)  Discharge Criteria Pt will be discharged from therapy if:: Discharged Treatment plan/goals/alternatives discussed and agreed upon by:: Patient/family  Discharge Summary Short term goals set: See patient care plan Short term goals met: Complete Progress toward goals comments: Groups attended Which groups?: Anger management, Other (Comment)(Team building) Reason goals not met: None Therapeutic equipment acquired: N/A Reason patient discharged from therapy: Discharge from hospital Pt/family agrees with progress & goals achieved: Yes Date patient discharged from therapy: 04/23/19     Victorino Sparrow, LRT/CTRS  Ria Comment, [place] 04/23/2019, 9:12 AM

## 2019-04-23 NOTE — [facility] Suicide Risk Assessment (Signed)
[facility] INPATIENT:  Family/Significant Other Suicide Prevention Education  Suicide Prevention Education:  Education Completed; Pt's mother, Jeffrey Coleman, has been identified by the patient as the family member/significant other with whom the patient will be residing, and identified as the person(s) who will aid the patient in the event of a mental health crisis (suicidal ideations/suicide attempt).  With written consent from the patient, the family member/significant other has been provided the following suicide prevention education, prior to the and/or following the discharge of the patient.  The suicide prevention education provided includes the following:  Suicide risk factors  Suicide prevention and interventions  National Suicide Hotline telephone number  [facility] assessment telephone number  [facility] Emergency Assistance [place] and/or Residential Mobile Crisis Unit telephone number  Request made of family/significant other to:  Remove weapons (e.g., guns, rifles, knives), all items previously/currently identified as safety concern.    Remove drugs/medications (over-the-counter, prescriptions, illicit drugs), all items previously/currently identified as a safety concern.  The family member/significant other verbalizes understanding of the suicide prevention education information provided.  The family member/significant other agrees to remove the items of safety concern listed above.   CSW contacted pt's mother, Jeffrey Coleman. Pt's mother stated that she does not have any concerns or questions. Pt's mother stated that putting him in a lyft will work for her.   Trecia Rogers 04/23/2019, 9:00 AM

## 2019-04-23 NOTE — Progress Notes (Signed)
Patient ID: Jeffrey Coleman, male   DOB: 1981-02-11, 38 y.o.   MRN: [id number]  CSW scheduled a kaizen lyft for 10am for patient. CSW notified pt's nurse.

## 2019-04-23 NOTE — Discharge Summary (Signed)
Physician Discharge Summary Note  Patient:  Jeffrey Coleman is an 38 y.o., male MRN:  [id number] DOB:  1980/09/21  Patient phone:  [phone] (home)   Patient address:   [place] [address],   Total Time spent with patient: Greater than 30 minutes  Date of Admission:  04/20/2019  Date of Discharge: 04/23/19  Reason for Admission: Auditory hallucinations, command in nature.   Principal Problem: Schizophrenia, paranoid [facility])  Discharge Diagnoses: Patient Active Problem List   Diagnosis Date Noted  . Schizophrenia, paranoid ([place]) [F20.0] 05/31/2015    Priority: High  . Schizophrenia ([place]) [F20.9] 04/20/2019  . Cannabis use disorder, moderate, dependence ([place]) [F12.20] 03/14/2016  . Tobacco use disorder [F17.200] 08/22/2014   Past Psychiatric History: Per chart review: Multitude inpatient psychiatric hospitalizations in the [facility] health systems.  Past Medical History:  Past Medical History:  Diagnosis Date  . Anxiety   . Depression   . Schizo affective schizophrenia ([place])    History reviewed. No pertinent surgical history. Family History:  Family History  Problem Relation Age of Onset  . Cancer Father    Family Psychiatric History: See H&P  Tobacco Screening: Hx, smokes about half a pack a day  Social History:  Social History   Substance and Sexual Activity  Alcohol Use Yes   Comment: occasional     Social History   Substance and Sexual Activity  Drug Use Yes  . Types: Marijuana, Cocaine   Comment: crack - yesterday    Social History   Socioeconomic History  . Marital status: Single    Spouse name: Not on file  . Number of children: Not on file  . Years of education: Not on file  . Highest education level: Not on file  Occupational History  . Not on file  Social Needs  . Financial resource strain: Not on file  . Food insecurity    Worry: Not on file    Inability: Not on file  . Transportation needs    Medical: Not on file     Non-medical: Not on file  Tobacco Use  . Smoking status: Current Every Day Smoker    Packs/day: 1.00    Years: 15.00    Pack years: 15.00    Types: Cigarettes  . Smokeless tobacco: Never Used  Substance and Sexual Activity  . Alcohol use: Yes    Comment: occasional  . Drug use: Yes    Types: Marijuana, Cocaine    Comment: crack - yesterday  . Sexual activity: Not on file  Lifestyle  . Physical activity    Days per week: Not on file    Minutes per session: Not on file  . Stress: Not on file  Relationships  . Social Herbalist on phone: Not on file    Gets together: Not on file    Attends religious service: Not on file    Active member of club or organization: Not on file    Attends meetings of clubs or organizations: Not on file    Relationship status: Not on file  Other Topics Concern  . Not on file  Social History Narrative  . Not on file   Hospital Course: (Per Md's admission evaluation): This is the latest of multiple psychiatric admissions, the first here since 2016 for Mr. Heckmann, 38 year old patient who is thought to have a schizophrenic condition versus schizoaffective disorder bipolar type.  His case is complicated by intermittent compliance and cannabis abuse as well  as cocaine abuse.  We do not have a current drug screen. He presented on 8/17 complaining of noncompliance x6 months, auditory hallucinations telling him to kill others or be killed by police officers, reporting a 3-day exacerbation in these hallucinations. In the past he had done well with long-acting injectable Abilify he been involved with an act team in the [place] area, he lives in [place], however again is been lost to follow-up recently. He has past episodes of self-harm. He is a daily cannabis user and an occasional cocaine user states he smoked cocaine on the date of 8/17 for the first time in over a year. Other stressors included feeling isolated where he lives, no social  contacts, frustration at his drug use. Currently he is cooperative alert and oriented to person place situation time reports recent but no current auditory hallucinations recent but no current thoughts of harming self contracting here.  Describes voices both inside and outside his head, denies visual hallucinations.  This is one of several discharge summaries for Abdul from this  systems. All his inpatient psychiatric admissions/treatments were all related to worsening psychosis & suicidal ideations warranting recommendations for evaluation & mood stabilization treatments.   After the above admission evaluation, Teandre's symptoms were identified. The medication regimen targeting those symptoms were discussed & initiated with his consent. He was medicated, stabilized & discharged on the medications as listed on the discharge medication lists. He was also enrolled & participated in the group counseling sessions being offered & held on this unit. He learned coping skills. Although with a BAL of 21, Koston was not presenting with any substance withdrawal symptoms, as a result, did not receive any detoxification treatments.  As listed on the discharge medication lists, Kenyen was medicated, stabilized & discharged on 2 separate antipsychotic medications (Abilify monthly injectable & Risperdal tablets). This is because his symptoms has failed to respond to an antipsychotic monotherapy on several occasions warranting the need to treat & stabilize his symptoms with two separate antipsychotic medications. At this point, it is essential & will benefit Mieczyslaw to continue on these combination antipsychotic therapies as recommended. This is for him to achieve/maintain maximum symptom control. However, in the event that his symptoms happen to improve or decrease, he can then be titrated down to an antipsychotic monotherapy.This is to reduce the chances of developing metabolic syndrome associated with use of  multiple antipsychotic therapies. This has to be done within the proper monitoring, evaluation & judgement of his outpatient psychiatric provider.  Shanute's symptoms has stabilized. He is currently mentally & medically stable to be discharged to continue mental health care & medication management on an outpatient basis as noted below. He is provided with all the necessary information needed to make these appointments without problems. Upon discharge, adamantly denies any SIHI, AVH, delusional thoughts or paranoia. He was provided with a 7 days worth supply samples of his [facility] discharge medications including prescriptions. He left [facility] with all personal belongings in no apparent distress. Transportation per [place].  Physical Findings: AIMS:  , ,  ,  ,    CIWA:    COWS:     Musculoskeletal: Strength & Muscle Tone: within normal limits Gait & Station: normal Patient leans: N/A  Psychiatric Specialty Exam: Physical Exam  Constitutional: He is oriented to person, place, and time. He appears well-developed and well-nourished.  HENT:  Head: Normocephalic and atraumatic.  Eyes: Conjunctivae and EOM are normal.  Neck: Normal range of motion.  Cardiovascular: Normal  rate.  Respiratory: No respiratory distress. He has no wheezes.  Genitourinary:    Genitourinary Comments: Deferred   Musculoskeletal: Normal range of motion.  Neurological: He is alert and oriented to person, place, and time.  Skin: Skin is warm.    Review of Systems  Constitutional: Negative.   HENT: Negative.   Eyes: Negative.   Respiratory: Negative.  Negative for cough, shortness of breath and wheezing.   Cardiovascular: Negative.  Negative for chest pain and palpitations.  Gastrointestinal: Negative.  Negative for vomiting.  Genitourinary: Negative.   Musculoskeletal: Negative.   Skin: Negative.   Neurological: Negative.  Negative for headaches.  Endo/Heme/Allergies: Negative.   Psychiatric/Behavioral: Positive  for depression (Stabilized with medication prior to discharge), hallucinations (Hx. Psychosis (stable)) and substance abuse (Hx. alcohol & Cocaine use disorders). Negative for memory loss and suicidal ideas. The patient has insomnia (Stabilized with medication prior to discharge). The patient is not nervous/anxious (Stable).     Blood pressure (!) 132/96, pulse 71, temperature 97.6 F (36.4 C), temperature source Oral, resp. rate 18, height 5\' 4"  (1.626 m), weight 68 kg, SpO2 100 %.Body mass index is 25.73 kg/m.  See Md's discharge SRA     Has this patient used any form of tobacco in the last 30 days? (Cigarettes, Smokeless Tobacco, Cigars, and/or Pipes): N/A  Blood Alcohol level:  Lab Results  Component Value Date   ETH 21 (H) 04/20/2019   ETH <5 95/05/3266   Metabolic Disorder Labs:  Lab Results  Component Value Date   HGBA1C 5.0 04/21/2019   MPG 96.8 04/21/2019   MPG 105 09/15/2014   Lab Results  Component Value Date   PROLACTIN 25.0 (H) 04/21/2019   PROLACTIN 2.7 (L) 03/14/2016   Lab Results  Component Value Date   CHOL 151 04/21/2019   TRIG 123 04/21/2019   HDL 45 04/21/2019   CHOLHDL 3.4 04/21/2019   VLDL 25 04/21/2019   LDLCALC 81 04/21/2019   LDLCALC 113 (H) 05/31/2015   See Psychiatric Specialty Exam and Suicide Risk Assessment completed by Attending Physician prior to discharge.  Discharge destination:  Home  Is patient on multiple antipsychotic therapies at discharge: Yes,   Do you recommend tapering to monotherapy for antipsychotics?  Yes   Has Patient had three or more failed trials of antipsychotic monotherapy by history:  Yes,   Antipsychotic medications that previously failed include:   1.  Abilify., 2.  Olanzapine. and 3.  Haldol.  Recommended Plan for Multiple Antipsychotic Therapies: Additional reason(s) for multiple antispychotic treatment:  Patient's symptoms has faliled to respond to an antipsychotic monotherapy on several occasions.  Allergies as  of 04/23/2019   No Known Allergies     Medication List    TAKE these medications     Indication  ARIPiprazole ER 400 MG Susr Commonly known as: Abilify Maintena Inject 400 mg into the muscle every 30 (thirty) days.  Indication: Schizophrenia   benztropine 0.5 MG tablet Commonly known as: COGENTIN Take 1 tablet (0.5 mg total) by mouth 2 (two) times daily. What changed:   medication strength  how much to take  when to take this  Indication: Extrapyramidal Reaction caused by Medications   levETIRAcetam 500 MG tablet Commonly known as: Keppra Take 1 tablet (500 mg total) by mouth 2 (two) times daily.  Indication: Seizure, Manic Phase of Manic-Depression   risperiDONE 3 MG tablet Commonly known as: RISPERDAL Take 2 tablets (6 mg total) by mouth at bedtime.  Indication: Schizophrenia   traZODone  50 MG tablet Commonly known as: DESYREL Take 1 tablet (50 mg total) by mouth at bedtime as needed for sleep. What changed:   medication strength  how much to take  when to take this  reasons to take this  Indication: [place] Follow up on 04/24/2019.   Why: Your ACTT services will continue Friday, 8/21 around 12:30p.  The office would like for you to contact them as soon as you discharge.  Contact information: [address] [place] [address] [phone]          Follow-up Recommendation: Activity:  As tolerated Diet: As recommended by your primary care doctor. Keep all scheduled follow-up appointments as recommended.  Comment: Patient is instructed prior to discharge to: Take all medications as prescribed by his/her mental healthcare provider. Report any adverse effects and or reactions from the medicines to his/her outpatient provider promptly. Patient has been instructed & cautioned: To not engage in alcohol and or illegal drug use while on prescription medicines. In the event of  worsening symptoms, patient is instructed to call the crisis hotline, 911 and or go to the nearest ED for appropriate evaluation and treatment of symptoms. To follow-up with his/her primary care provider for your other medical issues, concerns and or health care needs.   Signed: Lindell Spar, NP, PMHNP, FNP-BC 04/23/2019, 11:10 AM

## 2019-04-23 NOTE — BHH Suicide Risk Assessment (Signed)
[facility] Discharge Suicide Risk Assessment   Principal Problem: psychosis Discharge Diagnoses: Active Problems:   Schizophrenia ([place])   Total Time spent with patient: 45  Musculoskeletal: Strength & Muscle Tone: within normal limits Gait & Station: normal Patient leans: N/A  Psychiatric Specialty Exam: ROS  Blood pressure (!) 132/96, pulse 71, temperature 97.6 F (36.4 C), temperature source Oral, resp. rate 18, height 5\' 4"  (1.626 m), weight 68 kg, SpO2 100 %.Body mass index is 25.73 kg/m.  General Appearance: Casual  Eye Contact::  Good  Speech:  Clear and [id number]  Volume:  Normal  Mood:  Euthymic  Affect:  Full Range  Thought Process:  Coherent  Orientation:  Full (Time, Place, and Person)  Thought Content:  Tangential  Suicidal Thoughts:  No  Homicidal Thoughts:  No  Memory:  Immediate;   Fair Recent;   Good Remote;   Fair  Judgement:  Good  Insight:  Good  Psychomotor Activity:  Normal  Concentration:  Good  Recall:  Good  Fund of Knowledge:Good  Language: Good  Akathisia:  Negative  Handed:  Right  AIMS (if indicated):     Assets:  Communication Skills Desire for Improvement  Sleep:  Number of Hours: 7.75  Cognition: WNL  ADL's:  Intact   Mental Status Per Nursing Assessment::   On Admission:  NA  Demographic Factors:  Male and Low socioeconomic status  Loss Factors: Decrease in vocational status  Historical Factors: Impulsivity  Risk Reduction Factors:   Sense of responsibility to family and Religious beliefs about death  Continued Clinical Symptoms:  Alcohol/Substance Abuse/Dependencies  Cognitive Features That Contribute To Risk:  None    Suicide Risk:  Minimal: No identifiable suicidal ideation.  Patients presenting with no risk factors but with morbid ruminations; may be classified as minimal risk based on the severity of the depressive symptoms  Follow-up Information    [facility] Follow up.   Contact  information: [address] [place] [address] [phone]           Plan Of Care/Follow-up recommendations:  Activity:  full  Jennessy Sandridge, MD 04/23/2019, 8:24 AM

## 2019-04-23 NOTE — Progress Notes (Signed)
  [facility] Adult Case Management Discharge Plan :  Will you be returning to the same living situation after discharge:  Yes,  home with mother At discharge, do you have transportation home?: Yes,  [employer] at 10am Do you have the ability to pay for your medications: Yes,  medicare  Release of information consent forms completed and in the chart;  Patient's signature needed at discharge.  Patient to Follow up at: Follow-up [place] Follow up on 04/24/2019.   Why: Your ACTT services will continue Friday, 8/21 around 12:30p.  The office would like for you to contact them as soon as you discharge.  Contact information: [place] [address] [phone]           Next level of care provider has access to [place] and Suicide Prevention discussed: Yes,  pt's mother     Has patient been referred to the Quitline?: Patient refused referral  Patient has been referred for addiction treatment: Yes  Trecia Rogers, LCSW 04/23/2019, 9:13 AM

## 2019-04-23 NOTE — Plan of Care (Signed)
Pt was able to identify coping skills at completion of recreation therapy group sessions.   Cortney Mckinney, LRT/CTRS 

## 2019-04-23 NOTE — Plan of Care (Signed)
Discharge note  Patient verbalizes readiness for discharge. Follow up plan explained, AVS, Transition record and SRA given. Prescriptions and teaching provided. Belongings returned and signed for. Suicide safety plan completed and signed. Patient verbalizes understanding. Patient denies SI/HI and assures this Probation officer he will seek assistance should that change. Patient discharged to lobby where pt was instructed to wait for his Lyft driver.  Problem: Education: Goal: Utilization of techniques to improve thought processes will improve Outcome: Adequate for Discharge Goal: Knowledge of the prescribed therapeutic regimen will improve Outcome: Adequate for Discharge   Problem: Activity: Goal: Interest or engagement in leisure activities will improve Outcome: Adequate for Discharge Goal: Imbalance in normal sleep/wake cycle will improve Outcome: Adequate for Discharge   Problem: Coping: Goal: Coping ability will improve Outcome: Adequate for Discharge Goal: Will verbalize feelings Outcome: Adequate for Discharge   Problem: Health Behavior/Discharge Planning: Goal: Ability to make decisions will improve Outcome: Adequate for Discharge Goal: Compliance with therapeutic regimen will improve Outcome: Adequate for Discharge   Problem: Role Relationship: Goal: Will demonstrate positive changes in social behaviors and relationships Outcome: Adequate for Discharge   Problem: Safety: Goal: Ability to disclose and discuss suicidal ideas will improve Outcome: Adequate for Discharge Goal: Ability to identify and utilize support systems that promote safety will improve Outcome: Adequate for Discharge   Problem: Self-Concept: Goal: Will verbalize positive feelings about self Outcome: Adequate for Discharge Goal: Level of anxiety will decrease Outcome: Adequate for Discharge   Problem: Education: Goal: Knowledge of [place] General Education information/materials will improve Outcome:  Adequate for Discharge Goal: Emotional status will improve Outcome: Adequate for Discharge Goal: Mental status will improve Outcome: Adequate for Discharge Goal: Verbalization of understanding the information provided will improve Outcome: Adequate for Discharge   Problem: Activity: Goal: Interest or engagement in activities will improve Outcome: Adequate for Discharge Goal: Sleeping patterns will improve Outcome: Adequate for Discharge   Problem: Coping: Goal: Ability to verbalize frustrations and anger appropriately will improve Outcome: Adequate for Discharge Goal: Ability to demonstrate self-control will improve Outcome: Adequate for Discharge   Problem: Health Behavior/Discharge Planning: Goal: Identification of resources available to assist in meeting health care needs will improve Outcome: Adequate for Discharge Goal: Compliance with treatment plan for underlying cause of condition will improve Outcome: Adequate for Discharge   Problem: Physical Regulation: Goal: Ability to maintain clinical measurements within normal limits will improve Outcome: Adequate for Discharge   Problem: Safety: Goal: Periods of time without injury will increase Outcome: Adequate for Discharge   Problem: Activity: Goal: Will verbalize the importance of balancing activity with adequate rest periods Outcome: Adequate for Discharge   Problem: Education: Goal: Will be free of psychotic symptoms Outcome: Adequate for Discharge Goal: Knowledge of the prescribed therapeutic regimen will improve Outcome: Adequate for Discharge   Problem: Coping: Goal: Coping ability will improve Outcome: Adequate for Discharge Goal: Will verbalize feelings Outcome: Adequate for Discharge   Problem: Health Behavior/Discharge Planning: Goal: Compliance with prescribed medication regimen will improve Outcome: Adequate for Discharge   Problem: Nutritional: Goal: Ability to achieve adequate nutritional intake  will improve Outcome: Adequate for Discharge   Problem: Role Relationship: Goal: Ability to communicate needs accurately will improve Outcome: Adequate for Discharge Goal: Ability to interact with others will improve Outcome: Adequate for Discharge   Problem: Safety: Goal: Ability to redirect hostility and anger into socially appropriate behaviors will improve Outcome: Adequate for Discharge Goal: Ability to remain free from injury will improve Outcome: Adequate for Discharge  Problem: Self-Care: Goal: Ability to participate in self-care as condition permits will improve Outcome: Adequate for Discharge   Problem: Self-Concept: Goal: Will verbalize positive feelings about self Outcome: Adequate for Discharge

## 2020-11-10 ENCOUNTER — Other Ambulatory Visit: Payer: Self-pay

## 2020-11-10 ENCOUNTER — Encounter (HOSPITAL_COMMUNITY): Payer: Self-pay | Admitting: *Deleted

## 2020-11-10 ENCOUNTER — Emergency Department (HOSPITAL_COMMUNITY)
Admission: EM | Admit: 2020-11-10 | Discharge: 2020-11-10 | Disposition: A | Discharge status: Home / Self Care | Payer: Medicaid Other | Attending: Emergency Medicine | Admitting: Emergency Medicine

## 2020-11-10 DIAGNOSIS — Z760 Encounter for issue of repeat prescription: Secondary | ICD-10-CM | POA: Insufficient documentation

## 2020-11-10 DIAGNOSIS — F17210 Nicotine dependence, cigarettes, uncomplicated: Secondary | ICD-10-CM | POA: Insufficient documentation

## 2020-11-10 MED ORDER — BENZTROPINE MESYLATE 0.5 MG PO TABS: 0.5000 mg | ORAL_TABLET | Freq: Two times a day (BID) | ORAL | 0 refills | Status: DC

## 2020-11-10 MED ORDER — ARIPIPRAZOLE 10 MG PO TABS: 10.0000 mg | ORAL_TABLET | Freq: Every day | ORAL | 0 refills | Status: DC

## 2020-11-10 NOTE — ED Provider Notes (Signed)
[facility] Provider Note   CSN: [id number] Arrival date & time: 11/10/20  1552     History Chief Complaint  Patient presents with  . Medication Refill    Jeffrey Coleman is a 40 y.o. male.  HPI   Patient with significant medical history of anxiety, depression, schizoaffective disorder presents with chief complaint of medication refill.  He endorses that he was recently released from prison  and has been without his medications.  Patient states he has been seeing a counselor at the health department but because his prescriptions are up he is unable to get more medications.  He states he has been without them for last couple days.  He denies hallucinations, delusions, suicidal homicidal ideations.  He has no other complaints at this time.  After reviewing patient's chart he was last admitted to the psych unit 2020, he was  taking Abilify and Cogentin.  Patient denies headaches, fevers, chills, shortness of breath, chest pain, abdominal pain, nausea, vomiting, diarrhea, worsening pedal edema.  Past Medical History:  Diagnosis Date  . Anxiety   . Depression   . Schizo affective schizophrenia [facility])     Patient Active Problem List   Diagnosis Date Noted  . Schizophrenia ([place]) 04/20/2019  . Cannabis use disorder, moderate, dependence ([place]) 03/14/2016  . Schizophrenia, paranoid ([place]) 05/31/2015  . Tobacco use disorder 08/22/2014    History reviewed. No pertinent surgical history.     Family History  Problem Relation Age of Onset  . Cancer Father     Social History   Tobacco Use  . Smoking status: Current Every Day Smoker    Packs/day: 1.00    Years: 15.00    Pack years: 15.00    Types: Cigarettes  . Smokeless tobacco: Never Used  Substance Use Topics  . Alcohol use: Yes    Comment: occasional  . Drug use: Yes    Types: Marijuana, Cocaine    Comment: crack - yesterday    Home Medications Prior to Admission medications   Medication Sig Start  Date End Date Taking? Authorizing Provider  ARIPiprazole (ABILIFY) 10 MG tablet Take 1 tablet (10 mg total) by mouth daily. 11/10/20 12/10/20 Yes Marcello Fennel, PA-C  benztropine (COGENTIN) 0.5 MG tablet Take 1 tablet (0.5 mg total) by mouth 2 (two) times daily. 11/10/20 12/10/20 Yes Marcello Fennel, PA-C  ARIPiprazole ER (ABILIFY MAINTENA) 400 MG SUSR Inject 400 mg into the muscle every 30 (thirty) days. Patient not taking: Reported on 04/21/2019 03/15/16   Hildred Priest, MD  benztropine (COGENTIN) 0.5 MG tablet Take 1 tablet (0.5 mg total) by mouth 2 (two) times daily. 04/23/19   Johnn Hai, MD  levETIRAcetam (KEPPRA) 500 MG tablet Take 1 tablet (500 mg total) by mouth 2 (two) times daily. Patient not taking: Reported on 04/21/2019 10/01/16   Elnora Morrison, MD  risperiDONE (RISPERDAL) 3 MG tablet Take 2 tablets (6 mg total) by mouth at bedtime. 04/23/19   Johnn Hai, MD  traZODone (DESYREL) 50 MG tablet Take 1 tablet (50 mg total) by mouth at bedtime as needed for sleep. 04/23/19   Johnn Hai, MD    Allergies    Patient has no known allergies.  Review of Systems   Review of Systems  Constitutional: Negative for chills and fever.  HENT: Negative for congestion.   Respiratory: Negative for shortness of breath.   Cardiovascular: Negative for chest pain.  Gastrointestinal: Negative for abdominal pain.  Genitourinary: Negative for enuresis.  Musculoskeletal: Negative for  back pain.  Skin: Negative for rash.  Neurological: Negative for dizziness.  Hematological: Does not bruise/bleed easily.  Psychiatric/Behavioral: Negative for hallucinations and self-injury.    Physical Exam Updated Vital Signs BP 120/87   Pulse 75   Temp 98.1 F (36.7 C) (Oral)   Resp 18   Ht 5\' 4"  (1.626 m)   Wt 70.3 kg   SpO2 100%   BMI 26.61 kg/m   Physical Exam Vitals and nursing note reviewed.  Constitutional:      General: He is not in acute distress.    Appearance: He is not  ill-appearing.  HENT:     Head: Normocephalic and atraumatic.     Nose: No congestion.  Eyes:     Conjunctiva/sclera: Conjunctivae normal.  Cardiovascular:     Rate and Rhythm: Normal rate and regular rhythm.  Pulmonary:     Effort: Pulmonary effort is normal.  Musculoskeletal:     Comments: Moving all 4 extremities at difficulty.  Skin:    General: Skin is warm and dry.  Neurological:     Mental Status: He is alert.     Comments: No difficulty with word finding.  Psychiatric:        Mood and Affect: Mood normal.     ED Results / Procedures / Treatments   Labs (all labs ordered are listed, but only abnormal results are displayed) Labs Reviewed - No data to display  EKG None  Radiology No results found.  Procedures Procedures   Medications Ordered in ED Medications - No data to display  ED Course  I have reviewed the triage vital signs and the nursing notes.  Pertinent labs & imaging results that were available during my care of the patient were reviewed by me and considered in my medical decision making (see chart for details).    MDM Rules/Calculators/A&P                         Initial impression-patient presents with chief complaint of medication refill.  He is alert, does not appear in acute distress, vital signs reassuring.  Work-up-due to well-appearing patient, benign physical exam, further lab imaging not warranted at this time.  Rule out-low suspicion for psychiatric emergency as patient denies hallucinations, delusions, denies suicidal or homicidal ideations.  Respond appropriately. low  Suspicion for systemic infection as patient is nontoxic-appearing, vital signs reassuring, no obvious source of infection on my exam.  Low suspicion for ACS patient with chest pain, shortness of breath, no signs of hypoperfusion fluid overload noted on my exam.  Low suspicion for intra-abdominal abnormality as patient denies abdominal pain, tolerated p.o. without  difficulty.  Plan-provide patient 1 months prescription of his medications, referred him to behavioral health for further evaluation.  Vital signs have remained stable, no indication for hospital admission.   Patient given at home care as well strict return precautions.  Patient verbalized that they understood agreed to said plan.   Final Clinical Impression(s) / ED Diagnoses Final diagnoses:  Medication refill    Rx / DC Orders ED Discharge Orders         Ordered    ARIPiprazole (ABILIFY) 10 MG tablet  Daily        11/10/20 1724    benztropine (COGENTIN) 0.5 MG tablet  2 times daily        11/10/20 1724           Marcello Fennel, [place] 11/10/20 1758  Milton Ferguson, MD 11/13/20 1131

## 2020-11-10 NOTE — ED Triage Notes (Signed)
Released from prison recently. Out of abilify and cogentin

## 2020-11-10 NOTE — Discharge Instructions (Signed)
I have refilled your prescriptions.  Please take as prescribed.    Please follow-up with behavioral health for further evaluation.  Given contact information for [facility] behavioral health unit please call to schedule a follow-up appointment.  Come back to the emergency department if you develop chest pain, shortness of breath, severe abdominal pain, uncontrolled nausea, vomiting, diarrhea.

## 2020-12-28 ENCOUNTER — Other Ambulatory Visit: Payer: Self-pay

## 2020-12-28 ENCOUNTER — Encounter (HOSPITAL_COMMUNITY): Payer: Self-pay

## 2020-12-28 ENCOUNTER — Emergency Department (HOSPITAL_COMMUNITY)
Admission: EM | Admit: 2020-12-28 | Discharge: 2020-12-28 | Disposition: A | Discharge status: Home / Self Care | Payer: Medicaid Other | Attending: Emergency Medicine | Admitting: Emergency Medicine

## 2020-12-28 DIAGNOSIS — S59912A Unspecified injury of left forearm, initial encounter: Secondary | ICD-10-CM | POA: Diagnosis present

## 2020-12-28 DIAGNOSIS — F17210 Nicotine dependence, cigarettes, uncomplicated: Secondary | ICD-10-CM | POA: Diagnosis not present

## 2020-12-28 DIAGNOSIS — W540XXA Bitten by dog, initial encounter: Secondary | ICD-10-CM

## 2020-12-28 DIAGNOSIS — S51852A Open bite of left forearm, initial encounter: Secondary | ICD-10-CM | POA: Insufficient documentation

## 2020-12-28 DIAGNOSIS — Z2914 Encounter for prophylactic rabies immune globin: Secondary | ICD-10-CM | POA: Diagnosis not present

## 2020-12-28 DIAGNOSIS — Z23 Encounter for immunization: Secondary | ICD-10-CM | POA: Diagnosis not present

## 2020-12-28 MED ORDER — RABIES VACCINE, PCEC IM SUSR
1.0000 mL | Freq: Once | INTRAMUSCULAR | Status: AC
  Administered 2020-12-28: 1 mL via INTRAMUSCULAR
  Filled 2020-12-28: qty 1

## 2020-12-28 MED ORDER — TETANUS-DIPHTH-ACELL PERTUSSIS 5-2.5-18.5 LF-MCG/0.5 IM SUSY
0.5000 mL | PREFILLED_SYRINGE | Freq: Once | INTRAMUSCULAR | Status: AC
  Administered 2020-12-28: 0.5 mL via INTRAMUSCULAR
  Filled 2020-12-28: qty 0.5

## 2020-12-28 MED ORDER — AMOXICILLIN-POT CLAVULANATE 875-125 MG PO TABS: 1.0000 | ORAL_TABLET | Freq: Two times a day (BID) | ORAL | 0 refills | Status: AC

## 2020-12-28 MED ORDER — RABIES IMMUNE GLOBULIN 150 UNIT/ML IM INJ
20.0000 [IU]/kg | INJECTION | Freq: Once | INTRAMUSCULAR | Status: AC
  Administered 2020-12-28: 1425 [IU] via INTRAMUSCULAR
  Filled 2020-12-28: qty 10

## 2020-12-28 NOTE — ED Triage Notes (Signed)
Reports he got bit by a dog a few days ago and having pain in his left arm [place] and was told to come to the ER No broken skin or s/s infection

## 2020-12-28 NOTE — ED Provider Notes (Signed)
[facility] Provider Note   CSN: [id number] Arrival date & time: 12/28/20  1052     History Chief Complaint  Patient presents with  . Arm Pain    Jeffrey Coleman is a 40 y.o. male.  HPI 40 year old male with a history of anxiety, depression, schizoaffective schizophrenia presents to the ER with complaints of a dog bite to his left arm which occurred on Sunday.  He states that this he was bitten by a stray dog and is not able to find out the dog's rabies vaccination status.  He reports that initially the bite did not hurt but he has noticed some swelling to the arm over the last several days.  He has not taken any antibiotics for it.  No fevers or chills.  No numbness or tingling.    Past Medical History:  Diagnosis Date  . Anxiety   . Depression   . Schizo affective schizophrenia [facility])     Patient Active Problem List   Diagnosis Date Noted  . Schizophrenia ([place]) 04/20/2019  . Cannabis use disorder, moderate, dependence ([place]) 03/14/2016  . Schizophrenia, paranoid ([place]) 05/31/2015  . Tobacco use disorder 08/22/2014    History reviewed. No pertinent surgical history.     Family History  Problem Relation Age of Onset  . Cancer Father     Social History   Tobacco Use  . Smoking status: Current Every Day Smoker    Packs/day: 1.00    Years: 15.00    Pack years: 15.00    Types: Cigarettes  . Smokeless tobacco: Never Used  Substance Use Topics  . Alcohol use: Yes    Comment: occasional  . Drug use: Yes    Types: Marijuana, Cocaine    Comment: crack - yesterday    Home Medications Prior to Admission medications   Medication Sig Start Date End Date Taking? Authorizing Provider  amoxicillin-clavulanate (AUGMENTIN) 875-125 MG tablet Take 1 tablet by mouth 2 (two) times daily for 7 days. 12/28/20 01/04/21 Yes Garald Balding, PA-C  ARIPiprazole ER (ABILIFY MAINTENA) 400 MG SUSR Inject 400 mg into the muscle every 30 (thirty) days. 03/15/16  Yes  Hildred Priest, MD  benztropine (COGENTIN) 0.5 MG tablet Take 1 tablet (0.5 mg total) by mouth 2 (two) times daily. 04/23/19  Yes Johnn Hai, MD  ARIPiprazole (ABILIFY) 10 MG tablet Take 1 tablet (10 mg total) by mouth daily. 11/10/20 12/10/20  Marcello Fennel, PA-C  benztropine (COGENTIN) 0.5 MG tablet Take 1 tablet (0.5 mg total) by mouth 2 (two) times daily. 11/10/20 12/10/20  Marcello Fennel, PA-C  levETIRAcetam (KEPPRA) 500 MG tablet Take 1 tablet (500 mg total) by mouth 2 (two) times daily. Patient not taking: No sig reported 10/01/16   Elnora Morrison, MD  risperiDONE (RISPERDAL) 3 MG tablet Take 2 tablets (6 mg total) by mouth at bedtime. Patient not taking: No sig reported 04/23/19   Johnn Hai, MD  traZODone (DESYREL) 50 MG tablet Take 1 tablet (50 mg total) by mouth at bedtime as needed for sleep. Patient not taking: No sig reported 04/23/19   Johnn Hai, MD    Allergies    Patient has no known allergies.  Review of Systems   Review of Systems  Musculoskeletal: Positive for arthralgias.  Skin: Positive for color change and wound.    Physical Exam Updated Vital Signs BP 122/88 (BP Location: Left Arm)   Pulse 68   Temp 98.1 F (36.7 C) (Oral)   Resp 16  Ht 5\' 4"  (1.626 m)   Wt 70.3 kg   SpO2 99%   BMI 26.61 kg/m   Physical Exam Vitals and nursing note reviewed.  Constitutional:      Appearance: He is well-developed.  HENT:     Head: Normocephalic and atraumatic.  Eyes:     Conjunctiva/sclera: Conjunctivae normal.  Cardiovascular:     Rate and Rhythm: Normal rate and regular rhythm.     Heart sounds: No murmur heard.   Pulmonary:     Effort: Pulmonary effort is normal. No respiratory distress.     Breath sounds: Normal breath sounds.  Abdominal:     Palpations: Abdomen is soft.     Tenderness: There is no abdominal tenderness.  Musculoskeletal:     Cervical back: Neck supple.     Comments: Left forearm with multiple healed over puncture  wounds.  No significant warmth or erythema though some global swelling to the forearm is noted.  He has 2+ radial pulses in the left hand.  Full flexion extension of the wrist.  No visible abscesses, drainage  Skin:    General: Skin is warm and dry.  Neurological:     Mental Status: He is alert.     ED Results / Procedures / Treatments   Labs (all labs ordered are listed, but only abnormal results are displayed) Labs Reviewed - No data to display  EKG None  Radiology No results found.  Procedures Procedures   Medications Ordered in ED Medications  rabies immune globulin (HYPERAB/KEDRAB) injection 1,425 Units (1,425 Units Intramuscular Given 12/28/20 1146)  rabies vaccine (RABAVERT) injection 1 mL (1 mL Intramuscular Given 12/28/20 1120)  Tdap (BOOSTRIX) injection 0.5 mL (0.5 mLs Intramuscular Given 12/28/20 1122)    ED Course  I have reviewed the triage vital signs and the nursing notes.  Pertinent labs & imaging results that were available during my care of the patient were reviewed by me and considered in my medical decision making (see chart for details).    MDM Rules/Calculators/A&P                          Patient presents with dog bite to the left forearm.  Patient was bitten by a stray dog with a no known rabies status, animal control not be able to quarantine the dog.  Patient received initial rabies immunoglobulin and vaccine, will be given information for follow-up for further vaccines.  Will start on Augmentin.  Discussed return precautions.  No signs of systemic infection, compartment syndrome.  Stable for discharge. Final Clinical Impression(s) / ED Diagnoses Final diagnoses:  Dog bite, initial encounter    Rx / DC Orders ED Discharge Orders         Ordered    amoxicillin-clavulanate (AUGMENTIN) 875-125 MG tablet  2 times daily        12/28/20 1157    Administer rabies vaccine      12/28/20 1122           Lyndel Safe 12/28/20 1159     Davonna Belling, MD 12/28/20 1536

## 2020-12-28 NOTE — Discharge Instructions (Addendum)
Please make sure to take the prescribed antibiotics as directed until finished.  You will be given instructions on what days to follow-up with additional rabies vaccines, please go to the [facility] urgent care for this.  Please return to the ER for worsening fevers, pain, redness, swelling, drainage or any other new or concerning symptoms.  Please make sure to follow-up with your primary care doctor.

## 2021-04-15 ENCOUNTER — Emergency Department (HOSPITAL_COMMUNITY): Payer: Medicaid Other

## 2021-04-15 ENCOUNTER — Emergency Department (HOSPITAL_COMMUNITY)
Admission: EM | Admit: 2021-04-15 | Discharge: 2021-04-15 | Disposition: A | Discharge status: Home / Self Care | Payer: Medicaid Other | Attending: Emergency Medicine | Admitting: Emergency Medicine

## 2021-04-15 ENCOUNTER — Other Ambulatory Visit: Payer: Self-pay

## 2021-04-15 ENCOUNTER — Encounter (HOSPITAL_COMMUNITY): Payer: Self-pay | Admitting: Emergency Medicine

## 2021-04-15 DIAGNOSIS — M79671 Pain in right foot: Secondary | ICD-10-CM | POA: Diagnosis not present

## 2021-04-15 DIAGNOSIS — F17210 Nicotine dependence, cigarettes, uncomplicated: Secondary | ICD-10-CM | POA: Diagnosis not present

## 2021-04-15 MED ORDER — IBUPROFEN 600 MG PO TABS: 600.0000 mg | ORAL_TABLET | Freq: Three times a day (TID) | ORAL | 0 refills | Status: AC | PRN

## 2021-04-15 NOTE — Discharge Instructions (Addendum)
Your x-rays are negative for acute injury in your foot, no sign of fracture or dislocation.  Wear the postop shoe for comfort, use elevation and a heating pad applied to your area of pain for 20 minutes several times daily.  You have been prescribed an anti-inflammatory pain reliever.  Get rechecked by your primary doctor if your symptoms are not improving with this plan over the next week.

## 2021-04-15 NOTE — ED Provider Notes (Addendum)
[facility] EMERGENCY DEPARTMENT Provider Note   CSN: [id number] Arrival date & time: 04/15/21  1049     History Chief Complaint  Patient presents with   Foot Pain    Jeffrey Coleman is a 39 y.o. male with history of anxiety, depression and schizophrenia presenting for evaluation of chronic right foot pain localizing to his MTP of his great toe since he accidentally dropped a cinder block on it about 3 weeks ago.  He was using the block for weight lifting activities when this injury occurred.  He was seen at an urgent care the day of the injury at which time he states imaging was negative for acute fracture.  He has taken [facility] powders with no relief in his pain.  His pain is worse when he is trying to put his feet into his shoes and with significant bending of the joint.  He denies any other pain in his foot.  He denies history of gout.  The history is provided by the patient.      Past Medical History:  Diagnosis Date   Anxiety    Depression    Schizo affective schizophrenia [facility])     Patient Active Problem List   Diagnosis Date Noted   Schizophrenia ([place]) 04/20/2019   Cannabis use disorder, moderate, dependence ([place]) 03/14/2016   Schizophrenia, paranoid ([place]) 05/31/2015   Tobacco use disorder 08/22/2014    History reviewed. No pertinent surgical history.     Family History  Problem Relation Age of Onset   Cancer Father     Social History   Tobacco Use   Smoking status: Every Day    Packs/day: 1.00    Years: 15.00    Pack years: 15.00    Types: Cigarettes   Smokeless tobacco: Never  Substance Use Topics   Alcohol use: Yes    Comment: occasional   Drug use: Yes    Types: Marijuana, Cocaine    Comment: crack - yesterday    Home Medications Prior to Admission medications   Medication Sig Start Date End Date Taking? Authorizing Provider  ibuprofen (ADVIL) 600 MG tablet Take 1 tablet (600 mg total) by mouth every 8 (eight) hours as needed for moderate pain.  04/15/21  Yes Estreya Clay, Almyra Free, PA-C  ARIPiprazole (ABILIFY) 10 MG tablet Take 1 tablet (10 mg total) by mouth daily. 11/10/20 12/10/20  Marcello Fennel, PA-C  ARIPiprazole ER (ABILIFY MAINTENA) 400 MG SUSR Inject 400 mg into the muscle every 30 (thirty) days. 03/15/16   Hildred Priest, MD  benztropine (COGENTIN) 0.5 MG tablet Take 1 tablet (0.5 mg total) by mouth 2 (two) times daily. 04/23/19   Johnn Hai, MD  benztropine (COGENTIN) 0.5 MG tablet Take 1 tablet (0.5 mg total) by mouth 2 (two) times daily. 11/10/20 12/10/20  Marcello Fennel, PA-C  levETIRAcetam (KEPPRA) 500 MG tablet Take 1 tablet (500 mg total) by mouth 2 (two) times daily. Patient not taking: No sig reported 10/01/16   Elnora Morrison, MD  risperiDONE (RISPERDAL) 3 MG tablet Take 2 tablets (6 mg total) by mouth at bedtime. Patient not taking: No sig reported 04/23/19   Johnn Hai, MD  traZODone (DESYREL) 50 MG tablet Take 1 tablet (50 mg total) by mouth at bedtime as needed for sleep. Patient not taking: No sig reported 04/23/19   Johnn Hai, MD    Allergies    Patient has no known allergies.  Review of Systems   Review of Systems  Constitutional:  Negative for fever.  Musculoskeletal:  Positive for arthralgias. Negative for joint swelling and myalgias.  Neurological:  Negative for weakness and numbness.   Physical Exam Updated Vital Signs BP 131/86   Pulse 77   Temp 98 F (36.7 C) (Oral)   Resp 18   Ht '5\' 4"'$  (1.626 m)   Wt 70.3 kg   SpO2 99%   BMI 26.61 kg/m   Physical Exam Constitutional:      Appearance: He is well-developed.  HENT:     Head: Atraumatic.  Cardiovascular:     Comments: Pulses equal bilaterally Musculoskeletal:        General: Tenderness present. No swelling or deformity.     Cervical back: Normal range of motion.     Right foot: Normal capillary refill. Bony tenderness present. No swelling, deformity or bunion.     Comments: Ttp right great mtp joint, no edema or erythema.  No  deformity.  Distal sensation intact with less than 2 sec cap refill.   Skin:    General: Skin is warm and dry.     Findings: No erythema or lesion.  Neurological:     Mental Status: He is alert.     Sensory: No sensory deficit.     Motor: No weakness.     Deep Tendon Reflexes: Reflexes normal.    ED Results / Procedures / Treatments   Labs (all labs ordered are listed, but only abnormal results are displayed) Labs Reviewed - No data to display  EKG None  Radiology DG Foot Complete Right  Result Date: 04/15/2021 CLINICAL DATA:  Trauma 3 weeks ago.  Pain. EXAM: RIGHT FOOT COMPLETE - 3+ VIEW COMPARISON:  None. FINDINGS: There is no evidence of fracture or dislocation. There is no evidence of arthropathy or other focal bone abnormality. Soft tissues are unremarkable. IMPRESSION: Negative. Electronically Signed   By: Dorise Bullion III M.D.   On: 04/15/2021 12:13    Procedures Procedures   Medications Ordered in ED Medications - No data to display  ED Course  I have reviewed the triage vital signs and the nursing notes.  Pertinent labs & imaging results that were available during my care of the patient were reviewed by me and considered in my medical decision making (see chart for details).    MDM Rules/Calculators/A&P                           Imaging reviewed and discussed with patient.  He had a direct blow from a cinder block weeks ago with persistent pain.  I suspect he is got a bone bruise but there is no evidence for fracture or dislocation.  There is no erythema or swelling of this toe or joint, doubt gout, patient does not have a history of gout.  He was placed in a postop shoe, discussed heat therapy, ibuprofen prescribed plan follow-up with his PCP for recheck in a week if symptoms persist.  Patient deferred postop shoe, order canceled. Final Clinical Impression(s) / ED Diagnoses Final diagnoses:  Foot pain, right    Rx / DC Orders ED Discharge Orders           Ordered    ibuprofen (ADVIL) 600 MG tablet  Every 8 hours PRN        04/15/21 1233             Evalee Jefferson, PA-C 04/15/21 1238    Evalee Jefferson, PA-C 04/15/21 1253    Daleen Bo, MD  04/17/21 1118  

## 2021-04-15 NOTE — ED Notes (Signed)
Pt not in room.

## 2021-04-15 NOTE — ED Notes (Signed)
Pt ambulatory to triage and to room.

## 2021-04-15 NOTE — ED Triage Notes (Signed)
Pt c/o right foot pain after dropping cinder block on it 3 weeks ago. Pt states only hurts when putting shoe on foot. Seen at [facility] for same and told him "to walk it off." Pt says xrays were done at [facility]

## 2021-05-14 ENCOUNTER — Emergency Department (HOSPITAL_COMMUNITY): Payer: Medicaid Other

## 2021-05-14 ENCOUNTER — Emergency Department (HOSPITAL_COMMUNITY)
Admission: EM | Admit: 2021-05-14 | Discharge: 2021-05-14 | Disposition: A | Discharge status: Home / Self Care | Payer: Medicaid Other | Attending: Emergency Medicine | Admitting: Emergency Medicine

## 2021-05-14 ENCOUNTER — Encounter (HOSPITAL_COMMUNITY): Payer: Self-pay | Admitting: Emergency Medicine

## 2021-05-14 ENCOUNTER — Other Ambulatory Visit: Payer: Self-pay

## 2021-05-14 DIAGNOSIS — X58XXXA Exposure to other specified factors, initial encounter: Secondary | ICD-10-CM | POA: Diagnosis not present

## 2021-05-14 DIAGNOSIS — S46912A Strain of unspecified muscle, fascia and tendon at shoulder and upper arm level, left arm, initial encounter: Secondary | ICD-10-CM

## 2021-05-14 DIAGNOSIS — Z5321 Procedure and treatment not carried out due to patient leaving prior to being seen by health care provider: Secondary | ICD-10-CM | POA: Diagnosis not present

## 2021-05-14 DIAGNOSIS — Y9361 Activity, american tackle football: Secondary | ICD-10-CM | POA: Diagnosis not present

## 2021-05-14 DIAGNOSIS — F17210 Nicotine dependence, cigarettes, uncomplicated: Secondary | ICD-10-CM | POA: Diagnosis not present

## 2021-05-14 DIAGNOSIS — S4992XA Unspecified injury of left shoulder and upper arm, initial encounter: Secondary | ICD-10-CM | POA: Diagnosis present

## 2021-05-14 NOTE — ED Triage Notes (Signed)
Pt c/o LT shoulder pain x 2 days. Stated he injured it playing football. No obvious deformity.

## 2021-05-14 NOTE — ED Provider Notes (Signed)
[facility] EMERGENCY DEPARTMENT Provider Note   CSN: [id number] Arrival date & time: 05/14/21  1522     History Chief Complaint  Patient presents with   Shoulder Pain    Jeffrey Coleman is a 40 y.o. male.  40 year old male with complaint of left shoulder pain after tackling someone playing football 2 days ago. Onset as a neck pain but neck pain has resolved and now has throbbing left shoulder pain with tingling on his left thumb and palm. ROM limited secondary to pain. No other injuries.       Past Medical History:  Diagnosis Date   Anxiety    Depression    Schizo affective schizophrenia [facility])     Patient Active Problem List   Diagnosis Date Noted   Schizophrenia ([place]) 04/20/2019   Cannabis use disorder, moderate, dependence ([place]) 03/14/2016   Schizophrenia, paranoid ([place]) 05/31/2015   Tobacco use disorder 08/22/2014    History reviewed. No pertinent surgical history.     Family History  Problem Relation Age of Onset   Cancer Father     Social History   Tobacco Use   Smoking status: Every Day    Packs/day: 1.00    Years: 15.00    Pack years: 15.00    Types: Cigarettes   Smokeless tobacco: Never  Vaping Use   Vaping Use: Never used  Substance Use Topics   Alcohol use: Yes    Comment: occasional   Drug use: Yes    Types: Marijuana, Cocaine    Comment: hx of crack; THC use yesterday    Home Medications Prior to Admission medications   Medication Sig Start Date End Date Taking? Authorizing Provider  ARIPiprazole ER (ABILIFY MAINTENA) 400 MG SUSR Inject 400 mg into the muscle every 30 (thirty) days. 03/15/16  Yes Hildred Priest, MD  ARISTADA 882 MG/3.2ML prefilled syringe Inject 882 mg into the muscle every 30 (thirty) days. 04/17/21  Yes [provider]  benztropine (COGENTIN) 1 MG tablet Take 1 mg by mouth 2 (two) times daily. 05/01/21  Yes [provider]  haloperidol (HALDOL) 5 MG tablet Take 5 mg by mouth at bedtime.  04/11/21  Yes [provider]  lithium carbonate (ESKALITH) 450 MG CR tablet Take 450 mg by mouth 2 (two) times daily. 05/11/21  Yes [provider]  ibuprofen (ADVIL) 600 MG tablet Take 1 tablet (600 mg total) by mouth every 8 (eight) hours as needed for moderate pain. Patient not taking: No sig reported 04/15/21   Evalee Jefferson, PA-C    Allergies    Patient has no known allergies.  Review of Systems   Review of Systems  Constitutional:  Negative for fever.  Musculoskeletal:  Positive for arthralgias and myalgias. Negative for neck pain and neck stiffness.  Skin:  Negative for color change, rash and wound.  Allergic/Immunologic: Negative for immunocompromised state.  Neurological:  Negative for weakness and numbness.  Psychiatric/Behavioral:  Negative for confusion.    Physical Exam Updated Vital Signs BP 123/87 (BP Location: Right Arm)   Pulse 67   Temp 98.1 F (36.7 C) (Oral)   Resp 19   Ht '5\' 4"'$  (1.626 m)   Wt 68 kg   SpO2 99%   BMI 25.75 kg/m   Physical Exam Vitals and nursing note reviewed.  Constitutional:      General: He is not in acute distress.    Appearance: He is well-developed. He is not diaphoretic.  HENT:     Head: Normocephalic  and atraumatic.  Cardiovascular:     Pulses: Normal pulses.  Pulmonary:     Effort: Pulmonary effort is normal.  Musculoskeletal:        General: No swelling, tenderness, deformity or signs of injury. Normal range of motion.     Left shoulder: No swelling, deformity, effusion, tenderness, bony tenderness or crepitus. Normal range of motion. Normal pulse.     Cervical back: No tenderness or bony tenderness.     Thoracic back: No spasms, tenderness or bony tenderness.  Skin:    General: Skin is warm and dry.     Findings: No erythema or rash.  Neurological:     Mental Status: He is alert and oriented to person, place, and time.     Sensory: No sensory deficit.     Motor: No weakness.  Psychiatric:         Behavior: Behavior normal.    ED Results / Procedures / Treatments   Labs (all labs ordered are listed, but only abnormal results are displayed) Labs Reviewed - No data to display  EKG None  Radiology DG Shoulder Left  Result Date: 05/14/2021 CLINICAL DATA:  Left shoulder pain following a football injury 2 days ago. EXAM: LEFT SHOULDER - 2+ VIEW COMPARISON:  None. FINDINGS: There is no evidence of fracture or dislocation. There is no evidence of arthropathy or other focal bone abnormality. Soft tissues are unremarkable. IMPRESSION: Negative. Electronically Signed   By: Claudie Revering M.D.   On: 05/14/2021 16:07    Procedures Procedures   Medications Ordered in ED Medications - No data to display  ED Course  I have reviewed the triage vital signs and the nursing notes.  Pertinent labs & imaging results that were available during my care of the patient were reviewed by me and considered in my medical decision making (see chart for details).  Clinical Course as of 05/14/21 Pricilla Loveless [birth year], [birth year]  2336 40 year old male with left shoulder pain as above.  On exam with student, patient with limited range of motion of left shoulder with generalized tenderness palpation.  Pulses sensation intact.  Upon my evaluation, patient is resting in the chair with his hands over his head with full range of motion of the left shoulder, no specific point tenderness.  Patient request a sling to rest his arm.  Discussed limited use of sling and importance of removing her from the sling for range of motion exercises to prevent adhesive capsulitis.  Patient will with PCP if pain persist.  Recommend short course of anti-inflammatory. [LM]    Clinical Course User Index [LM] [name]   MDM Rules/Calculators/A&P                           Final Clinical Impression(s) / ED Diagnoses Final diagnoses:  Strain of left shoulder, initial encounter    Rx / DC Orders ED Discharge Orders     None         Tacy Learn, PA-C 05/14/21 Marijean Bravo, MD 05/15/21 [phone]

## 2021-05-14 NOTE — ED Notes (Signed)
Went into patients room with discharge paperwork and sling for arm.  Patient not in room.  Went out to the front and asked if the patient left.  Staff said the patient did walk out the door, unknown if he got into a car or not but the patient is no where to be seen in the parking lot.

## 2021-05-14 NOTE — Discharge Instructions (Addendum)
Take naproxen twice daily for pain. Remove your arm from the sling regularly throughout the day as discussed for gentle range of motion exercises to avoid a frozen shoulder.  Follow-up with your primary care provider for recheck and referral if not improving.

## 2021-05-17 ENCOUNTER — Emergency Department (HOSPITAL_COMMUNITY)
Admission: EM | Admit: 2021-05-17 | Discharge: 2021-05-18 | Disposition: A | Discharge status: Other Acute Inpatient Hospital | Payer: No Typology Code available for payment source | Attending: Emergency Medicine | Admitting: Emergency Medicine

## 2021-05-17 ENCOUNTER — Encounter (HOSPITAL_COMMUNITY): Payer: Self-pay | Admitting: Emergency Medicine

## 2021-05-17 ENCOUNTER — Other Ambulatory Visit: Payer: Self-pay

## 2021-05-17 DIAGNOSIS — R45851 Suicidal ideations: Secondary | ICD-10-CM

## 2021-05-17 DIAGNOSIS — Y900 Blood alcohol level of less than 20 mg/100 ml: Secondary | ICD-10-CM | POA: Insufficient documentation

## 2021-05-17 DIAGNOSIS — Z79899 Other long term (current) drug therapy: Secondary | ICD-10-CM | POA: Insufficient documentation

## 2021-05-17 DIAGNOSIS — F17210 Nicotine dependence, cigarettes, uncomplicated: Secondary | ICD-10-CM | POA: Diagnosis not present

## 2021-05-17 DIAGNOSIS — R4182 Altered mental status, unspecified: Secondary | ICD-10-CM | POA: Diagnosis present

## 2021-05-17 DIAGNOSIS — Z20822 Contact with and (suspected) exposure to covid-19: Secondary | ICD-10-CM | POA: Insufficient documentation

## 2021-05-17 DIAGNOSIS — F209 Schizophrenia, unspecified: Secondary | ICD-10-CM | POA: Insufficient documentation

## 2021-05-17 LAB — CBC WITH DIFFERENTIAL/PLATELET
Abs Immature Granulocytes: 0.04 10*3/uL (ref 0.00–0.07)
Basophils Absolute: 0 10*3/uL (ref 0.0–0.1)
Basophils Relative: 0 %
Eosinophils Absolute: 0 10*3/uL (ref 0.0–0.5)
Eosinophils Relative: 0 %
HCT: 46.1 % (ref 39.0–52.0)
Hemoglobin: 16.2 g/dL (ref 13.0–17.0)
Immature Granulocytes: 0 %
Lymphocytes Relative: 12 %
Lymphs Abs: 1.2 10*3/uL (ref 0.7–4.0)
MCH: 30.2 pg (ref 26.0–34.0)
MCHC: 35.1 g/dL (ref 30.0–36.0)
MCV: 86 fL (ref 80.0–100.0)
Monocytes Absolute: 0.2 10*3/uL (ref 0.1–1.0)
Monocytes Relative: 2 %
Neutro Abs: 8.6 10*3/uL — ABNORMAL HIGH (ref 1.7–7.7)
Neutrophils Relative %: 86 %
Platelets: 284 10*3/uL (ref 150–400)
RBC: 5.36 MIL/uL (ref 4.22–5.81)
RDW: 14.3 % (ref 11.5–15.5)
WBC: 10 10*3/uL (ref 4.0–10.5)
nRBC: 0 % (ref 0.0–0.2)

## 2021-05-17 LAB — COMPREHENSIVE METABOLIC PANEL
ALT: 17 U/L (ref 0–44)
AST: 22 U/L (ref 15–41)
Albumin: 4.3 g/dL (ref 3.5–5.0)
Alkaline Phosphatase: 50 U/L (ref 38–126)
Anion gap: 7 (ref 5–15)
BUN: 13 mg/dL (ref 6–20)
CO2: 24 mmol/L (ref 22–32)
Calcium: 9.5 mg/dL (ref 8.9–10.3)
Chloride: 106 mmol/L (ref 98–111)
Creatinine, Ser: 0.97 mg/dL (ref 0.61–1.24)
GFR, Estimated: >60 mL/min (ref >60)
Glucose, Bld: 114 mg/dL — ABNORMAL HIGH (ref 70–99)
Potassium: 4 mmol/L (ref 3.5–5.1)
Sodium: 137 mmol/L (ref 135–145)
Total Bilirubin: 0.4 mg/dL (ref 0.3–1.2)
Total Protein: 7.7 g/dL (ref 6.5–8.1)

## 2021-05-17 LAB — ETHANOL: Alcohol, Ethyl (B): <10 mg/dL (ref <10)

## 2021-05-17 LAB — RESP PANEL BY RT-PCR (FLU A&B, COVID) ARPGX2
Influenza A by PCR: NEGATIVE
Influenza B by PCR: NEGATIVE
SARS Coronavirus 2 by RT PCR: NEGATIVE

## 2021-05-17 LAB — LITHIUM LEVEL: Lithium Lvl: 0.54 mmol/L — ABNORMAL LOW (ref 0.60–1.20)

## 2021-05-17 MED ORDER — BENZTROPINE MESYLATE 1 MG PO TABS
1.0000 mg | ORAL_TABLET | Freq: Two times a day (BID) | ORAL | Status: DC
  Administered 2021-05-17 – 2021-05-18 (×3): 1 mg via ORAL
  Filled 2021-05-17 (×3): qty 1

## 2021-05-17 MED ORDER — HALOPERIDOL 5 MG PO TABS
5.0000 mg | ORAL_TABLET | Freq: Every day | ORAL | Status: DC
  Administered 2021-05-17: 5 mg via ORAL
  Filled 2021-05-17: qty 1

## 2021-05-17 MED ORDER — LITHIUM CARBONATE ER 450 MG PO TBCR
450.0000 mg | EXTENDED_RELEASE_TABLET | Freq: Two times a day (BID) | ORAL | Status: DC
  Administered 2021-05-17 – 2021-05-18 (×3): 450 mg via ORAL
  Filled 2021-05-17 (×3): qty 1

## 2021-05-17 MED ORDER — ACETAMINOPHEN 325 MG PO TABS
650.0000 mg | ORAL_TABLET | Freq: Four times a day (QID) | ORAL | Status: DC | PRN
  Administered 2021-05-17 – 2021-05-18 (×3): 650 mg via ORAL
  Filled 2021-05-17 (×3): qty 2

## 2021-05-17 NOTE — ED Notes (Signed)
Per [facility] pts meets in patient criteria. MD made aware

## 2021-05-17 NOTE — ED Notes (Signed)
Per ems pt was masturbating in ambulance.

## 2021-05-17 NOTE — ED Triage Notes (Signed)
Pt c/o being SI for a couple of days. His plan is suicide by cop per patient.

## 2021-05-17 NOTE — ED Notes (Signed)
Per  Shelton Silvas with social work;  Pt has been accepted to [facility] [phone]. CSW spoke with Helene Kelp who provided accepting information.  Pt can admit tomorrow 05/18/21. Accepting Physician is Dr. Cecelia Byars. Pt will admit to [facility] unit and the phone number for report is [phone]

## 2021-05-17 NOTE — ED Notes (Signed)
Updated vitals given to [facility] with admissions @ [facility]. Pt is being considered for placement for tomorrow.

## 2021-05-17 NOTE — BH Assessment (Signed)
Comprehensive Clinical Assessment (CCA) Note  05/17/2021 Nate Berrocal [id number]  Disposition: Consulted with Patsy Baltimore, NP, who determined that Pt meets inpatient criteria.  Pt meets criteria for IVC should he decide to leave.  The patient demonstrates the following risk factors for suicide: Chronic risk factors for suicide include: psychiatric disorder of Schizophrenia and substance use disorder. Acute risk factors for suicide include: social withdrawal/isolation and mother's declining health, risk of  . Protective factors for this patient include: positive therapeutic relationship. Considering these factors, the overall suicide risk at this point appears to be moderate. Patient is not appropriate for outpatient follow up.   [facility] ED from 05/17/2021 in [facility] ED from 05/14/2021 in [facility] ED from 04/15/2021 in [facility] CATEGORY Moderate Risk No Risk No Risk       Pt's suicide risk is moderate.  For moderate risk, a 2:1 sitter protocol is recommended.  Chief Complaint:  Chief Complaint  Patient presents with   V70.1   Suicidal    Pt endorsed recent suicidal ideation and despondency due to stressors at home.  Pt endorsed plan of suicidal by cop.   Visit Diagnosis: Schizophrenia   Narrative:  Pt is a 40 year old male who presented to [facility] on a voluntary basis with complaint of suicidal ideation with plan to induce suicide by cop.  Pt lives in [place] with his mother, and per his report, he receives disability due to Schizophrenia.  Pt is followed by [name], and he receives monthly injections for treatment of Schizophrenia.  Pt stated that over the last two days, he has felt suicidal because of two issues -- first, his mother's health has been declining recently, and second, his mother said that the family may lose the home because of her health concerns.  Pt described a vague plan to die at the  hands of a Engineer, structural.  He reported one previous suicide attempt by hanging over a decade ago.    In addition to recent suicidal ideation, Pt endorsed the following symptoms which have been present for at least a month:  Despondency; isolation; auditory hallucination (which is Pt's baseline); and anxiety/worry.  Pt also endorsed daily use of marijuana -- about a joint a day.  Pt also endorsed a history of cutting and   When asked if Pt felt suicidal today, he replied, ''I really don't know.'' When asked what help he wanted, Pt said, ''I want help with depression.  I don't want to feel suicidal.''  During assessment, Pt presented as alert and oriented.  He had good eye contact and was cooperative.  Pt was appropriately groomed.  Pt's mood was depressed.  Affect was blunted.  Speech was normal in rate, rhythm, and volume.  Thought processes were within normal range, and thought content was logical and goal-oriented.  There was no evidence of delusion.  Memory and concentration were intact.  Insight, judgment, and impulse control were fair.  Spoke with Pt's ACT Team [employer] ACTT --[phone]).  Team member confirmed that Pt receives outpatient services.  When asked about Pt's safety, team member replied, ''It's a concern.''  Attempted to contact Pt's mother for collateral.  Pt's mother did not answer, and it was not possible to leave voicemail.  CCA Screening, Triage and Referral (STR)  Patient Reported Information How did you hear about [place]? No data recorded What Is the Reason for Your Visit/Call Today? Pt endorsed recent suicidal ideation  How Long Has This Been Causing You Problems? <Week  What Do You Feel Would Help You the Most Today? Treatment for Depression or other mood problem; Stress Management   Have You Recently Had Any Thoughts About Hurting Yourself? Yes  Are You Planning to Commit Suicide/Harm Yourself At This time? Yes (''I'm really not sure.'')   Have you Recently  Had Thoughts About [place]? No  Are You Planning to Harm Someone at This Time? No  Explanation: No data recorded  Have You Used Any Alcohol or Drugs in the Past 24 Hours? No  How Long Ago Did You Use Drugs or Alcohol? No data recorded What Did You Use and How Much? No data recorded  Do You Currently Have a Therapist/Psychiatrist? Yes  Name of Therapist/Psychiatrist: [place] Recently Discharged From Any [facility] or Programs? No  Explanation of Discharge From Practice/Program: No data recorded    CCA Screening Triage Referral Assessment Type of Contact: Tele-Assessment  Telemedicine Service Delivery: Telemedicine service delivery: This service was provided via telemedicine using a 2-way, interactive audio and video technology  Is this Initial or Reassessment? Initial Assessment  Date Telepsych consult ordered in [facility]:  05/17/21  Time Telepsych consult ordered in [facility]:  No data recorded Location of Assessment: [facility]  Provider Location: [facility]   Collateral Involvement: No data recorded  Does Patient Have a [place]? No data recorded Name and Contact of Legal Guardian: No data recorded If Minor and Not Living with Parent(s), Who has Custody? No data recorded Is CPS involved or ever been involved? Never  Is APS involved or ever been involved? Never   Patient Determined To Be At Risk for Harm To Self or Others Based on Review of Patient Reported Information or Presenting Complaint? No data recorded Method: No data recorded Availability of Means: No data recorded Intent: No data recorded Notification Required: No data recorded Additional Information for Danger to Others Potential: No data recorded Additional Comments for Danger to Others Potential: No data recorded Are There Guns or Other Weapons in Your Home? No data recorded Types of Guns/Weapons: No data recorded Are These Weapons Safely  Secured?                            No data recorded Who Could Verify You Are Able To Have These Secured: No data recorded Do You Have any Outstanding Charges, Pending Court Dates, Parole/Probation? No data recorded Contacted To Inform of Risk of Harm To Self or Others: No data recorded   Does Patient Present under Involuntary Commitment? No  IVC Papers Initial File Date: No data recorded  [place] of Residence: [place]   Patient Currently Receiving the Following Services: ACTT Architect); Medication Management   Determination of Need: Urgent (48 hours)   Options For Referral: Inpatient Hospitalization; Medication Management; Mobile Crisis     CCA Biopsychosocial Patient Reported Schizophrenia/Schizoaffective Diagnosis in Past: Yes   Strengths: Pt has insight, receives support services   Mental Health Symptoms Depression:   Change in energy/activity; Hopelessness; Difficulty Concentrating   Duration of Depressive symptoms:  Duration of Depressive Symptoms: Less than two weeks   Mania:   None   Anxiety:    Worrying   Psychosis:   Hallucinations   Duration of Psychotic symptoms:  Duration of Psychotic Symptoms: Greater than six months   Trauma:   None   Obsessions:  None   Compulsions:   None   Inattention:   None   Hyperactivity/Impulsivity:   None   Oppositional/Defiant Behaviors:   None   Emotional Irregularity:   Mood lability   Other Mood/Personality Symptoms:  No data recorded   Mental Status Exam Appearance and self-care  Stature:   Average   Weight:   Average weight   Clothing:   Casual   Grooming:   Normal   Cosmetic use:   None   Posture/gait:   Normal   Motor activity:   Not Remarkable   Sensorium  Attention:   Normal   Concentration:   Normal   Orientation:   X5   Recall/memory:   Normal   Affect and Mood  Affect:   Appropriate   Mood:   Depressed   Relating  Eye  contact:   Normal   Facial expression:   Responsive   Attitude toward examiner:   Cooperative   Thought and Language  Speech flow:  Normal   Thought content:   Appropriate to Mood and Circumstances   Preoccupation:   None   Hallucinations:   Auditory   Organization:  No data recorded  [employer] of Knowledge:   Average   Intelligence:   Average   Abstraction:   Abstract   Judgement:   Poor   Reality Testing:   Adequate   Insight:   Fair   Decision Making:   Impulsive   Social Functioning  Social Maturity:   Isolates   Social Judgement:   Victimized   Stress  Stressors:   Housing   Coping Ability:   Programme researcher, broadcasting/film/video Deficits:   Activities of daily living   Supports:   Usual     Religion:    Leisure/Recreation:    Exercise/Diet: Exercise/Diet Have You Gained or Lost A Significant Amount of Weight in the Past Six Months?: No Do You Follow a Special Diet?: No Do You Have Any Trouble Sleeping?: No   CCA Employment/Education Employment/Work Situation: Employment / Work Technical sales engineer: On disability Why is Patient on Disability: Schizophrenia Has Patient ever Been in the [employer]?: No  Education: Education Is Patient Currently Attending School?: No   CCA Family/Childhood History Family and Relationship History: Family history Marital status: Single Does patient have children?: Yes How many children?: 1  Childhood History:  Childhood History By whom was/is the patient raised?: Mother Did patient suffer any verbal/emotional/physical/sexual abuse as a child?: No Did patient suffer from severe childhood neglect?: No Has patient ever been sexually abused/assaulted/raped as an adolescent or adult?: No Was the patient ever a victim of a crime or a disaster?: No Witnessed domestic violence?: No Has patient been affected by domestic violence as an adult?: No  Child/Adolescent Assessment:      CCA Substance Use Alcohol/Drug Use: Alcohol / Drug Use Pain Medications: Please see MAR Prescriptions: Please see MAR Over the Counter: Please see MAR History of alcohol / drug use?: Yes Substance #1 Name of Substance 1: Marijuana 1 - Amount (size/oz): one joint 1 - Frequency: Daily 1 - Duration: Ongoing 1 - Last Use / Amount: Approx 05/12/2021 -- one joint 1 - Method of Aquiring: Street purchase 1- Route of Use: oral inhalation                       ASAM's:  Six Dimensions of Multidimensional Assessment  Dimension 1:  Acute Intoxication and/or Withdrawal Potential:   Dimension 1:  Description of individual's past and current experiences of substance use and withdrawal: Current use  Dimension 2:  Biomedical Conditions and Complications:   Dimension 2:  Description of patient's biomedical conditions and  complications: None indicated  Dimension 3:  Emotional, Behavioral, or Cognitive Conditions and Complications:  Dimension 3:  Description of emotional, behavioral, or cognitive conditions and complications: Pt stated that he has a dx of schizophrenia  Dimension 4:  Readiness to Change:  Dimension 4:  Description of Readiness to Change criteria: Pt did not indicate a desire to stop using  Dimension 5:  Relapse, Continued use, or Continued Problem Potential:  Dimension 5:  Relapse, continued use, or continued problem potential critiera description: Pt endorsed ongoing use  Dimension 6:  Recovery/Living Environment:  Dimension 6:  Recovery/Iiving environment criteria description: Supportive milieu  ASAM Severity Score: ASAM's Severity Rating Score: 11  ASAM Recommended Level of Treatment: ASAM Recommended Level of Treatment: Level I Outpatient Treatment   Substance use Disorder (SUD) Substance Use Disorder (SUD)  Checklist Symptoms of Substance Use: Continued use despite having a persistent/recurrent physical/psychological problem caused/exacerbated by  use  Recommendations for Services/Supports/Treatments: Recommendations for Services/Supports/Treatments Recommendations For Services/Supports/Treatments: ACCTT (Assertive Community Treatment)  Discharge Disposition:    DSM5 Diagnoses: Patient Active Problem List   Diagnosis Date Noted   Schizophrenia ([place]) 04/20/2019   Cannabis use disorder, moderate, dependence ([place]) 03/14/2016   Schizophrenia, paranoid ([place]) 05/31/2015   Tobacco use disorder 08/22/2014     Referrals to Alternative Service(s): Referred to Alternative Service(s):   Place:   Date:   Time:    Referred to Alternative Service(s):   Place:   Date:   Time:    Referred to Alternative Service(s):   Place:   Date:   Time:    Referred to Alternative Service(s):   Place:   Date:   Time:     Marlowe Aschoff, [facility]

## 2021-05-17 NOTE — Progress Notes (Signed)
Patient has been faxed out due to no beds available at [facility]. Patient meets inpatient criteria per Beatriz Stallion ,NP. Patient referred to the following facilities:  [facility]  [address], [place] [address] [phone] [place]  [address], [place] [address] [phone]  [facility]  [address] [place] [address] [phone] [address] [place] [address] [phone]  [facility]  [place], [place] [address] [phone] [place], [place] [address] [facility]  [id number] [place] [address] [phone] [facility]  [address] [place] [address] [place]  [facility]  [address], [place] [address] [phone]  [facility]  [address], [place] [address] [phone]    CSW will continue to monitor disposition.    Mariea Clonts, MSW, LCSW-A  2:26 PM 05/17/2021

## 2021-05-17 NOTE — Progress Notes (Addendum)
Pt was accepted to [facility] on 05/18/21 after 1200 (noon).  CSW spoke with Helene Kelp with [facility] who provided accepting information.  Pt meets inpatient criteria per Beatriz Stallion, NP  Attending Physician will be Dr. Cecelia Byars.   Report can be called to: [phone]  Pt can arrive after 1200(noon).  CSW communicated with nursing staff Neysa Hotter, RN and EDP Thamas Jaegers, MD. CSW requested that nursing staff coordinate transportation for pt and Lynnell Grain, RN verbalized agreement via secure chat.    Nadara Mode, [place] 05/17/2021 @ 5:52 PM
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
## 2021-05-18 LAB — RAPID URINE DRUG SCREEN, HOSP PERFORMED
Amphetamines: NOT DETECTED
Barbiturates: NOT DETECTED
Benzodiazepines: NOT DETECTED
Cocaine: POSITIVE — AB
Opiates: NOT DETECTED
Tetrahydrocannabinol: POSITIVE — AB

## 2021-05-18 NOTE — ED Provider Notes (Signed)
Emergency Medicine Observation Re-evaluation Note  Jeffrey Coleman is a 40 y.o. male, seen on rounds today.  Pt initially presented to the ED for complaints of V70.1 and Suicidal (Pt endorsed recent suicidal ideation and despondency due to stressors at home.  Pt endorsed plan of suicidal by cop.) Currently, the patient is resting.  Physical Exam  BP 129/90 (BP Location: Left Arm)   Pulse 66   Temp 98.5 F (36.9 C)   Resp 16   Ht '5\' 4"'$  (1.626 m)   Wt 68 kg   SpO2 100%   BMI 25.73 kg/m  Physical Exam Resting comfortably. Calm.  ED Course / MDM  EKG:   I have reviewed the labs performed to date as well as medications administered while in observation.  Recent changes in the last 24 hours include none.  Plan  Current plan is for placement.  Pt has been accepted to [facility] by Dr. Cecelia Coleman.  He remains stable for transfer.  Jeffrey Coleman is not under involuntary commitment.     Jeffrey Pence, MD 05/18/21 1013

## 2021-07-18 ENCOUNTER — Emergency Department
Admission: EM | Admit: 2021-07-18 | Discharge: 2021-07-19 | Disposition: A | Discharge status: Home / Self Care | Payer: No Typology Code available for payment source | Attending: Emergency Medicine | Admitting: Emergency Medicine

## 2021-07-18 ENCOUNTER — Encounter: Payer: Self-pay | Admitting: Emergency Medicine

## 2021-07-18 ENCOUNTER — Other Ambulatory Visit: Payer: Self-pay

## 2021-07-18 DIAGNOSIS — Y900 Blood alcohol level of less than 20 mg/100 ml: Secondary | ICD-10-CM | POA: Diagnosis not present

## 2021-07-18 DIAGNOSIS — F1220 Cannabis dependence, uncomplicated: Secondary | ICD-10-CM | POA: Diagnosis present

## 2021-07-18 DIAGNOSIS — F17210 Nicotine dependence, cigarettes, uncomplicated: Secondary | ICD-10-CM | POA: Insufficient documentation

## 2021-07-18 DIAGNOSIS — Z79899 Other long term (current) drug therapy: Secondary | ICD-10-CM | POA: Diagnosis not present

## 2021-07-18 DIAGNOSIS — Z20822 Contact with and (suspected) exposure to covid-19: Secondary | ICD-10-CM | POA: Diagnosis not present

## 2021-07-18 DIAGNOSIS — F250 Schizoaffective disorder, bipolar type: Secondary | ICD-10-CM | POA: Diagnosis not present

## 2021-07-18 DIAGNOSIS — F200 Paranoid schizophrenia: Secondary | ICD-10-CM

## 2021-07-18 DIAGNOSIS — F17200 Nicotine dependence, unspecified, uncomplicated: Secondary | ICD-10-CM | POA: Diagnosis present

## 2021-07-18 DIAGNOSIS — F209 Schizophrenia, unspecified: Secondary | ICD-10-CM | POA: Diagnosis present

## 2021-07-18 LAB — CBC WITH DIFFERENTIAL/PLATELET
Abs Immature Granulocytes: 0.02 10*3/uL (ref 0.00–0.07)
Basophils Absolute: 0.1 10*3/uL (ref 0.0–0.1)
Basophils Relative: 1 %
Eosinophils Absolute: 0.2 10*3/uL (ref 0.0–0.5)
Eosinophils Relative: 3 %
HCT: 44 % (ref 39.0–52.0)
Hemoglobin: 15.7 g/dL (ref 13.0–17.0)
Immature Granulocytes: 0 %
Lymphocytes Relative: 45 %
Lymphs Abs: 4.1 10*3/uL — ABNORMAL HIGH (ref 0.7–4.0)
MCH: 30.4 pg (ref 26.0–34.0)
MCHC: 35.7 g/dL (ref 30.0–36.0)
MCV: 85.3 fL (ref 80.0–100.0)
Monocytes Absolute: 0.8 10*3/uL (ref 0.1–1.0)
Monocytes Relative: 9 %
Neutro Abs: 3.8 10*3/uL (ref 1.7–7.7)
Neutrophils Relative %: 42 %
Platelets: 282 10*3/uL (ref 150–400)
RBC: 5.16 MIL/uL (ref 4.22–5.81)
RDW: 13.8 % (ref 11.5–15.5)
WBC: 9 10*3/uL (ref 4.0–10.5)
nRBC: 0 % (ref 0.0–0.2)

## 2021-07-18 LAB — COMPREHENSIVE METABOLIC PANEL
ALT: 18 U/L (ref 0–44)
AST: 20 U/L (ref 15–41)
Albumin: 4 g/dL (ref 3.5–5.0)
Alkaline Phosphatase: 51 U/L (ref 38–126)
Anion gap: 6 (ref 5–15)
BUN: 6 mg/dL (ref 6–20)
CO2: 26 mmol/L (ref 22–32)
Calcium: 8.7 mg/dL — ABNORMAL LOW (ref 8.9–10.3)
Chloride: 106 mmol/L (ref 98–111)
Creatinine, Ser: 0.92 mg/dL (ref 0.61–1.24)
GFR, Estimated: >60 mL/min (ref >60)
Glucose, Bld: 109 mg/dL — ABNORMAL HIGH (ref 70–99)
Potassium: 3.5 mmol/L (ref 3.5–5.1)
Sodium: 138 mmol/L (ref 135–145)
Total Bilirubin: 0.8 mg/dL (ref 0.3–1.2)
Total Protein: 6.8 g/dL (ref 6.5–8.1)

## 2021-07-18 LAB — ETHANOL: Alcohol, Ethyl (B): <10 mg/dL (ref <10)

## 2021-07-18 LAB — LITHIUM LEVEL: Lithium Lvl: <0.06 mmol/L — ABNORMAL LOW (ref 0.60–1.20)

## 2021-07-18 MED ORDER — HALOPERIDOL 5 MG PO TABS
5.0000 mg | ORAL_TABLET | Freq: Every day | ORAL | Status: DC
  Administered 2021-07-18: 5 mg via ORAL
  Filled 2021-07-18: qty 1

## 2021-07-18 MED ORDER — BENZTROPINE MESYLATE 1 MG PO TABS
1.0000 mg | ORAL_TABLET | Freq: Two times a day (BID) | ORAL | Status: DC
  Administered 2021-07-18 – 2021-07-19 (×2): 1 mg via ORAL
  Filled 2021-07-18 (×2): qty 1

## 2021-07-18 MED ORDER — LITHIUM CARBONATE ER 450 MG PO TBCR
450.0000 mg | EXTENDED_RELEASE_TABLET | Freq: Two times a day (BID) | ORAL | Status: DC
  Filled 2021-07-18: qty 1
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
## 2021-07-19 DIAGNOSIS — F200 Paranoid schizophrenia: Secondary | ICD-10-CM

## 2021-07-19 LAB — URINE DRUG SCREEN, QUALITATIVE (ARMC ONLY)
Amphetamines, Ur Screen: NOT DETECTED
Barbiturates, Ur Screen: NOT DETECTED
Benzodiazepine, Ur Scrn: NOT DETECTED
Cannabinoid 50 Ng, Ur ~~LOC~~: POSITIVE — AB
Cocaine Metabolite,Ur ~~LOC~~: POSITIVE — AB
MDMA (Ecstasy)Ur Screen: NOT DETECTED
Methadone Scn, Ur: NOT DETECTED
Opiate, Ur Screen: NOT DETECTED
Phencyclidine (PCP) Ur S: NOT DETECTED
Tricyclic, Ur Screen: NOT DETECTED

## 2021-07-19 LAB — RESP PANEL BY RT-PCR (FLU A&B, COVID) ARPGX2
Influenza A by PCR: NEGATIVE
Influenza B by PCR: NEGATIVE
SARS Coronavirus 2 by RT PCR: NEGATIVE

## 2021-07-19 MED ORDER — ARIPIPRAZOLE LAUROXIL ER 882 MG/3.2ML IM PRSY: 882.0000 mg | PREFILLED_SYRINGE | INTRAMUSCULAR | Status: DC

## 2021-07-19 NOTE — BH Assessment (Signed)
Comprehensive Clinical Assessment (CCA) Note  07/19/2021 Jeffrey Coleman [id number]  Chief Complaint:  Chief Complaint  Patient presents with   Suicidal  Recommendations for Services/Supports/Treatments: Consulted with psych NP Lynder Parents., who recommended for inpatient treatment. Notified EDP and Deneise Lever, RN of disposition recommendation.  Per IVC, pt presented to the ED due to worsening delusions, paranoia, and increasingly agitated behaviors. Pt was resting quietly upon this writer's arrival. Pt's speech was clear and coherent. Thought processes were clear and coherent. Motor behavior was unremarkable. Pt identified his main stressor as family conflict, but did not expand upon specific problems. Pt had good eye contact and his memory was seemingly intact. Pt's mood was anxious; affect is congruent. Pt had poor insight and impaired judgement. Pt was calm and cooperative throughout the assessment. The pt reported that he is followed by the [place] Team and his next shot is due today 07/19/21. Pt denied having a suicidal intent or a specific plan of action. The patient denied current HI or V/H. Pt's UDS is still pending. Pt reported that he last used marijuana and cocaine 3 days ago.   Collateral: This Probation officer attempted to notify pt's Margaretha Sheffield Patterns [phone]) a mental health provider of the [facility].  There was no answer/ability to leave a voicemail.   Please be advised: Pt has orders for his injection that is due today. Ivor Costa Seals needs to be contacted in order for the medication to be brought to the pt while in the ED.   Visit Diagnosis: Tobacco use disorder   Schizophrenia, paranoid ([place])   Cannabis use disorder, moderate, dependence (HCC)   Schizophrenia ([place])      CCA Screening, Triage and Referral (STR)  Patient Reported Information How did you hear about [place]? Other (Comment) [facility] Costa Seals ACT TEAM)  Referral name: No data recorded Referral phone number: No data  recorded  Whom do you see for routine medical problems? No data recorded Practice/Facility Name: No data recorded Practice/Facility Phone Number: No data recorded Name of Contact: No data recorded Contact Number: No data recorded Contact Fax Number: No data recorded Prescriber Name: No data recorded Prescriber Address (if known): No data recorded  What Is the Reason for Your Visit/Call Today? Pt presents due to having active hallucinations and delusions , with increased agitation and irritability per IVC report.  How Long Has This Been Causing You Problems? <Week  What Do You Feel Would Help You the Most Today? Treatment for Depression or other mood problem; Medication(s)   Have You Recently Been in Any Inpatient Treatment (Hospital/Detox/Crisis Center/28-Day Program)? No data recorded Name/Location of Program/Hospital:No data recorded How Long Were You There? No data recorded When Were You Discharged? No data recorded  Have You Ever Received Services From [facility] Before? No data recorded Who Do You See at [facility]? No data recorded  Have You Recently Had Any Thoughts About Hurting Yourself? No  Are You Planning to Commit Suicide/Harm Yourself At This time? No   Have you Recently Had Thoughts About [place]? No  Explanation: No data recorded  Have You Used Any Alcohol or Drugs in the Past 24 Hours? No  How Long Ago Did You Use Drugs or Alcohol? No data recorded What Did You Use and How Much? No data recorded  Do You Currently Have a Therapist/Psychiatrist? Yes  Name of Therapist/Psychiatrist: [place] Recently Discharged From Any Mud[place] or Programs? No  Explanation of Discharge From Practice/Program: No  data recorded    CCA Screening Triage Referral Assessment Type of Contact: Face-to-Face  Is this Initial or Reassessment? Initial Assessment  Date Telepsych consult ordered in [facility]:  05/17/21  Time Telepsych  consult ordered in [facility]:  No data recorded  Patient Reported Information Reviewed? No data recorded Patient Left Without Being Seen? No data recorded Reason for Not Completing Assessment: No data recorded  Collateral Involvement: [place] provider   Does Patient Have a Court Appointed Legal Guardian? No data recorded Name and Contact of Legal Guardian: No data recorded If Minor and Not Living with Parent(s), Who has Custody? No data recorded Is CPS involved or ever been involved? Never  Is APS involved or ever been involved? Never   Patient Determined To Be At Risk for Harm To Self or Others Based on Review of Patient Reported Information or Presenting Complaint? Yes, for Self-Harm  Method: No data recorded Availability of Means: No data recorded Intent: No data recorded Notification Required: No data recorded Additional Information for Danger to Others Potential: No data recorded Additional Comments for Danger to Others Potential: No data recorded Are There Guns or Other Weapons in Your Home? No data recorded Types of Guns/Weapons: No data recorded Are These Weapons Safely Secured?                            No data recorded Who Could Verify You Are Able To Have These Secured: No data recorded Do You Have any Outstanding Charges, Pending Court Dates, Parole/Probation? No data recorded Contacted To Inform of Risk of Harm To Self or Others: No data recorded  [facility] of Assessment: [facility] ED   Does Patient Present under Involuntary Commitment? Yes  IVC Papers Initial File Date: 07/19/21   [place] of Residence: Other (Comment)   Patient Currently Receiving the Following Services: ACTT Architect); Medication Management   Determination of Need: Emergent (2 hours)   Options For Referral: Inpatient Hospitalization     CCA Biopsychosocial Intake/Chief Complaint:  No data recorded Current Symptoms/Problems: No data  recorded  Patient Reported Schizophrenia/Schizoaffective Diagnosis in Past: Yes   Strengths: Pt has insight, receives support services  Preferences: No data recorded Abilities: No data recorded  Type of Services Patient Feels are Needed: No data recorded  Initial Clinical Notes/Concerns: No data recorded  Mental Health Symptoms Depression:   Change in energy/activity; Hopelessness; Difficulty Concentrating   Duration of Depressive symptoms:  Less than two weeks   Mania:   None   Anxiety:    Worrying; Tension   Psychosis:   Hallucinations; Delusions   Duration of Psychotic symptoms:  Greater than six months   Trauma:   N/A   Obsessions:   Cause anxiety; Poor insight; Recurrent & persistent thoughts/impulses/images   Compulsions:   None   Inattention:   None   Hyperactivity/Impulsivity:   None   Oppositional/Defiant Behaviors:   None   Emotional Irregularity:   Mood lability   Other Mood/Personality Symptoms:  No data recorded   Mental Status Exam Appearance and self-care  Stature:   Average   Weight:   Average weight   Clothing:   Casual   Grooming:   Normal   Cosmetic use:   None   Posture/gait:   Normal   Motor activity:   Not Remarkable   Sensorium  Attention:   Normal   Concentration:   Normal   Orientation:   X5  Recall/memory:   Normal   Affect and Mood  Affect:   Appropriate   Mood:   Anxious   Relating  Eye contact:   Normal   Facial expression:   Responsive   Attitude toward examiner:   Cooperative   Thought and Language  Speech flow:  Normal   Thought content:   Appropriate to Mood and Circumstances   Preoccupation:   None   Hallucinations:   Auditory   Organization:  No data recorded  [employer] of Knowledge:   Average   Intelligence:   Average   Abstraction:   Abstract   Judgement:   Impaired   Reality Testing:   Distorted   Insight:   Lacking    Decision Making:   Vacilates   Social Functioning  Social Maturity:   Isolates   Social Judgement:   Impropriety   Stress  Stressors:   Family conflict   Coping Ability:   Programme researcher, broadcasting/film/video Deficits:   Activities of daily living   Supports:   Family; Friends/Service system     Religion: Religion/Spirituality Are You A Religious Person?: No  Leisure/Recreation: Leisure / Recreation Do You Have Hobbies?: No  Exercise/Diet: Exercise/Diet Do You Exercise?: No Have You Gained or Lost A Significant Amount of Weight in the Past Six Months?: No Do You Follow a Special Diet?: No Do You Have Any Trouble Sleeping?: No   CCA Employment/Education Employment/Work Situation: Employment / Work Situation Employment Situation: On disability Why is Patient on Disability: Schizophrenia How Long has Patient Been on Disability: since 2012 Patient's Job has Been Impacted by Current Illness: No Has Patient ever Been in the [employer]?: No  Education: Education Is Patient Currently Attending School?: No Did Physicist, medical?: No Did You Have An Individualized Education Program (IIEP): No Did You Have Any Difficulty At School?: No Patient's Education Has Been Impacted by Current Illness: No   CCA Family/Childhood History Family and Relationship History: Family history Marital status: Single Does patient have children?: Yes How many children?: 1 How is patient's relationship with their children?: I see on weekends  Childhood History:  Childhood History By whom was/is the patient raised?: Mother Did patient suffer any verbal/emotional/physical/sexual abuse as a child?: No Did patient suffer from severe childhood neglect?: No Has patient ever been sexually abused/assaulted/raped as an adolescent or adult?: No Was the patient ever a victim of a crime or a disaster?: No Witnessed domestic violence?: No Has patient been affected by domestic violence as an adult?:  No  Child/Adolescent Assessment:     CCA Substance Use Alcohol/Drug Use: Alcohol / Drug Use Pain Medications: Please see MAR Prescriptions: Please see MAR Over the Counter: Please see MAR History of alcohol / drug use?: Yes Longest period of sobriety (when/how long): 8 months Negative Consequences of Use: Financial, Legal, Personal relationships, Work / School Withdrawal Symptoms: None                         ASAM's:  Six Dimensions of Multidimensional Assessment  Dimension 1:  Acute Intoxication and/or Withdrawal Potential:   Dimension 1:  Description of individual's past and current experiences of substance use and withdrawal: Current use  Dimension 2:  Biomedical Conditions and Complications:   Dimension 2:  Description of patient's biomedical conditions and  complications: None indicated  Dimension 3:  Emotional, Behavioral, or Cognitive Conditions and Complications:  Dimension 3:  Description of emotional, behavioral, or cognitive conditions and  complications: Pt stated that he has a dx of schizophrenia  Dimension 4:  Readiness to Change:  Dimension 4:  Description of Readiness to Change criteria: Pt did not indicate a desire to stop using  Dimension 5:  Relapse, Continued use, or Continued Problem Potential:  Dimension 5:  Relapse, continued use, or continued problem potential critiera description: Pt endorsed ongoing use  Dimension 6:  Recovery/Living Environment:  Dimension 6:  Recovery/Iiving environment criteria description: Supportive milieu  ASAM Severity Score: ASAM's Severity Rating Score: 11  ASAM Recommended Level of Treatment: ASAM Recommended Level of Treatment: Level I Outpatient Treatment   Substance use Disorder (SUD) Substance Use Disorder (SUD)  Checklist Symptoms of Substance Use: Continued use despite having a persistent/recurrent physical/psychological problem caused/exacerbated by use  Recommendations for  Services/Supports/Treatments: Recommendations for Services/Supports/Treatments Recommendations For Services/Supports/Treatments: ACCTT (Assertive Community Treatment)  DSM5 Diagnoses: Patient Active Problem List   Diagnosis Date Noted   Schizophrenia ([place]) 04/20/2019   Cannabis use disorder, moderate, dependence ([place]) 03/14/2016   Schizophrenia, paranoid ([place]) 05/31/2015   Tobacco use disorder 08/22/2014    Alailah Safley R [place] Cipriani, LCAS

## 2021-07-19 NOTE — ED Provider Notes (Signed)
-----------------------------------------   1:17 PM on 07/19/2021 ----------------------------------------- Patient has been seen and evaluated by psychiatry.  They believe the patient is safe for discharge home from a psychiatric standpoint.  Patient's medical work-up is largely nonrevealing besides cocaine and cannabinoid positive urine drug screen.  Patient will be discharged home with outpatient resources.   Harvest Dark, MD 07/19/21 1318

## 2021-07-19 NOTE — ED Notes (Signed)
Pt going home. Discharge teaching done and pt verbalized understanding. Given all his personal belongings. Stable, ambulatory with steady gait and in NAD. Escorted to lobby for transport home by [employer].

## 2021-07-19 NOTE — Discharge Instructions (Addendum)
You have been seen in the emergency department for a  psychiatric concern. You have been evaluated both medically as well as psychiatrically. Please follow-up with your outpatient resources provided. Return to the emergency department for any worsening symptoms, or any thoughts of hurting yourself or anyone else so that we may attempt to help you. 

## 2021-07-19 NOTE — ED Notes (Signed)
[place] department called to provide transportation for pt upon discharge

## 2021-07-19 NOTE — BH Assessment (Signed)
Writer spoke to [facility]) [place] Patterns [phone] regarding patient's injection. Writer asked if she could bring medication to hospital. Margaretha Sheffield states she will contact Dr. Rhett Bannister and call back with updates.

## 2021-07-19 NOTE — BH Assessment (Signed)
Writer informed Kimesha Patterns of patient disposition. Patient has been psych cleared and does not meet criteria for inpatient psychiatric admission. Margaretha Sheffield reports she will follow up with patient.

## 2021-07-19 NOTE — ED Notes (Signed)
Pt. To BHU from ED ambulatory without difficulty, to room  BHU 4. Report from [facility]. Pt. Is alert and oriented, warm and dry in no distress. Pt. Denies SI, HI, and AVH. Pt. Calm and cooperative. Pt. Made aware of security cameras and Q15 minute rounds. Pt. Encouraged to let Nursing staff know of any concerns or needs.    ENVIRONMENTAL ASSESSMENT Potentially harmful objects out of patient reach: Yes.   Personal belongings secured: Yes.   Patient dressed in hospital provided attire only: Yes.   Plastic bags out of patient reach: Yes.   Patient care equipment (cords, cables, call bells, lines, and drains) shortened, removed, or accounted for: Yes.   Equipment and supplies removed from bottom of stretcher: Yes.   Potentially toxic materials out of patient reach: Yes.   Sharps container removed or out of patient reach: Yes.

## 2021-07-19 NOTE — Consult Note (Signed)
[facility] Consult   Reason for Consult:Suicidal Referring Physician: Dr. Ellender Hose Patient Identification: Jeffrey Coleman MRN:  [id number] Principal Diagnosis: <principal problem not specified> Diagnosis:  Active Problems:   Tobacco use disorder   Schizophrenia, paranoid ([place])   Cannabis use disorder, moderate, dependence ([place])   Schizophrenia ([place])   Total Time spent with patient: 1 hour  Subjective: "I have been going through a lot." Jeffrey Coleman is a 40 y.o. male patient presented to [facility] ED via law enforcement under involuntary commitment status. Per the IVC paperwork, the patient has had several hospitalizations in the past three months and continues not to do well. With each hospitalizations he was admitted for seven to 14 days. It is reported that the patient uses illicit substances and is not compliant with his antipsychotic medications. It is said that the patient is on Haldol, Benzatropine, and Carbonate ER. The patient is scheduled to have his Aristada IM on 07/19/21. I will order his injection and for the nursing staff to contact his ACT team to bring it to the hospital to have it administered.  Per the IVC paperwork, the patient voiced seeing his mom and brother in bed together. During the patient assessment, he did not communicate that scenario to the psychiatric team. The patient shared that he is in the ED because he has been going through a lot. "He stated his grandmother had a stroke, and his mom has cancer. The patient was seen face-to-face by this provider; the chart was reviewed and consulted with Dr. Ellender Hose on 07/18/2021 due to the patient's care. It was discussed with the EDP that the patient does meet the criteria to be admitted to the psychiatric inpatient unit.  On evaluation, the patient is alert and oriented x 4, calm, cooperative, and mood-congruent with affect. The patient does appear to be responding to internal stimuli. The patient is presenting  with some delusional thinking. The patient denies auditory or visual hallucinations. The patient denies any suicidal, homicidal, or self-harm ideations. The patient is presenting with some psychotic and paranoid behaviors.   HPI: Per Dr. Ellender Hose, Jeffrey Coleman is a 40 y.o. male with history of schizoaffective disorder here with reported erratic behavior and paranoia.  The patient states that he is here because he called police after he found his mother and his brother in bed together.  He believes that they have been putting things in his food and that they have been sleeping together.  He states this is stressed him out and made him very depressed and felt like he wants to hurt himself.  Denies any hallucinations.  Per IVC paperwork, the patient has a history of similar presentations when he has had prior episodes of paranoia, and there is a question whether he has been taking his medications.  He has been increasingly erratic and aggressive at home.  Denies any drug or alcohol use today.  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 No pertinent surgical history. Family History:  Family History  Problem Relation Age of Onset   Cancer Father    Family Psychiatric  History:  Social History:  Social History   Substance and Sexual Activity  Alcohol Use Yes   Comment: occasional     Social History   Substance and Sexual Activity  Drug Use Yes   Types: Marijuana, Cocaine   Comment: cocaine use 07/17/21    Social History   Socioeconomic History   Marital status: Single    Spouse name: Not on file   Number of children: Not on file   Years of education: Not on file   Highest education level: Not on  file  Occupational History   Not on file  Tobacco Use   Smoking status: Every Day    Packs/day: 1.00    Years: 15.00    Pack years: 15.00    Types: Cigarettes   Smokeless tobacco: Never  Vaping Use   Vaping Use: Never used  Substance and Sexual Activity   Alcohol use: Yes    Comment: occasional   Drug use: Yes    Types: Marijuana, Cocaine    Comment: cocaine use 07/17/21   Sexual activity: Yes  Other Topics Concern   Not on file  Social History Narrative   Not on file   Social Determinants of Health   Financial Resource Strain: Not on file  Food Insecurity: Not on file  Transportation Needs: Not on file  Physical Activity: Not on file  Stress: Not on file  Social Connections: Not on file   Additional Social History:    Allergies:  No Known Allergies  Labs:  Results for orders placed or performed during the hospital encounter of 07/18/21 (from the past 48 hour(s))  Lithium level     Status: Abnormal   Collection Time: 07/18/21 10:26 PM  Result Value Ref Range   Lithium Lvl <0.06 (L) 0.60 - 1.20 mmol/L    Comment: Performed at [facility], [address], [place], [place] [address]  Comprehensive metabolic panel     Status: Abnormal   Collection Time: 07/18/21 10:26 PM  Result Value Ref Range   Sodium 138 135 - 145 mmol/L   Potassium 3.5 3.5 - 5.1 mmol/L   Chloride 106 98 - 111 mmol/L   CO2 26 22 - 32 mmol/L   Glucose, Bld 109 (H) 70 - 99 mg/dL    Comment: Glucose reference range applies only to samples taken after fasting for at least 8 hours.   BUN 6 6 - 20 mg/dL   Creatinine, Ser 0.92 0.61 - 1.24 mg/dL   Calcium 8.7 (L) 8.9 - 10.3 mg/dL   Total Protein 6.8 6.5 - 8.1 g/dL   Albumin 4.0 3.5 - 5.0 g/dL   AST 20 15 - 41 U/L   ALT 18 0 - 44 U/L   Alkaline Phosphatase 51 38 - 126 U/L   Total Bilirubin 0.8 0.3 - 1.2 mg/dL   GFR, Estimated >60 >60 mL/min    Comment: (NOTE) Calculated using the CKD-EPI Creatinine Equation (2021)    Anion gap 6 5 -  15    Comment: Performed at [facility], [address], [place], [place] [address]  Ethanol     Status: None   Collection Time: 07/18/21 10:26 PM  Result Value Ref Range   Alcohol, Ethyl (B) <10 <10 mg/dL    Comment: (NOTE) Lowest detectable limit for serum alcohol is 10 mg/dL.  For medical purposes only. Performed at [facility], [address] [place], [place] [address]   CBC with Diff     Status: Abnormal   Collection Time: 07/18/21 10:26 PM  Result Value Ref Range  WBC 9.0 4.0 - 10.5 K/uL   RBC 5.16 4.22 - 5.81 MIL/uL   Hemoglobin 15.7 13.0 - 17.0 g/dL   HCT 44.0 39.0 - 52.0 %   MCV 85.3 80.0 - 100.0 fL   MCH 30.4 26.0 - 34.0 pg   MCHC 35.7 30.0 - 36.0 g/dL   RDW 13.8 11.5 - 15.5 %   Platelets 282 150 - 400 K/uL   nRBC 0.0 0.0 - 0.2 %   Neutrophils Relative % 42 %   Neutro Abs 3.8 1.7 - 7.7 K/uL   Lymphocytes Relative 45 %   Lymphs Abs 4.1 (H) 0.7 - 4.0 K/uL   Monocytes Relative 9 %   Monocytes Absolute 0.8 0.1 - 1.0 K/uL   Eosinophils Relative 3 %   Eosinophils Absolute 0.2 0.0 - 0.5 K/uL   Basophils Relative 1 %   Basophils Absolute 0.1 0.0 - 0.1 K/uL   Immature Granulocytes 0 %   Abs Immature Granulocytes 0.02 0.00 - 0.07 K/uL    Comment: Performed at [facility], [address] [place], [place] [address]    Current Facility-Administered Medications  Medication Dose Route Frequency Provider Last Rate Last Admin   benztropine (COGENTIN) tablet 1 mg  1 mg Oral BID Duffy Bruce, MD   1 mg at 07/18/21 2257   haloperidol (HALDOL) tablet 5 mg  5 mg Oral QHS Duffy Bruce, MD   5 mg at 07/18/21 2257   lithium carbonate (ESKALITH) CR tablet 450 mg  450 mg Oral BID Duffy Bruce, MD       Current Outpatient Medications  Medication Sig Dispense Refill   ARIPiprazole ER (ABILIFY MAINTENA) 400 MG SUSR Inject 400 mg into the muscle every 30 (thirty) days. 1 each 0   ARISTADA 882 MG/3.2ML prefilled syringe Inject 882 mg into the  muscle every 30 (thirty) days.     benztropine (COGENTIN) 1 MG tablet Take 1 mg by mouth 2 (two) times daily.     cyclobenzaprine (FLEXERIL) 5 MG tablet Take 5 mg by mouth 3 (three) times daily as needed.     haloperidol (HALDOL) 5 MG tablet Take 5 mg by mouth at bedtime.     ibuprofen (ADVIL) 600 MG tablet Take 1 tablet (600 mg total) by mouth every 8 (eight) hours as needed for moderate pain. (Patient not taking: No sig reported) 30 tablet 0   lithium carbonate (ESKALITH) 450 MG CR tablet Take 450 mg by mouth 2 (two) times daily.     naproxen (NAPROSYN) 500 MG tablet Take 500 mg by mouth 2 (two) times daily.     predniSONE (STERAPRED UNI-PAK 21 TAB) 10 MG (21) TBPK tablet Take by mouth.      Musculoskeletal: Strength & Muscle Tone: within normal limits Gait & Station: normal Patient leans: Right  Psychiatric Specialty Exam:  Presentation  General Appearance: Bizarre  Eye Contact:Good  Speech:Clear and Coherent  Speech Volume:Normal  Handedness:Right   Mood and Affect  Mood:Euthymic  Affect:Inappropriate   Thought Process  Thought Processes:Disorganized  Descriptions of Associations:Circumstantial  Orientation:Full (Time, Place and Person)  Thought Content:Paranoid Ideation; Scattered  History of Schizophrenia/Schizoaffective disorder:Yes  Duration of Psychotic Symptoms:Greater than six months  Hallucinations:Hallucinations: None  Ideas of Reference:Paranoia  Suicidal Thoughts:Suicidal Thoughts: No  Homicidal Thoughts:Homicidal Thoughts: No   Sensorium  Memory:Immediate Fair; Recent Fair; Remote Fair  Judgment:Poor  Insight:Poor   Executive Functions  Concentration:Fair  Attention Span:Fair  Recall:Poor  Fund of Knowledge:Poor  Language:Fair   Psychomotor Activity  Psychomotor  Activity:Psychomotor Activity: Normal   Assets  Assets:Communication Skills; Desire for Improvement; Resilience; Social Support   Sleep  Sleep:Sleep:  Good Number of Hours of Sleep: 8   Physical Exam: Physical Exam Vitals and nursing note reviewed.  Constitutional:      Appearance: Normal appearance. He is normal weight.  HENT:     Head: Normocephalic and atraumatic.     Nose: Nose normal.  Cardiovascular:     Rate and Rhythm: Normal rate.     Pulses: Normal pulses.  Pulmonary:     Effort: Pulmonary effort is normal.  Musculoskeletal:        General: Normal range of motion.     Cervical back: Normal range of motion and neck supple.  Neurological:     General: No focal deficit present.     Mental Status: He is alert and oriented to person, place, and time.  Psychiatric:        Attention and Perception: He perceives visual hallucinations.        Mood and Affect: Mood is depressed. Affect is inappropriate.        Speech: Speech normal.        Behavior: Behavior normal.        Thought Content: Thought content is paranoid and delusional.        Cognition and Memory: Cognition and memory normal.        Judgment: Judgment is inappropriate.   ROS Blood pressure (!) 129/100, pulse 64, temperature 97.9 F (36.6 C), temperature source Oral, resp. rate 16, height 5\' 4"  (1.626 m), weight 70.3 kg, SpO2 99 %. Body mass index is 26.61 kg/m.  Treatment Plan Summary: Medication management and Plan Patient does not meet criteria for psychiatric inpatient admission  Disposition: Recommend psychiatric Inpatient admission when medically cleared. Supportive therapy provided about ongoing stressors.  Caroline Sauger, NP 07/19/2021 3:35 AM

## 2021-07-19 NOTE — Consult Note (Signed)
[facility] Consult   Reason for Consult:  see previous, re-eval Referring Physician:  EDP Patient Identification: Jeffrey Coleman MRN:  [id number] Principal Diagnosis: <principal problem not specified> Diagnosis:  Active Problems:   Tobacco use disorder   Schizophrenia, paranoid ([place])   Cannabis use disorder, moderate, dependence ([place])   Schizophrenia ([place])   Total Time spent with patient: 45 minutes  Subjective:   Jeffrey Coleman is a 40 y.o. male patient admitted with "I am fine now."  HPI: On evaluation this morning, patient is clear and coherent.  He states "I lied about my mother and brother.  He speaks in linear sentences and states that he used cocaine yesterday and it "made him paranoid." He states that he is taking his medications, but he is due for his "shot." LAI is due. [address] contacted and told he will discharge. They will follow up. Patient denies suicidal or homicidal ideations or paranoia. Denies auditory or visual hallucinations. Does not appear to be responding to internal stimuli. Writer spoke with brother, who states patient can return home   Past Psychiatric History: see previous  Risk to Self:   Risk to Others:   Prior Inpatient Therapy:   Prior Outpatient Therapy:    Past Medical History:  Past Medical History:  Diagnosis Date   Anxiety    Depression    Schizo affective schizophrenia ([place])    History reviewed. No pertinent surgical history. Family History:  Family History  Problem Relation Age of Onset   Cancer Father    Family Psychiatric  History: see previous  Social History:  Social History   Substance and Sexual Activity  Alcohol Use Yes   Comment: occasional     Social History   Substance and Sexual Activity  Drug Use Yes   Types: Marijuana, Cocaine   Comment: cocaine use 07/17/21    Social History   Socioeconomic History   Marital status: Single    Spouse name: Not on file   Number of children: Not on  file   Years of education: Not on file   Highest education level: Not on file  Occupational History   Not on file  Tobacco Use   Smoking status: Every Day    Packs/day: 1.00    Years: 15.00    Pack years: 15.00    Types: Cigarettes   Smokeless tobacco: Never  Vaping Use   Vaping Use: Never used  Substance and Sexual Activity   Alcohol use: Yes    Comment: occasional   Drug use: Yes    Types: Marijuana, Cocaine    Comment: cocaine use 07/17/21   Sexual activity: Yes  Other Topics Concern   Not on file  Social History Narrative   Not on file   Social Determinants of Health   Financial Resource Strain: Not on file  Food Insecurity: Not on file  Transportation Needs: Not on file  Physical Activity: Not on file  Stress: Not on file  Social Connections: Not on file   Additional Social History:    Allergies:  No Known Allergies  Labs:  Results for orders placed or performed during the hospital encounter of 07/18/21 (from the past 48 hour(s))  Lithium level     Status: Abnormal   Collection Time: 07/18/21 10:26 PM  Result Value Ref Range   Lithium Lvl <0.06 (L) 0.60 - 1.20 mmol/L    Comment: Performed at [facility], [address], [place], [place] [address]  Comprehensive metabolic panel  Status: Abnormal   Collection Time: 07/18/21 10:26 PM  Result Value Ref Range   Sodium 138 135 - 145 mmol/L   Potassium 3.5 3.5 - 5.1 mmol/L   Chloride 106 98 - 111 mmol/L   CO2 26 22 - 32 mmol/L   Glucose, Bld 109 (H) 70 - 99 mg/dL    Comment: Glucose reference range applies only to samples taken after fasting for at least 8 hours.   BUN 6 6 - 20 mg/dL   Creatinine, Ser 0.92 0.61 - 1.24 mg/dL   Calcium 8.7 (L) 8.9 - 10.3 mg/dL   Total Protein 6.8 6.5 - 8.1 g/dL   Albumin 4.0 3.5 - 5.0 g/dL   AST 20 15 - 41 U/L   ALT 18 0 - 44 U/L   Alkaline Phosphatase 51 38 - 126 U/L   Total Bilirubin 0.8 0.3 - 1.2 mg/dL   GFR, Estimated >60 >60 mL/min    Comment:  (NOTE) Calculated using the CKD-EPI Creatinine Equation (2021)    Anion gap 6 5 - 15    Comment: Performed at [facility], [address] [place], [place] [address]  Ethanol     Status: None   Collection Time: 07/18/21 10:26 PM  Result Value Ref Range   Alcohol, Ethyl (B) <10 <10 mg/dL    Comment: (NOTE) Lowest detectable limit for serum alcohol is 10 mg/dL.  For medical purposes only. Performed at [facility], [address] [place], [place] [address]   CBC with Diff     Status: Abnormal   Collection Time: 07/18/21 10:26 PM  Result Value Ref Range   WBC 9.0 4.0 - 10.5 K/uL   RBC 5.16 4.22 - 5.81 MIL/uL   Hemoglobin 15.7 13.0 - 17.0 g/dL   HCT 44.0 39.0 - 52.0 %   MCV 85.3 80.0 - 100.0 fL   MCH 30.4 26.0 - 34.0 pg   MCHC 35.7 30.0 - 36.0 g/dL   RDW 13.8 11.5 - 15.5 %   Platelets 282 150 - 400 K/uL   nRBC 0.0 0.0 - 0.2 %   Neutrophils Relative % 42 %   Neutro Abs 3.8 1.7 - 7.7 K/uL   Lymphocytes Relative 45 %   Lymphs Abs 4.1 (H) 0.7 - 4.0 K/uL   Monocytes Relative 9 %   Monocytes Absolute 0.8 0.1 - 1.0 K/uL   Eosinophils Relative 3 %   Eosinophils Absolute 0.2 0.0 - 0.5 K/uL   Basophils Relative 1 %   Basophils Absolute 0.1 0.0 - 0.1 K/uL   Immature Granulocytes 0 %   Abs Immature Granulocytes 0.02 0.00 - 0.07 K/uL    Comment: Performed at [facility], [address], [place],  [address]  Resp Panel by RT-PCR (Flu A&B, Covid) Nasopharyngeal Swab     Status: None   Collection Time: 07/19/21  4:05 AM   Specimen: Nasopharyngeal Swab; Nasopharyngeal(NP) swabs in vial transport medium  Result Value Ref Range   SARS Coronavirus 2 by RT PCR NEGATIVE NEGATIVE    Comment: (NOTE) SARS-CoV-2 target nucleic acids are NOT DETECTED.  The SARS-CoV-2 RNA is generally detectable in upper respiratory specimens during the acute phase of infection. The lowest concentration of SARS-CoV-2 viral copies this assay can detect is 138 copies/mL. A  negative result does not preclude SARS-Cov-2 infection and should not be used as the sole basis for treatment or other patient management decisions. A negative result may occur with  improper specimen collection/handling, submission of specimen other  than nasopharyngeal swab, presence of viral mutation(s) within the areas targeted by this assay, and inadequate number of viral copies(<138 copies/mL). A negative result must be combined with clinical observations, patient history, and epidemiological information. The expected result is Negative.  Fact Sheet for Patients:  EntrepreneurPulse.com.au  Fact Sheet for Healthcare Providers:  IncredibleEmployment.be  This test is no t yet approved or cleared by the [place] FDA and  has been authorized for detection and/or diagnosis of SARS-CoV-2 by FDA under an Emergency Use Authorization (EUA). This EUA will remain  in effect (meaning this test can be used) for the duration of the COVID-19 declaration under Section 564(b)(1) of the Act, 21 U.S.C.section 360bbb-3(b)(1), unless the authorization is terminated  or revoked sooner.       Influenza A by PCR NEGATIVE NEGATIVE   Influenza B by PCR NEGATIVE NEGATIVE    Comment: (NOTE) The Xpert Xpress SARS-CoV-2/FLU/RSV plus assay is intended as an aid in the diagnosis of influenza from Nasopharyngeal swab specimens and should not be used as a sole basis for treatment. Nasal washings and aspirates are unacceptable for Xpert Xpress SARS-CoV-2/FLU/RSV testing.  Fact Sheet for Patients: EntrepreneurPulse.com.au  Fact Sheet for Healthcare Providers: IncredibleEmployment.be  This test is not yet approved or cleared by the [place] FDA and has been authorized for detection and/or diagnosis of SARS-CoV-2 by FDA under an Emergency Use Authorization (EUA). This EUA will remain in effect (meaning this test can be used)  for the duration of the COVID-19 declaration under Section 564(b)(1) of the Act, 21 U.S.C. section 360bbb-3(b)(1), unless the authorization is terminated or revoked.  Performed at [facility], [address] [place], [place] [address]   Urine Drug Screen, Qualitative     Status: Abnormal   Collection Time: 07/19/21 11:12 AM  Result Value Ref Range   Tricyclic, Ur Screen NONE DETECTED NONE DETECTED   Amphetamines, Ur Screen NONE DETECTED NONE DETECTED   MDMA (Ecstasy)Ur Screen NONE DETECTED NONE DETECTED   Cocaine Metabolite,Ur [place] POSITIVE (A) NONE DETECTED   Opiate, Ur Screen NONE DETECTED NONE DETECTED   Phencyclidine (PCP) Ur S NONE DETECTED NONE DETECTED   Cannabinoid 50 Ng, Ur [place] POSITIVE (A) NONE DETECTED   Barbiturates, Ur Screen NONE DETECTED NONE DETECTED   Benzodiazepine, Ur Scrn NONE DETECTED NONE DETECTED   Methadone Scn, Ur NONE DETECTED NONE DETECTED    Comment: (NOTE) Tricyclics + metabolites, urine    Cutoff 1000 ng/mL Amphetamines + metabolites, urine  Cutoff 1000 ng/mL MDMA (Ecstasy), urine              Cutoff 500 ng/mL Cocaine Metabolite, urine          Cutoff 300 ng/mL Opiate + metabolites, urine        Cutoff 300 ng/mL Phencyclidine (PCP), urine         Cutoff 25 ng/mL Cannabinoid, urine                 Cutoff 50 ng/mL Barbiturates + metabolites, urine  Cutoff 200 ng/mL Benzodiazepine, urine              Cutoff 200 ng/mL Methadone, urine                   Cutoff 300 ng/mL  The urine drug screen provides only a preliminary, unconfirmed analytical test result and should not be used for non-medical purposes. Clinical consideration and professional judgment should be applied to any positive drug screen result due to possible interfering  substances. A more specific alternate chemical method must be used in order to obtain a confirmed analytical result. Gas chromatography / mass spectrometry (GC/MS) is the preferred confirm atory method. Performed at  [facility], [address] [place], [place] [address]     Current Facility-Administered Medications  Medication Dose Route Frequency Provider Last Rate Last Admin   ARIPiprazole Lauroxil ER (ARISTADA) 882 MG/3.2ML prefilled syringe 882 mg  882 mg Intramuscular Q30 days Caroline Sauger, NP       benztropine (COGENTIN) tablet 1 mg  1 mg Oral BID Duffy Bruce, MD   1 mg at 07/19/21 8502   Current Outpatient Medications  Medication Sig Dispense Refill   ARIPiprazole ER (ABILIFY MAINTENA) 400 MG SUSR Inject 400 mg into the muscle every 30 (thirty) days. 1 each 0   ARISTADA 882 MG/3.2ML prefilled syringe Inject 882 mg into the muscle every 30 (thirty) days.     benztropine (COGENTIN) 1 MG tablet Take 1 mg by mouth 2 (two) times daily.     cyclobenzaprine (FLEXERIL) 5 MG tablet Take 5 mg by mouth 3 (three) times daily as needed.     haloperidol (HALDOL) 5 MG tablet Take 5 mg by mouth at bedtime.     ibuprofen (ADVIL) 600 MG tablet Take 1 tablet (600 mg total) by mouth every 8 (eight) hours as needed for moderate pain. (Patient not taking: No sig reported) 30 tablet 0   lithium carbonate (ESKALITH) 450 MG CR tablet Take 450 mg by mouth 2 (two) times daily.     naproxen (NAPROSYN) 500 MG tablet Take 500 mg by mouth 2 (two) times daily.     predniSONE (STERAPRED UNI-PAK 21 TAB) 10 MG (21) TBPK tablet Take by mouth.      Musculoskeletal: Strength & Muscle Tone: within normal limits Gait & Station: normal Patient leans: N/A            Psychiatric Specialty Exam:  Presentation  General Appearance: Appropriate for Environment  Eye Contact:Good  Speech:Clear and Coherent  Speech Volume:Normal  Handedness:Right   Mood and Affect  Mood:Euthymic  Affect:Appropriate   Thought Process  Thought Processes:Coherent  Descriptions of Associations:Intact  Orientation:Full (Time, Place and Person)  Thought Content:Logical  History of  Schizophrenia/Schizoaffective disorder:Yes  Duration of Psychotic Symptoms:Greater than six months  Hallucinations:Hallucinations: None  Ideas of Reference:None  Suicidal Thoughts:Suicidal Thoughts: No  Homicidal Thoughts:Homicidal Thoughts: No   Sensorium  Memory:Immediate Good  Judgment:Fair  Insight:Fair   Executive Functions  Concentration:Good  Attention Span:Good  Recall:Good  Fund of Knowledge:Fair  Language:Fair   Psychomotor Activity  Psychomotor Activity:Psychomotor Activity: Normal   Assets  Assets:Housing; Social Support; Physical Health; Resilience   Sleep  Sleep:Sleep: Good Number of Hours of Sleep: 8   Physical Exam: Physical Exam Vitals and nursing note reviewed.  HENT:     Head: Normocephalic.     Nose: No congestion or rhinorrhea.  Eyes:     General:        Right eye: No discharge.        Left eye: No discharge.  Cardiovascular:     Rate and Rhythm: Normal rate.  Pulmonary:     Effort: Pulmonary effort is normal.  Musculoskeletal:        General: Normal range of motion.     Cervical back: Normal range of motion.  Skin:    General: Skin is dry.  Neurological:     Mental Status: He is alert.  Psychiatric:  Behavior: Behavior normal.   Review of Systems  Psychiatric/Behavioral:  Negative for depression, hallucinations, memory loss, substance abuse and suicidal ideas. The patient is not nervous/anxious and does not have insomnia.   All other systems reviewed and are negative. Blood pressure 119/77, pulse 92, temperature 99.5 F (37.5 C), temperature source Oral, resp. rate 18, height 5\' 4"  (1.626 m), weight 70.3 kg, SpO2 97 %. Body mass index is 26.61 kg/m.  Treatment Plan Summary: Plan Patient will be discharged home. [facility] follow up with [facility]. Discussed with EDP  Disposition: No evidence of imminent risk to self or others at present.   Supportive therapy provided about ongoing stressors. Discussed crisis  plan, support from social network, calling 911, coming to the Emergency Department, and calling Suicide Hotline.  Sherlon Handing, NP 07/19/2021 7:08 PM

## 2021-07-19 NOTE — ED Notes (Signed)
IVC/Consult completed/Pending Placement/Moved to BHU-4

## 2021-08-06 ENCOUNTER — Encounter (HOSPITAL_COMMUNITY): Payer: Self-pay | Admitting: *Deleted

## 2021-08-06 ENCOUNTER — Emergency Department (HOSPITAL_COMMUNITY)
Admission: EM | Admit: 2021-08-06 | Discharge: 2021-08-07 | Disposition: A | Discharge status: Home / Self Care | Payer: No Typology Code available for payment source | Attending: Emergency Medicine | Admitting: Emergency Medicine

## 2021-08-06 DIAGNOSIS — F17210 Nicotine dependence, cigarettes, uncomplicated: Secondary | ICD-10-CM | POA: Diagnosis not present

## 2021-08-06 DIAGNOSIS — G4700 Insomnia, unspecified: Secondary | ICD-10-CM | POA: Insufficient documentation

## 2021-08-06 DIAGNOSIS — Z79899 Other long term (current) drug therapy: Secondary | ICD-10-CM | POA: Insufficient documentation

## 2021-08-06 DIAGNOSIS — Y900 Blood alcohol level of less than 20 mg/100 ml: Secondary | ICD-10-CM | POA: Diagnosis not present

## 2021-08-06 DIAGNOSIS — F1090 Alcohol use, unspecified, uncomplicated: Secondary | ICD-10-CM | POA: Insufficient documentation

## 2021-08-06 DIAGNOSIS — F209 Schizophrenia, unspecified: Secondary | ICD-10-CM | POA: Diagnosis present

## 2021-08-06 DIAGNOSIS — R45851 Suicidal ideations: Secondary | ICD-10-CM | POA: Diagnosis not present

## 2021-08-06 LAB — RAPID URINE DRUG SCREEN, HOSP PERFORMED
Amphetamines: NOT DETECTED
Barbiturates: NOT DETECTED
Benzodiazepines: NOT DETECTED
Cocaine: POSITIVE — AB
Opiates: NOT DETECTED
Tetrahydrocannabinol: POSITIVE — AB

## 2021-08-06 LAB — SALICYLATE LEVEL: Salicylate Lvl: <7 mg/dL — ABNORMAL LOW (ref 7.0–30.0)

## 2021-08-06 LAB — COMPREHENSIVE METABOLIC PANEL
ALT: 17 U/L (ref 0–44)
AST: 20 U/L (ref 15–41)
Albumin: 4.1 g/dL (ref 3.5–5.0)
Alkaline Phosphatase: 51 U/L (ref 38–126)
Anion gap: 9 (ref 5–15)
BUN: 10 mg/dL (ref 6–20)
CO2: 29 mmol/L (ref 22–32)
Calcium: 9.3 mg/dL (ref 8.9–10.3)
Chloride: 100 mmol/L (ref 98–111)
Creatinine, Ser: 0.89 mg/dL (ref 0.61–1.24)
GFR, Estimated: >60 mL/min (ref >60)
Glucose, Bld: 90 mg/dL (ref 70–99)
Potassium: 3.9 mmol/L (ref 3.5–5.1)
Sodium: 138 mmol/L (ref 135–145)
Total Bilirubin: 0.3 mg/dL (ref 0.3–1.2)
Total Protein: 7.3 g/dL (ref 6.5–8.1)

## 2021-08-06 LAB — CBC
HCT: 46.6 % (ref 39.0–52.0)
Hemoglobin: 16.5 g/dL (ref 13.0–17.0)
MCH: 30.8 pg (ref 26.0–34.0)
MCHC: 35.4 g/dL (ref 30.0–36.0)
MCV: 86.9 fL (ref 80.0–100.0)
Platelets: 304 10*3/uL (ref 150–400)
RBC: 5.36 MIL/uL (ref 4.22–5.81)
RDW: 13.8 % (ref 11.5–15.5)
WBC: 8.8 10*3/uL (ref 4.0–10.5)
nRBC: 0 % (ref 0.0–0.2)

## 2021-08-06 LAB — ETHANOL: Alcohol, Ethyl (B): <10 mg/dL (ref <10)

## 2021-08-06 LAB — ACETAMINOPHEN LEVEL: Acetaminophen (Tylenol), Serum: <10 ug/mL — ABNORMAL LOW (ref 10–30)

## 2021-08-06 MED ORDER — LITHIUM CARBONATE ER 450 MG PO TBCR
450.0000 mg | EXTENDED_RELEASE_TABLET | Freq: Two times a day (BID) | ORAL | Status: DC
  Administered 2021-08-07: 450 mg via ORAL
  Filled 2021-08-06: qty 1

## 2021-08-06 MED ORDER — BENZTROPINE MESYLATE 1 MG PO TABS
2.0000 mg | ORAL_TABLET | Freq: Every day | ORAL | Status: DC
  Administered 2021-08-07: 2 mg via ORAL
  Filled 2021-08-06: qty 2

## 2021-08-06 MED ORDER — HALOPERIDOL 5 MG PO TABS: 5.0000 mg | ORAL_TABLET | Freq: Every day | ORAL | Status: DC

## 2021-08-06 NOTE — ED Provider Notes (Signed)
[facility] EMERGENCY DEPARTMENT Provider Note   CSN: [id number] Arrival date & time: 08/06/21  1432     History Chief Complaint  Patient presents with   Suicidal    Jeffrey Coleman is a 40 y.o. male with a history of anxiety, depression, schizoaffective schizophrenia.  Presents to the emergency department with a chief complaint of suicidal ideation.  Patient reports that he has had suicidal ideations over the last 3 days.  Patient has plan of a "suicide by cop."  Patient states that he "came here because I do not want to go through with it."  Patient also endorses insomnia.  States that he has had trouble sleeping over the last few days.  Endorses increased stress due to "family problems."  Patient endorses alcohol use stating he drinks approximately 40 ounces of beer weekly.  Patient endorses marijuana and cocaine use.  Last used cocaine Friday night.  Patient denies any homicidal ideations, auditory hallucinations, or visual hallucinations.  Patient states that he has been getting his monthly Abilify shot and has not missed any doses.  Patient denies any previous suicide attempts.  Patient states that he has had suicidal ideations in the past.  HPI     Past Medical History:  Diagnosis Date   Anxiety    Depression    Schizo affective schizophrenia [facility])     Patient Active Problem List   Diagnosis Date Noted   Schizophrenia ([place]) 04/20/2019   Cannabis use disorder, moderate, dependence ([place]) 03/14/2016   Schizophrenia, paranoid ([place]) 05/31/2015   Tobacco use disorder 08/22/2014    History reviewed. No pertinent surgical history.     Family History  Problem Relation Age of Onset   Cancer Father     Social History   Tobacco Use   Smoking status: Every Day    Packs/day: 1.00    Years: 15.00    Pack years: 15.00    Types: Cigarettes   Smokeless tobacco: Never  Vaping Use   Vaping Use: Never used  Substance Use Topics   Alcohol use: Yes    Comment: 40oz weekly    Drug use: Yes    Types: Marijuana, Cocaine    Comment: last cocaine use 08/03/21    Home Medications Prior to Admission medications   Medication Sig Start Date End Date Taking? Authorizing Provider  ARIPiprazole ER (ABILIFY MAINTENA) 400 MG SUSR Inject 400 mg into the muscle every 30 (thirty) days. 03/15/16   Hildred Priest, MD  ARISTADA 882 MG/3.2ML prefilled syringe Inject 882 mg into the muscle every 30 (thirty) days. 04/17/21   [provider]  benztropine (COGENTIN) 1 MG tablet Take 1 mg by mouth 2 (two) times daily. 05/01/21   [provider]  cyclobenzaprine (FLEXERIL) 5 MG tablet Take 5 mg by mouth 3 (three) times daily as needed. 05/15/21   [provider]  haloperidol (HALDOL) 5 MG tablet Take 5 mg by mouth at bedtime. 04/11/21   [provider]  ibuprofen (ADVIL) 600 MG tablet Take 1 tablet (600 mg total) by mouth every 8 (eight) hours as needed for moderate pain. Patient not taking: No sig reported 04/15/21   Evalee Jefferson, PA-C  lithium carbonate (ESKALITH) 450 MG CR tablet Take 450 mg by mouth 2 (two) times daily. 05/11/21   [provider]  naproxen (NAPROSYN) 500 MG tablet Take 500 mg by mouth 2 (two) times daily. 05/15/21   [provider]  predniSONE (STERAPRED UNI-PAK 21 TAB) 10 MG (21) TBPK tablet Take by  mouth. 05/15/21   [provider]    Allergies    Patient has no known allergies.  Review of Systems   Review of Systems  Constitutional:  Negative for chills and fever.  Eyes:  Negative for visual disturbance.  Respiratory:  Negative for shortness of breath.   Cardiovascular:  Negative for chest pain.  Gastrointestinal:  Negative for abdominal pain, nausea and vomiting.  Genitourinary:  Negative for difficulty urinating and dysuria.  Musculoskeletal:  Negative for back pain and neck pain.  Skin:  Negative for color change and rash.  Neurological:  Negative for dizziness, syncope, light-headedness  and headaches.  Psychiatric/Behavioral:  Positive for sleep disturbance and suicidal ideas. Negative for confusion, hallucinations and self-injury.    Physical Exam Updated Vital Signs BP (!) 155/110   Pulse 84   Temp 98.4 F (36.9 C) (Oral)   Resp 16   SpO2 100%   Physical Exam Vitals and nursing note reviewed.  Constitutional:      General: He is not in acute distress.    Appearance: He is not ill-appearing, toxic-appearing or diaphoretic.  HENT:     Head: Normocephalic.  Eyes:     General: No scleral icterus.       Right eye: No discharge.        Left eye: No discharge.  Cardiovascular:     Rate and Rhythm: Normal rate.  Pulmonary:     Effort: Pulmonary effort is normal.  Skin:    General: Skin is warm and dry.  Neurological:     General: No focal deficit present.     Mental Status: He is alert and oriented to person, place, and time.     GCS: GCS eye subscore is 4. GCS verbal subscore is 5. GCS motor subscore is 6.  Psychiatric:        Attention and Perception: He is attentive. He does not perceive auditory or visual hallucinations.        Speech: Speech normal.        Behavior: Behavior is cooperative.        Thought Content: Thought content is not paranoid or delusional. Thought content includes suicidal ideation. Thought content does not include homicidal ideation. Thought content includes suicidal plan. Thought content does not include homicidal plan.    ED Results / Procedures / Treatments   Labs (all labs ordered are listed, but only abnormal results are displayed) Labs Reviewed  SALICYLATE LEVEL - Abnormal; Notable for the following components:      Result Value   Salicylate Lvl <7.9 (*)    All other components within normal limits  ACETAMINOPHEN LEVEL - Abnormal; Notable for the following components:   Acetaminophen (Tylenol), Serum <10 (*)    All other components within normal limits  RAPID URINE DRUG SCREEN, HOSP PERFORMED - Abnormal; Notable for the  following components:   Cocaine POSITIVE (*)    Tetrahydrocannabinol POSITIVE (*)    All other components within normal limits  RESP PANEL BY RT-PCR (FLU A&B, COVID) ARPGX2  COMPREHENSIVE METABOLIC PANEL  ETHANOL  CBC    EKG None  Radiology No results found.  Procedures Procedures   Medications Ordered in ED Medications  benztropine (COGENTIN) tablet 2 mg (has no administration in time range)  haloperidol (HALDOL) tablet 5 mg (has no administration in time range)  lithium carbonate (ESKALITH) CR tablet 450 mg (has no administration in time range)    ED Course  I have reviewed the triage vital signs and the  nursing notes.  Pertinent labs & imaging results that were available during my care of the patient were reviewed by me and considered in my medical decision making (see chart for details).    MDM Rules/Calculators/A&P                           Alert 40 year old male no acute stress, nontoxic-appearing.  Presents to ED with chief complaint of suicidal ideations.  Patient has plan of "suicide by cop."  Patient states that he came to the emergency department today before going through this plan.  Denies any previous suicide attempts.  Patient has history of schizoaffective schizophrenia.  States that he has been getting his Abilify shot monthly without missing doses.  Denies any auditory or visual loose Nations at this time.  Patient endorses marijuana use and cocaine use.  Patient endorses drinking 40 ounces of beer weekly.  Patient medically cleared at this time.  Will place TTS consult.  Patient is here voluntarily however if he attempts to leave would need to be IVC.  This information was relayed to patient's nurse.  1730 contacted by Contacted by Krystal Eaton, [facility] with TTS who reports CTS group is awaiting collateral from his act team.  Plan is to psych cleared him pending this information.  Patient will be kept in psych hold and observe until he can be cleared.     Home medications ordered.  Final Clinical Impression(s) / ED Diagnoses Final diagnoses:  None    Rx / DC Orders ED Discharge Orders     None        Dyann Ruddle 08/06/21 2351    Milton Ferguson, MD 08/08/21 1110

## 2021-08-06 NOTE — ED Triage Notes (Addendum)
Pt brought in by [facility] from home with c/o suicidal ideation. Pt reports he has a lot of stress going on at home because his mother and grandmother are sick and he feels like everyone is relying on him. He denies plan but does feel as if he would act upon a plan if he had one. Denies ever attempting suicide, but does report hx of suicidal thoughts. Mental health hx of anxiety, depression and schizophrenia. Pt reports he gets a shot monthly for maintenance and hasn't missed a dose. Pt calm and cooperative during triage.

## 2021-08-06 NOTE — BH Assessment (Signed)
Comprehensive Clinical Assessment (CCA) Note  08/06/2021 Jeffrey Coleman [id number]  Disposition:  Gave clinical disposition to Oneida Alar, NP, who determined that Pt is psych-cleared pending collateral/follow-up plan with Pt's [place] Team.  The patient demonstrates the following risk factors for suicide: Chronic risk factors for suicide include: psychiatric disorder of Schizophrenia and substance use disorder. Acute risk factors for suicide include:  illness in family . Protective factors for this patient include: positive social support and positive therapeutic relationship. Considering these factors, the overall suicide risk at this point appears to be moderate. Patient is appropriate for outpatient follow up.   [place] ED from 08/06/2021 in [place] ED from 07/18/2021 in [place] ED from 05/17/2021 in [facility] [facility] CATEGORY Moderate Risk No Risk Moderate Risk       Chief Complaint:  Chief Complaint  Patient presents with   Suicidal    Pt endorsed suicidal ideation with method but no specific plan    Visit Diagnosis: Schizophrenia   Narrative:  Pt is a 40 year old male who presented to [facility] with complaint of suicidal ideation.  Pt lives in [place] with his mother, and he receives disability due to Schizophrenia.  Pt receives outpatient psychiatric services through [facility].  Pt stated that he is compliant with medication.  Pt reported that he feels suicidal due to stressors over the last week -- his grandmother had two strokes, and his mother is also ill.  Pt reported that he does not have immediate plans to have himself, but if he were to harm himself, he would plan a ''suicide by cop'' scenario in which he would charge at a police officer with a screwdriver.  Pt endorsed ongoing suicidal ideation, despondency, insomnia, and feelings of hopelessness.  He  also endorsed recent use of marijuana and cocaine.  UDS was positive for THC and cocaine.  Pt said that his most recent use was on 08/06/2021.  Pt denied current hallucination (he is free from hallucinations when on medication), homicidal ideation, and self-injurious behavior.  During assessment, Pt presented as alert and oriented.  He had good eye contact and was cooperative.  Demeanor was calm.  Pt was appropriately groomed.  Pt's mood was depressed.  Affect was full range.  Speech was normal in rate, rhythm, and volume.  Thought processes were within normal range, and thought content was logical and goal oriented.  There was no evidence of delusion.  Memory and concentration were intact.  Insight, judgment, and impulse control were fair.  CCA Screening, Triage and Referral (STR)  Patient Reported Information How did you hear about [place]? Self  What Is the Reason for Your Visit/Call Today? Pt reported that for three days, he has felt suicidal due to recent illness of family members.  He endorsed suicidal ideation with method.  Pt also endorsed despondency.  How Long Has This Been Causing You Problems? <Week  What Do You Feel Would Help You the Most Today? Treatment for Depression or other mood problem   Have You Recently Had Any Thoughts About Hurting Yourself? Yes  Are You Planning to Commit Suicide/Harm Yourself At This time? No   Have you Recently Had Thoughts About [place]? No  Are You Planning to Harm Someone at This Time? No  Explanation: No data recorded  Have You Used Any Alcohol or Drugs in the Past 24 Hours? Yes  How Long Ago Did  You Use Drugs or Alcohol? No data recorded What Did You Use and How Much? Marijuana; cocaine   Do You Currently Have a Therapist/Psychiatrist? Yes  Name of Therapist/Psychiatrist: [facility]   Have You Been Recently Discharged From Any Office Practice or Programs? No  Explanation of Discharge From Practice/Program: No  data recorded    CCA Screening Triage Referral Assessment Type of Contact: Tele-Assessment  Telemedicine Service Delivery: Telemedicine service delivery: This service was provided via telemedicine using a 2-way, interactive audio and video technology  Is this Initial or Reassessment? Initial Assessment  Date Telepsych consult ordered in [facility]:  08/06/21  Time Telepsych consult ordered in [facility]:  No data recorded Location of Assessment: [facility]  Provider Location: [facility]   Collateral Involvement: [place] provider   Does Patient Have a [place]? No data recorded Name and Contact of Legal Guardian: No data recorded If Minor and Not Living with Parent(s), Who has Custody? No data recorded Is CPS involved or ever been involved? Never  Is APS involved or ever been involved? Never   Patient Determined To Be At Risk for Harm To Self or Others Based on Review of Patient Reported Information or Presenting Complaint? Yes, for Self-Harm  Method: No data recorded Availability of Means: No data recorded Intent: No data recorded Notification Required: No data recorded Additional Information for Danger to Others Potential: No data recorded Additional Comments for Danger to Others Potential: No data recorded Are There Guns or Other Weapons in Your Home? No data recorded Types of Guns/Weapons: No data recorded Are These Weapons Safely Secured?                            No data recorded Who Could Verify You Are Able To Have These Secured: No data recorded Do You Have any Outstanding Charges, Pending Court Dates, Parole/Probation? No data recorded Contacted To Inform of Risk of Harm To Self or Others: No data recorded   Does Patient Present under Involuntary Commitment? No  IVC Papers Initial File Date: 07/19/21   [place] of Residence: Other (Comment) ([place])   Patient Currently Receiving the Following  Services: ACTT Architect)   Determination of Need: Urgent (48 hours)   Options For Referral: Inpatient Hospitalization; Medication Management; Outpatient Therapy     CCA Biopsychosocial Patient Reported Schizophrenia/Schizoaffective Diagnosis in Past: Yes   Strengths: Pt has insight, receives support services   Mental Health Symptoms Depression:   Change in energy/activity; Hopelessness; Difficulty Concentrating; Sleep (too much or little)   Duration of Depressive symptoms:  Duration of Depressive Symptoms: Less than two weeks   Mania:   None   Anxiety:    Worrying; Tension   Psychosis:   None (No psychotic symptoms when on medication)   Duration of Psychotic symptoms:  Duration of Psychotic Symptoms: N/A   Trauma:   None   Obsessions:   None   Compulsions:   None   Inattention:   None   Hyperactivity/Impulsivity:   None   Oppositional/Defiant Behaviors:   None   Emotional Irregularity:   Mood lability   Other Mood/Personality Symptoms:  No data recorded   Mental Status Exam Appearance and self-care  Stature:   Average   Weight:   Average weight   Clothing:   Casual   Grooming:   Normal   Cosmetic use:   None  Posture/gait:   Normal   Motor activity:   Not Remarkable   Sensorium  Attention:   Normal   Concentration:   Normal   Orientation:   X5   Recall/memory:   Normal   Affect and Mood  Affect:   Appropriate   Mood:   Dysphoric   Relating  Eye contact:   Normal   Facial expression:   Responsive   Attitude toward examiner:   Cooperative   Thought and Language  Speech flow:  Normal   Thought content:   Appropriate to Mood and Circumstances   Preoccupation:   None   Hallucinations:   None   Organization:  No data recorded  [employer] of Knowledge:   Average   Intelligence:   Average   Abstraction:   Functional   Judgement:   Poor   Reality  Testing:   Adequate   Insight:   Poor   Decision Making:   Paralyzed   Social Functioning  Social Maturity:   Isolates   Social Judgement:   Impropriety   Stress  Stressors:   Illness (Pt's grandmother had two strokes this week; mother is also sick)   Coping Ability:   Programme researcher, broadcasting/film/video Deficits:   Activities of daily living   Supports:   Family; Friends/Service system     Religion: Religion/Spirituality Are You A Religious Person?: No  Leisure/Recreation: Leisure / Recreation Do You Have Hobbies?: No  Exercise/Diet: Exercise/Diet Do You Exercise?: No Have You Gained or Lost A Significant Amount of Weight in the Past Six Months?: No Do You Follow a Special Diet?: No Do You Have Any Trouble Sleeping?: No   CCA Employment/Education Employment/Work Situation: Employment / Work Situation Employment Situation: On disability How Long has Patient Been on Disability: since 2012 Patient's Job has Been Impacted by Current Illness: No Has Patient ever Been in the [employer]?: No  Education: Education Is Patient Currently Attending School?: No Did You Have An Individualized Education Program (IIEP): No Did You Have Any Difficulty At [employer]?: No Patient's Education Has Been Impacted by Current Illness: No   CCA Family/Childhood History Family and Relationship History: Family history Marital status: Single Does patient have children?: Yes How many children?: 1 How is patient's relationship with their children?: Distant  Childhood History:  Childhood History By whom was/is the patient raised?: Mother Did patient suffer any verbal/emotional/physical/sexual abuse as a child?: No Did patient suffer from severe childhood neglect?: No Has patient ever been sexually abused/assaulted/raped as an adolescent or adult?: No Was the patient ever a victim of a crime or a disaster?: No Witnessed domestic violence?: No Has patient been affected by domestic violence as  an adult?: No  Child/Adolescent Assessment:     CCA Substance Use Alcohol/Drug Use: Alcohol / Drug Use Pain Medications: Please see MAR Prescriptions: Please see MAR Over the Counter: Please see MAR History of alcohol / drug use?: Yes Negative Consequences of Use: Financial, Legal, Personal relationships, Work / School Withdrawal Symptoms: None Substance #1 Name of Substance 1: Marijuana 1 - Amount (size/oz): Varied 1 - Last Use / Amount: 08/05/2021 Substance #2 Name of Substance 2: cocaine 2 - Amount (size/oz): ''just a taste'' 2 - Last Use / Amount: 08/05/2021                     ASAM's:  Six Dimensions of Multidimensional Assessment  Dimension 1:  Acute Intoxication and/or Withdrawal Potential:      Dimension 2:  Biomedical Conditions and Complications:   Dimension 2:  Description of patient's biomedical conditions and  complications: None indicated  Dimension 3:  Emotional, Behavioral, or Cognitive Conditions and Complications:  Dimension 3:  Description of emotional, behavioral, or cognitive conditions and complications: Pt stated that he has a dx of schizophrenia, endorsed suicidal idetion  Dimension 4:  Readiness to Change:  Dimension 4:  Description of Readiness to Change criteria: Pt did not indicate a desire to stop using  Dimension 5:  Relapse, Continued use, or Continued Problem Potential:  Dimension 5:  Relapse, continued use, or continued problem potential critiera description: Pt endorsed ongoing use  Dimension 6:  Recovery/Living Environment:  Dimension 6:  Recovery/Iiving environment criteria description: Supportive milieu  ASAM Severity Score:    ASAM Recommended Level of Treatment: ASAM Recommended Level of Treatment: Level I Outpatient Treatment   Substance use Disorder (SUD) Substance Use Disorder (SUD)  Checklist Symptoms of Substance Use: Continued use despite having a persistent/recurrent physical/psychological problem caused/exacerbated by  use  Recommendations for Services/Supports/Treatments:    Discharge Disposition:    DSM5 Diagnoses: Patient Active Problem List   Diagnosis Date Noted   Schizophrenia ([place]) 04/20/2019   Cannabis use disorder, moderate, dependence ([place]) 03/14/2016   Schizophrenia, paranoid ([place]) 05/31/2015   Tobacco use disorder 08/22/2014     Referrals to Alternative Service(s): Referred to Alternative Service(s):   Place:   Date:   Time:    Referred to Alternative Service(s):   Place:   Date:   Time:    Referred to Alternative Service(s):   Place:   Date:   Time:    Referred to Alternative Service(s):   Place:   Date:   Time:     Marlowe Aschoff, [facility]

## 2021-08-06 NOTE — BH Assessment (Signed)
Author attempted to reach Pt's [facility] Team representative to discuss safety planning and possible discharge.  Author first attempted to reach ACT team member listed in previous encounters -- [place], [phone].  There was no answer, and no voicemail on which to leave a message.  Author contacted the [facility] Team crisis line at [phone] and left a hipaa-compliant message.

## 2021-08-06 NOTE — ED Notes (Signed)
Pt dressed out into burgundy scrubs. Pt's belongings (black jacket, black tshirt, gray sweatpants, gold colored sandals, black socks) placed in personal belongings bag and placed in locker. Security called to wand pt.

## 2021-08-07 NOTE — ED Notes (Signed)
Attempted to call pt.'s mother to pick him up, was unable to get in touch with her at this time

## 2021-08-07 NOTE — ED Provider Notes (Signed)
Emergency Medicine Observation Re-evaluation Note  Jeffrey Coleman is a 40 y.o. male, seen on rounds today.  Pt initially presented to the ED for complaints of Suicidal (Pt endorsed suicidal ideation with method but no specific plan ) Currently, the patient is resting quietly.  Physical Exam  BP 116/86 (BP Location: Left Arm)   Pulse (!) 114   Temp 97.9 F (36.6 C) (Oral)   Resp 16   SpO2 97%  Physical Exam General: No acute distress Cardiac: Well-perfused Lungs: Nonlabored Psych: Cooperative  ED Course / MDM  EKG:   I have reviewed the labs performed to date as well as medications administered while in observation.  Recent changes in the last 24 hours include psychiatry evaluation.  Plan  Current plan is for psychiatry attempting to reach out to patient's act team.  Jeffrey Coleman is not under involuntary commitment.     Hayden Rasmussen, MD 08/07/21 1739

## 2021-08-07 NOTE — ED Provider Notes (Signed)
3:45 PM-he has been seen by TTS and cleared.  Outpatient follow-up planned.  See their note; extracted, comment:  Pt to discharge on today, CSW has notified ACTT who will schedule visit for tomorrow (08/08/2021). Jinny Blossom, NP made aware.  Patient agreeable.   Daleen Bo, MD 08/07/21 [phone]

## 2021-08-07 NOTE — Discharge Instructions (Addendum)
Your crisis team will evaluate you tomorrow.  Take your medicine as usual

## 2021-08-07 NOTE — ED Notes (Signed)
Transportation for patient arranged, [facility] to arrive at 1745 to take patient home.

## 2021-08-07 NOTE — Progress Notes (Addendum)
CSW contacted [place] [phone] who reported pt continues to have services and has had services for 7 years. ACTT reported that pt had a court date today (08/07/2021) and also has one on 08/09/2021 in [place]. ACTT reported that when pt is discharged they will see the following day. Pt to discharge on today, CSW has notified ACTT who will schedule visit for tomorrow (08/08/2021). Jinny Blossom, NP made aware.  Charlene Brooke, MSW, [employer]

## 2021-08-07 NOTE — ED Notes (Signed)
Safe transport called to transport patient home. RN notified.

## 2021-10-10 ENCOUNTER — Emergency Department (HOSPITAL_COMMUNITY)
Admission: EM | Admit: 2021-10-10 | Discharge: 2021-10-10 | Disposition: A | Discharge status: Home / Self Care | Payer: No Typology Code available for payment source | Attending: Student | Admitting: Student

## 2021-10-10 ENCOUNTER — Other Ambulatory Visit: Payer: Self-pay

## 2021-10-10 ENCOUNTER — Encounter (HOSPITAL_COMMUNITY): Payer: Self-pay | Admitting: Emergency Medicine

## 2021-10-10 DIAGNOSIS — F1910 Other psychoactive substance abuse, uncomplicated: Secondary | ICD-10-CM

## 2021-10-10 DIAGNOSIS — F17210 Nicotine dependence, cigarettes, uncomplicated: Secondary | ICD-10-CM | POA: Diagnosis not present

## 2021-10-10 DIAGNOSIS — F209 Schizophrenia, unspecified: Secondary | ICD-10-CM | POA: Insufficient documentation

## 2021-10-10 DIAGNOSIS — F1220 Cannabis dependence, uncomplicated: Secondary | ICD-10-CM

## 2021-10-10 DIAGNOSIS — F418 Other specified anxiety disorders: Secondary | ICD-10-CM | POA: Insufficient documentation

## 2021-10-10 DIAGNOSIS — R45851 Suicidal ideations: Secondary | ICD-10-CM | POA: Insufficient documentation

## 2021-10-10 DIAGNOSIS — R569 Unspecified convulsions: Secondary | ICD-10-CM

## 2021-10-10 DIAGNOSIS — F259 Schizoaffective disorder, unspecified: Secondary | ICD-10-CM

## 2021-10-10 DIAGNOSIS — Z046 Encounter for general psychiatric examination, requested by authority: Secondary | ICD-10-CM | POA: Diagnosis present

## 2021-10-10 LAB — COMPREHENSIVE METABOLIC PANEL
ALT: 25 U/L (ref 0–44)
AST: 68 U/L — ABNORMAL HIGH (ref 15–41)
Albumin: 4.2 g/dL (ref 3.5–5.0)
Alkaline Phosphatase: 51 U/L (ref 38–126)
Anion gap: 11 (ref 5–15)
BUN: 16 mg/dL (ref 6–20)
CO2: 22 mmol/L (ref 22–32)
Calcium: 8.8 mg/dL — ABNORMAL LOW (ref 8.9–10.3)
Chloride: 104 mmol/L (ref 98–111)
Creatinine, Ser: 1.02 mg/dL (ref 0.61–1.24)
GFR, Estimated: >60 mL/min (ref >60)
Glucose, Bld: 110 mg/dL — ABNORMAL HIGH (ref 70–99)
Potassium: 3.5 mmol/L (ref 3.5–5.1)
Sodium: 137 mmol/L (ref 135–145)
Total Bilirubin: 0.8 mg/dL (ref 0.3–1.2)
Total Protein: 7.3 g/dL (ref 6.5–8.1)

## 2021-10-10 LAB — CBC WITH DIFFERENTIAL/PLATELET
Abs Immature Granulocytes: 0.07 10*3/uL (ref 0.00–0.07)
Basophils Absolute: 0.1 10*3/uL (ref 0.0–0.1)
Basophils Relative: 1 %
Eosinophils Absolute: 0.1 10*3/uL (ref 0.0–0.5)
Eosinophils Relative: 0 %
HCT: 44 % (ref 39.0–52.0)
Hemoglobin: 15.7 g/dL (ref 13.0–17.0)
Immature Granulocytes: 1 %
Lymphocytes Relative: 30 %
Lymphs Abs: 4.5 10*3/uL — ABNORMAL HIGH (ref 0.7–4.0)
MCH: 29.8 pg (ref 26.0–34.0)
MCHC: 35.7 g/dL (ref 30.0–36.0)
MCV: 83.5 fL (ref 80.0–100.0)
Monocytes Absolute: 1.1 10*3/uL — ABNORMAL HIGH (ref 0.1–1.0)
Monocytes Relative: 7 %
Neutro Abs: 9.3 10*3/uL — ABNORMAL HIGH (ref 1.7–7.7)
Neutrophils Relative %: 61 %
Platelets: 302 10*3/uL (ref 150–400)
RBC: 5.27 MIL/uL (ref 4.22–5.81)
RDW: 13.9 % (ref 11.5–15.5)
WBC: 15.1 10*3/uL — ABNORMAL HIGH (ref 4.0–10.5)
nRBC: 0 % (ref 0.0–0.2)

## 2021-10-10 LAB — RAPID URINE DRUG SCREEN, HOSP PERFORMED
Amphetamines: NOT DETECTED
Barbiturates: NOT DETECTED
Benzodiazepines: NOT DETECTED
Cocaine: POSITIVE — AB
Opiates: NOT DETECTED
Tetrahydrocannabinol: POSITIVE — AB

## 2021-10-10 LAB — ETHANOL: Alcohol, Ethyl (B): <10 mg/dL (ref <10)

## 2021-10-10 NOTE — ED Notes (Signed)
Pt wanded by security, belongings placed in locker. Valuables given to security.

## 2021-10-10 NOTE — ED Provider Notes (Signed)
[facility] EMERGENCY DEPARTMENT Provider Note  CSN: [id number] Arrival date & time: 10/10/21 0246  Chief Complaint(s) V70.1  HPI Jeffrey Coleman is a 41 y.o. male with PMH anxiety, depression, schizoaffective schizophrenia who presents emergency department for evaluation of suicidal ideation.  Patient states that for the last 2 to 3 days he has had worsening thoughts of suicide and currently has a plan to [employer] and die by suicide by cop.  He states he has been compliant with his home medication and denies auditory or visual hallucinations.  Denies homicidal ideation.  Denies illicit substance use today.  Denies chest pain, shortness of breath, abdominal pain with nausea, vomiting or other systemic symptoms.  HPI  Past Medical History Past Medical History:  Diagnosis Date   Anxiety    Depression    Schizo affective schizophrenia [facility])    Patient Active Problem List   Diagnosis Date Noted   Schizophrenia ([place]) 04/20/2019   Cannabis use disorder, moderate, dependence ([place]) 03/14/2016   Schizophrenia, paranoid ([place]) 05/31/2015   Tobacco use disorder 08/22/2014   Home Medication(s) Prior to Admission medications   Medication Sig Start Date End Date Taking? Authorizing Provider  ARIPiprazole ER (ABILIFY MAINTENA) 400 MG SUSR Inject 400 mg into the muscle every 30 (thirty) days. Patient not taking: Reported on 08/06/2021 03/15/16   Hildred Priest, MD  ARISTADA 882 MG/3.2ML prefilled syringe Inject 882 mg into the muscle every 30 (thirty) days. 04/17/21   [provider]  benztropine (COGENTIN) 1 MG tablet Take 2 mg by mouth daily. 05/01/21   [provider]  cyclobenzaprine (FLEXERIL) 5 MG tablet Take 5 mg by mouth 3 (three) times daily as needed. Patient not taking: Reported on 08/06/2021 05/15/21   [provider]  cyclobenzaprine (FLEXERIL) 5 MG tablet 1 tablet as needed 05/15/21   [provider]  haloperidol (HALDOL) 5 MG  tablet Take 5 mg by mouth at bedtime. Patient not taking: Reported on 08/06/2021 04/11/21   [provider]  ibuprofen (ADVIL) 600 MG tablet Take 1 tablet (600 mg total) by mouth every 8 (eight) hours as needed for moderate pain. Patient not taking: Reported on 05/14/2021 04/15/21   Evalee Jefferson, PA-C  lithium carbonate (ESKALITH) 450 MG CR tablet Take 450 mg by mouth 2 (two) times daily. Patient not taking: Reported on 08/06/2021 05/11/21   [provider]  naproxen (NAPROSYN) 500 MG tablet Take 500 mg by mouth 2 (two) times daily. Patient not taking: Reported on 08/06/2021 05/15/21   [provider]  naproxen (NAPROSYN) 500 MG tablet 1 tablet Patient not taking: Reported on 08/06/2021 05/15/21   [provider]  predniSONE (STERAPRED UNI-PAK 21 TAB) 10 MG (21) TBPK tablet Take by mouth. Patient not taking: Reported on 08/06/2021 05/15/21   [provider]  Past Surgical History History reviewed. No pertinent surgical history. Family History Family History  Problem Relation Age of Onset   Cancer Father     Social History Social History   Tobacco Use   Smoking status: Every Day    Packs/day: 1.00    Years: 15.00    Pack years: 15.00    Types: Cigarettes   Smokeless tobacco: Never  Vaping Use   Vaping Use: Never used  Substance Use Topics   Alcohol use: Yes    Comment: 40oz weekly   Drug use: Yes    Types: Marijuana, Cocaine    Comment: last cocaine use 08/03/21   Allergies Patient has no known allergies.  Review of Systems Review of Systems  Psychiatric/Behavioral:  Positive for suicidal ideas.   All other systems reviewed and are negative.  Physical Exam Vital Signs  I have reviewed the triage vital signs Ht 5\' 4"  (1.626 m)    Wt 70.3 kg    BMI 26.60 kg/m   Physical Exam Vitals and nursing note reviewed.   Constitutional:      General: He is not in acute distress.    Appearance: He is well-developed.  HENT:     Head: Normocephalic and atraumatic.  Eyes:     Conjunctiva/sclera: Conjunctivae normal.  Cardiovascular:     Rate and Rhythm: Normal rate and regular rhythm.     Heart sounds: No murmur heard. Pulmonary:     Effort: Pulmonary effort is normal. No respiratory distress.     Breath sounds: Normal breath sounds.  Abdominal:     Palpations: Abdomen is soft.     Tenderness: There is no abdominal tenderness.  Musculoskeletal:        General: No swelling.     Cervical back: Neck supple.  Skin:    General: Skin is warm and dry.     Capillary Refill: Capillary refill takes less than 2 seconds.  Neurological:     Mental Status: He is alert.  Psychiatric:        Mood and Affect: Mood normal.    ED Results and Treatments Labs (all labs ordered are listed, but only abnormal results are displayed) Labs Reviewed  RESP PANEL BY RT-PCR (FLU A&B, COVID) ARPGX2  COMPREHENSIVE METABOLIC PANEL  ETHANOL  RAPID URINE DRUG SCREEN, HOSP PERFORMED  CBC WITH DIFFERENTIAL/PLATELET                                                                                                                          Radiology No results found.  Pertinent labs & imaging results that were available during my care of the patient were reviewed by me and considered in my medical decision making (see MDM for details).  Medications Ordered in ED Medications - No data to display  Procedures Procedures  (including critical care time)  Medical Decision Making / ED Course   This patient presents to the ED for concern of suicidal ideation, this involves an extensive number of treatment options, and is a complaint that carries with it a high risk of complications and morbidity.  The  differential diagnosis includes major depression, schizophrenia, psychosis, illicit substance use  MDM: Patient seen Emergency Department for evaluation of suicidal ideation.  Physical exam is unremarkable.  Laboratory evaluation with leukocytosis to 15.1 but in the setting of no symptoms, this is nonspecific.  AST elevated to 68 likely elevated in the patient's history of alcohol use.  Ethanol is currently negative.  Patient is medically clear for psychiatric evaluation.  TTS evaluated the patient and is recommending inpatient admission.  Patient will board in the emergency department until appropriate placement can be arranged.   Additional history obtained:  -External records from outside source obtained and reviewed including: Chart review including previous notes, labs, imaging, consultation notes   Lab Tests: -I ordered, reviewed, and interpreted labs.   The pertinent results include:   Labs Reviewed  RESP PANEL BY RT-PCR (FLU A&B, COVID) ARPGX2  COMPREHENSIVE METABOLIC PANEL  ETHANOL  RAPID URINE DRUG SCREEN, HOSP PERFORMED  CBC WITH DIFFERENTIAL/PLATELET      EKG   EKG Interpretation  Date/Time:  Tuesday October 10 2021 03:19:34 EST Ventricular Rate:  87 PR Interval:  148 QRS Duration: 92 QT Interval:  354 QTC Calculation: 425 R Axis:   78 Text Interpretation: Normal sinus rhythm Normal ECG When compared with ECG of 01-Oct-2016 09:36, PREVIOUS ECG IS PRESENT Confirmed by Claris Pech ([id number]) on 10/10/2021 3:24:50 AM        Medicines ordered and prescription drug management: No orders of the defined types were placed in this encounter.   -I have reviewed the patients home medicines and have made adjustments as needed  Critical interventions none  Consultations Obtained: I requested consultation with the TTS,  and discussed lab and imaging findings as well as pertinent plan - they recommend: inpatient psych admission    Social Determinants of Health:  Factors  impacting patients care include: psychiatric disorder   Reevaluation: After the interventions noted above, I reevaluated the patient and found that they have :stayed the same  Co morbidities that complicate the patient evaluation  Past Medical History:  Diagnosis Date   Anxiety    Depression    Schizo affective schizophrenia ([place])       Dispostion: I considered admission for this patient, and after CT evaluation it appears patient will require inpatient psychiatric admission.     Final Clinical Impression(s) / ED Diagnoses Final diagnoses:  None     @PCDICTATION @    Teressa Lower, MD 10/10/21 [phone]

## 2021-10-10 NOTE — ED Triage Notes (Signed)
Pt states he is having si thoughts. His plan is to take pills or suicide by cop. Pt states he has been feeling this way for a few days.

## 2021-10-10 NOTE — ED Notes (Signed)
ED Provider at bedside. 

## 2021-10-10 NOTE — BH Assessment (Signed)
Clinician attempted to make contact with pt's team to complete pt's [place] Assessment but was unsuccessful. TTS to attempt assessment at a later time.

## 2021-10-10 NOTE — Discharge Instructions (Signed)
Cleared by behavioral health for discharge.  Follow-up as per behavioral health.

## 2021-10-10 NOTE — ED Provider Notes (Signed)
Patient cleared by behavioral health for discharge home.   Fredia Sorrow, MD 10/10/21 [phone]

## 2021-10-10 NOTE — ED Notes (Signed)
Pt having TTS assessment at this time

## 2021-10-10 NOTE — Consult Note (Signed)
Telepsych Consultation   Reason for Consult:  Suicidal Ideation Referring Physician:  Dr. Rogene Houston Location of Patient: [facility] Location of Provider: [facility]  Patient Identification: Jeffrey Coleman MRN:  [id number] Principal Diagnosis: <principal problem not specified>Anxiety, Depression, Schizophrenia Diagnosis:  Anxiety, Depression, Schizophrenia  Total Time spent with patient: 45 minutes  Subjective:  "I am feeling good." Patient was admitted to [facility] for suicidal ideation. Chart review indicated patient feeling suicidal that got worse 4 days ago. Pt stated triggers was getting into argument with the brother.  Objective information: Patient is alert and oriented X 3. He is seen via TelePsych, chart reviewed and case discussed with treatment team and DR. Dwyane Dee. Marland Kitchen  HPI:  Jeffrey Coleman is a 41 y.o. male patient admitted with suicidal ideation to [facility]. Pt noted he experienced SI in the past, most recently 6 weeks ago. Pt denied attempt to kill himself in the past,  but had been hospitalized for mental health concerns in the past. He acknowledged he has a plan to kill himself by "charging the police with a knife." Pt denied HI, AVH, access to guns/weapopns, or engagement with the legal system. Pt shared he engaged in NSSIB via burning himself; he states the last incident was 2 weeks ago on his arms. Pt states he smokes .5gram of marijuana every three days with the last time he smoked was yesterday. Endorsed drinking 10 oz of beer and use of cocaine. Slept 9 hours per night and appetite is back and good. Feels safe at home. Medication management with Elwin Mocha. Patient belongs to [facility] that administers monthly shots to patient.  Disposition: Patient is medically and Psychiatrically stable and would be discharged to home with mother. Mom currently at work and planned for patient aunt to pick patient up from hospital.   Past Psychiatric  History: See chart  Risk to Self:  no Risk to Others:  no Prior Inpatient Therapy:  Yes Prior Outpatient Therapy: Yes   Past Medical History:  Past Medical History:  Diagnosis Date   Anxiety    Depression    Schizo affective schizophrenia ([place])    History reviewed. No pertinent surgical history. Family History:  Family History  Problem Relation Age of Onset   Cancer Father    Family Psychiatric  History: See note Social History: See note Social History   Substance and Sexual Activity  Alcohol Use Yes   Comment: 40oz weekly     Social History   Substance and Sexual Activity  Drug Use Yes   Types: Marijuana, Cocaine   Comment: last cocaine use 08/03/21    Social History   Socioeconomic History   Marital status: Single    Spouse name: Not on file   Number of children: Not on file   Years of education: Not on file   Highest education level: Not on file  Occupational History   Not on file  Tobacco Use   Smoking status: Every Day    Packs/day: 1.00    Years: 15.00    Pack years: 15.00    Types: Cigarettes   Smokeless tobacco: Never  Vaping Use   Vaping Use: Never used  Substance and Sexual Activity   Alcohol use: Yes    Comment: 40oz weekly   Drug use: Yes    Types: Marijuana, Cocaine    Comment: last cocaine use 08/03/21   Sexual activity: Yes  Other Topics Concern   Not on file  Social  History Narrative   Not on file   Social Determinants of Health   Financial Resource Strain: Not on file  Food Insecurity: Not on file  Transportation Needs: Not on file  Physical Activity: Not on file  Stress: Not on file  Social Connections: Not on file   Additional Social History: See notes  Allergies:  No Known Allergies  Labs:  Results for orders placed or performed during the hospital encounter of 10/10/21 (from the past 48 hour(s))  Urine rapid drug screen (hosp performed)     Status: Abnormal   Collection Time: 10/10/21  2:59 AM  Result Value Ref Range    Opiates NONE DETECTED NONE DETECTED   Cocaine POSITIVE (A) NONE DETECTED   Benzodiazepines NONE DETECTED NONE DETECTED   Amphetamines NONE DETECTED NONE DETECTED   Tetrahydrocannabinol POSITIVE (A) NONE DETECTED   Barbiturates NONE DETECTED NONE DETECTED    Comment: (NOTE) DRUG SCREEN FOR MEDICAL PURPOSES ONLY.  IF CONFIRMATION IS NEEDED FOR ANY PURPOSE, NOTIFY LAB WITHIN 5 DAYS.  LOWEST DETECTABLE LIMITS FOR URINE DRUG SCREEN Drug Class                     Cutoff (ng/mL) Amphetamine and metabolites    1000 Barbiturate and metabolites    200 Benzodiazepine                 185 Tricyclics and metabolites     300 Opiates and metabolites        300 Cocaine and metabolites        300 THC                            50 Performed at [facility] [place], [place] [address]   Comprehensive metabolic panel     Status: Abnormal   Collection Time: 10/10/21  3:59 AM  Result Value Ref Range   Sodium 137 135 - 145 mmol/L   Potassium 3.5 3.5 - 5.1 mmol/L   Chloride 104 98 - 111 mmol/L   CO2 22 22 - 32 mmol/L   Glucose, Bld 110 (H) 70 - 99 mg/dL    Comment: Glucose reference range applies only to samples taken after fasting for at least 8 hours.   BUN 16 6 - 20 mg/dL   Creatinine, Ser 1.02 0.61 - 1.24 mg/dL   Calcium 8.8 (L) 8.9 - 10.3 mg/dL   Total Protein 7.3 6.5 - 8.1 g/dL   Albumin 4.2 3.5 - 5.0 g/dL   AST 68 (H) 15 - 41 U/L   ALT 25 0 - 44 U/L   Alkaline Phosphatase 51 38 - 126 U/L   Total Bilirubin 0.8 0.3 - 1.2 mg/dL   GFR, Estimated >60 >60 mL/min    Comment: (NOTE) Calculated using the CKD-EPI Creatinine Equation (2021)    Anion gap 11 5 - 15    Comment: Performed at [facility], [address], [place], [place] [address]  Ethanol     Status: None   Collection Time: 10/10/21  3:59 AM  Result Value Ref Range   Alcohol, Ethyl (B) <10 <10 mg/dL    Comment: (NOTE) Lowest detectable limit for serum alcohol is 10 mg/dL.  For medical purposes only. Performed  at [facility], [address] [place], [place] [address]   CBC with Diff     Status: Abnormal   Collection Time: 10/10/21  3:59 AM  Result Value Ref Range  WBC 15.1 (H) 4.0 - 10.5 K/uL   RBC 5.27 4.22 - 5.81 MIL/uL   Hemoglobin 15.7 13.0 - 17.0 g/dL   HCT 44.0 39.0 - 52.0 %   MCV 83.5 80.0 - 100.0 fL   MCH 29.8 26.0 - 34.0 pg   MCHC 35.7 30.0 - 36.0 g/dL   RDW 13.9 11.5 - 15.5 %   Platelets 302 150 - 400 K/uL   nRBC 0.0 0.0 - 0.2 %   Neutrophils Relative % 61 %   Neutro Abs 9.3 (H) 1.7 - 7.7 K/uL   Lymphocytes Relative 30 %   Lymphs Abs 4.5 (H) 0.7 - 4.0 K/uL   Monocytes Relative 7 %   Monocytes Absolute 1.1 (H) 0.1 - 1.0 K/uL   Eosinophils Relative 0 %   Eosinophils Absolute 0.1 0.0 - 0.5 K/uL   Basophils Relative 1 %   Basophils Absolute 0.1 0.0 - 0.1 K/uL   Immature Granulocytes 1 %   Abs Immature Granulocytes 0.07 0.00 - 0.07 K/uL    Comment: Performed at [facility], [address], [place], [place] [address]    Medications:  No current facility-administered medications for this encounter.   Current Outpatient Medications  Medication Sig Dispense Refill   ARISTADA 882 MG/3.2ML prefilled syringe Inject 882 mg into the muscle every 30 (thirty) days.     benztropine (COGENTIN) 1 MG tablet Take 2 mg by mouth daily.     ARIPiprazole ER (ABILIFY MAINTENA) 400 MG SUSR Inject 400 mg into the muscle every 30 (thirty) days. (Patient not taking: Reported on 08/06/2021) 1 each 0   ibuprofen (ADVIL) 600 MG tablet Take 1 tablet (600 mg total) by mouth every 8 (eight) hours as needed for moderate pain. (Patient not taking: Reported on 05/14/2021) 30 tablet 0    Musculoskeletal: Strength & Muscle Tone: within normal limits Gait & Station: normal Patient leans: N/A  Psychiatric Specialty Exam:  Presentation  General Appearance: Appropriate for Environment; Casual; Fairly Groomed  Eye Contact:Absent; Good  Speech:Clear and Coherent  Speech  Volume:Normal  Handedness:Right  Mood and Affect  Mood:Anxious; Depressed  Affect:Appropriate; Depressed  Thought Process  Thought Processes:Coherent  Descriptions of Associations:Intact  Orientation:Full (Time, Place and Person)  Thought Content:Logical  History of Schizophrenia/Schizoaffective disorder:Yes  Duration of Psychotic Symptoms:N/A  Hallucinations:No data recorded Ideas of Reference:None  Suicidal Thoughts:No data recorded Homicidal Thoughts:No data recorded  Sensorium  Memory:Immediate Good  Judgment:Fair  Insight:Fair  Executive Functions  Concentration:Good  Attention Span:Good  [place]  Psychomotor Activity  Psychomotor Activity:No data recorded  Assets  Assets:Housing; Social Support; Physical Health; Resilience  Sleep  Sleep:No data recorded  Physical Exam: Physical Exam Constitutional:      Appearance: Normal appearance.  HENT:     Head: Normocephalic and atraumatic.     Right Ear: External ear normal.     Left Ear: External ear normal.     Nose: Nose normal.     Mouth/Throat:     Mouth: Mucous membranes are moist.  Eyes:     Extraocular Movements: Extraocular movements intact.     Pupils: Pupils are equal, round, and reactive to light.  Cardiovascular:     Rate and Rhythm: Normal rate.     Pulses: Normal pulses.  Pulmonary:     Effort: Pulmonary effort is normal.  Abdominal:     Palpations: Abdomen is soft.  Musculoskeletal:     Cervical back: Normal range of motion and neck supple.  Skin:  General: Skin is warm.  Neurological:     General: No focal deficit present.     Mental Status: He is alert and oriented to person, place, and time.  Psychiatric:        Behavior: Behavior normal.   Review of Systems  Constitutional: Negative.   HENT: Negative.    Respiratory: Negative.    Gastrointestinal: Negative.   Genitourinary: Negative.   Musculoskeletal: Negative.    Skin: Negative.   Neurological: Negative.   Psychiatric/Behavioral:  Positive for depression. The patient is nervous/anxious.   Blood pressure (!) 134/95, pulse 91, temperature 98.8 F (37.1 C), temperature source Oral, resp. rate 18, height 5\' 4"  (1.626 m), weight 70.3 kg, SpO2 98 %. Body mass index is 26.6 kg/m.  Treatment Plan Summary: Daily contact with patient to assess and evaluate symptoms and progress in treatment and Medication management  Disposition: No evidence of imminent risk to self or others at present.   Patient does not meet criteria for psychiatric inpatient admission. Supportive therapy provided about ongoing stressors. Discussed crisis plan, support from social network, calling 911, coming to the Emergency Department, and calling Suicide Hotline.  This service was provided via telemedicine using a 2-way, interactive audio and video technology.  Names of all persons participating in this telemedicine service and their role in this encounter. Name: Jeffrey Coleman Role: Patient  Name: Garrison Columbus Role: NP  Name: Dr. Dwyane Dee Role: Medical Director  Name:  Role:     Laretta Bolster, FNP 10/10/2021 7:32 PM
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
## 2021-10-10 NOTE — ED Notes (Signed)
Urine specimen sent to lab

## 2021-11-17 ENCOUNTER — Emergency Department
Admission: EM | Admit: 2021-11-17 | Discharge: 2021-11-18 | Disposition: A | Discharge status: Other Acute Inpatient Hospital | Payer: No Typology Code available for payment source | Attending: Emergency Medicine | Admitting: Emergency Medicine

## 2021-11-17 ENCOUNTER — Other Ambulatory Visit: Payer: Self-pay

## 2021-11-17 DIAGNOSIS — Z20822 Contact with and (suspected) exposure to covid-19: Secondary | ICD-10-CM | POA: Diagnosis not present

## 2021-11-17 DIAGNOSIS — F200 Paranoid schizophrenia: Secondary | ICD-10-CM | POA: Diagnosis not present

## 2021-11-17 DIAGNOSIS — Z046 Encounter for general psychiatric examination, requested by authority: Secondary | ICD-10-CM | POA: Diagnosis present

## 2021-11-17 DIAGNOSIS — R45851 Suicidal ideations: Secondary | ICD-10-CM | POA: Diagnosis not present

## 2021-11-17 LAB — URINE DRUG SCREEN, QUALITATIVE (ARMC ONLY)
Amphetamines, Ur Screen: NOT DETECTED
Barbiturates, Ur Screen: NOT DETECTED
Benzodiazepine, Ur Scrn: NOT DETECTED
Cannabinoid 50 Ng, Ur ~~LOC~~: POSITIVE — AB
Cocaine Metabolite,Ur ~~LOC~~: NOT DETECTED
MDMA (Ecstasy)Ur Screen: NOT DETECTED
Methadone Scn, Ur: NOT DETECTED
Opiate, Ur Screen: NOT DETECTED
Phencyclidine (PCP) Ur S: NOT DETECTED
Tricyclic, Ur Screen: NOT DETECTED

## 2021-11-17 LAB — CBC
HCT: 40.2 % (ref 39.0–52.0)
Hemoglobin: 14 g/dL (ref 13.0–17.0)
MCH: 29.1 pg (ref 26.0–34.0)
MCHC: 34.8 g/dL (ref 30.0–36.0)
MCV: 83.6 fL (ref 80.0–100.0)
Platelets: 248 10*3/uL (ref 150–400)
RBC: 4.81 MIL/uL (ref 4.22–5.81)
RDW: 12.7 % (ref 11.5–15.5)
WBC: 7.5 10*3/uL (ref 4.0–10.5)
nRBC: 0 % (ref 0.0–0.2)

## 2021-11-17 LAB — COMPREHENSIVE METABOLIC PANEL
ALT: 37 U/L (ref 0–44)
AST: 22 U/L (ref 15–41)
Albumin: 4.1 g/dL (ref 3.5–5.0)
Alkaline Phosphatase: 52 U/L (ref 38–126)
Anion gap: 6 (ref 5–15)
BUN: 17 mg/dL (ref 6–20)
CO2: 30 mmol/L (ref 22–32)
Calcium: 9.3 mg/dL (ref 8.9–10.3)
Chloride: 102 mmol/L (ref 98–111)
Creatinine, Ser: 0.97 mg/dL (ref 0.61–1.24)
GFR, Estimated: >60 mL/min (ref >60)
Glucose, Bld: 76 mg/dL (ref 70–99)
Potassium: 4.5 mmol/L (ref 3.5–5.1)
Sodium: 138 mmol/L (ref 135–145)
Total Bilirubin: 0.3 mg/dL (ref 0.3–1.2)
Total Protein: 7.5 g/dL (ref 6.5–8.1)

## 2021-11-17 LAB — RESP PANEL BY RT-PCR (FLU A&B, COVID) ARPGX2
Influenza A by PCR: NEGATIVE
Influenza B by PCR: NEGATIVE
SARS Coronavirus 2 by RT PCR: NEGATIVE

## 2021-11-17 LAB — ETHANOL: Alcohol, Ethyl (B): <10 mg/dL (ref <10)

## 2021-11-17 NOTE — ED Notes (Signed)
Report to include Situation, Background, Assessment, and Recommendations received from [facility]. Patient alert and oriented, warm and dry, in no acute distress. Patient reported SI without a plan. He contracted for safety. Denied  HI, AVH and pain. Patient made aware of Q15 minute rounds and Engineer, drilling presence for their safety. Patient instructed to come to me with needs or concerns. ? ?

## 2021-11-17 NOTE — ED Provider Notes (Signed)
Jeffrey Coleman, psych nurse practitioner advised patient will be voluntary admission to psychiatry service. ?  Delman Kitten, MD ?11/17/21 2339 ? ?
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
## 2021-11-17 NOTE — ED Triage Notes (Addendum)
Pt to ED voluntarily with SI. Pt reports mom had recent surgery and has been having a hard time dealing with it. Pt reports plan was death by cop. Pt reports access to guns and medications at home. Pt reports has has SI thoughts in the past with no action. Pt reports alcohol use and marijuana on Wednesday. Pt with hx bipolar. Pt calm and cooperative in triage.  ? ?Pt belongings: ?1 black pair shoes ?1 pair white socks ?1 pair grey sweatpants  ?1 black T-shirt ?1 black hat  ?1 pair black underwear ?1 black jacket ?1 ID ?1 credit card ? ? ? ?

## 2021-11-18 DIAGNOSIS — F200 Paranoid schizophrenia: Secondary | ICD-10-CM | POA: Diagnosis not present

## 2021-11-18 DIAGNOSIS — R45851 Suicidal ideations: Secondary | ICD-10-CM

## 2021-11-18 NOTE — BH Assessment (Signed)
Referral information for Psychiatric Hospitalization faxed to: ? ?[facility] [phone]) ? ?[facility] [phone]),  ? ?[facility] [phone]), ? ?[name] [phone]),  ? ?[facility] [phone]- [phone]),  ? ?[facility] [phone]) ? ?[facility] [phone]). ? ?[facility] [phone]) ? ?

## 2021-11-18 NOTE — BH Assessment (Signed)
Patient has been accepted to [facility]  ?Patient assigned to [facility] ?Accepting physician is Dr. Randa Evens.  ?Call report to [phone].  ?Representative was [employer].  ? ?ER Staff is aware of it:  ?Melody, ER Secretary  ?Dr. Jacqualine Code, ER MD  ?Dyke Maes, Patient's Nurse ?    ?Patient can arrive at facility at 9 AM on 11/18/21.  ?

## 2021-11-18 NOTE — ED Provider Notes (Signed)
? ?[facility] ?Provider Note ? ? ? Event Date/Time  ? First MD Initiated Contact with Patient 11/17/21 1900   ?  (approximate) ? ? ?History  ? ?Suicidal ? ? ?HPI ? ?Eural Ciampi is a 41 y.o. male with a history of anxiety, depression, and schizoaffective disorder who presents with suicidal ideation over the last several days.  The patient states he has thought about attempting suicide by cop.  He states he has had increased life stressors.  He denies any acute medical complaints.  He denies drug use.  He states he has been compliant with his normal mental health medications. ? ?  ? ? ?Physical Exam  ? ?Triage Vital Signs: ?ED Triage Vitals  ?Enc Vitals Group  ?   BP 11/17/21 1844 (!) 123/96  ?   Pulse Rate 11/17/21 1844 61  ?   Resp 11/17/21 1844 18  ?   Temp 11/17/21 1844 98.1 ?F (36.7 ?C)  ?   Temp Source 11/17/21 1844 Oral  ?   SpO2 11/17/21 1844 97 %  ?   Weight 11/17/21 1845 160 lb (72.6 kg)  ?   Height 11/17/21 1845 '5\' 4"'$  (1.626 m)  ?   Head Circumference --   ?   Peak Flow --   ?   Pain Score 11/17/21 1845 0  ?   Pain Loc --   ?   Pain Edu? --   ?   Excl. in [place]? --   ? ? ?Most recent vital signs: ?Vitals:  ? 11/17/21 1844  ?BP: (!) 123/96  ?Pulse: 61  ?Resp: 18  ?Temp: 98.1 ?F (36.7 ?C)  ?SpO2: 97%  ? ? ? ?General: Awake, no distress.  ?CV:  Good peripheral perfusion.  ?Resp:  Normal effort.  ?Abd:  No distention.  ?Other:  Calm and cooperative. ? ? ?ED Results / Procedures / Treatments  ? ?Labs ?(all labs ordered are listed, but only abnormal results are displayed) ?Labs Reviewed  ?URINE DRUG SCREEN, QUALITATIVE ([facility] ONLY) - Abnormal; Notable for the following components:  ?    Result Value  ? Cannabinoid 50 Ng, Ur [place] POSITIVE (*)   ? All other components within normal limits  ?RESP PANEL BY RT-PCR (FLU A&B, COVID) [id number]  ?COMPREHENSIVE METABOLIC PANEL  ?ETHANOL  ?CBC  ? ? ? ?EKG ? ? ? ? ?RADIOLOGY ? ? ?PROCEDURES: ? ?Critical Care performed: No ? ?Procedures ? ? ?MEDICATIONS  ORDERED IN ED: ?Medications - No data to display ? ? ?IMPRESSION / MDM / ASSESSMENT AND PLAN / ED COURSE  ?I reviewed the triage vital signs and the nursing notes. ? ?41 year old male with PMH as noted above presents with worsening depression and suicidal ideation although has not attempted to harm himself.  He has no acute medical complaints. ? ?On exam the patient is comfortable appearing.  His vital signs are normal.  The physical exam is otherwise unremarkable. ? ?At this time, the patient does not report an active suicidal plan, presented voluntarily to get help, and is able to contract for safety.  Therefore I will not put him under involuntary commitment at this time.  I consulted psychiatry and TTS.  Disposition will be based on their recommendations. ? ? ? ?FINAL CLINICAL IMPRESSION(S) / ED DIAGNOSES  ? ?Final diagnoses:  ?Suicidal ideation  ? ? ? ?Rx / DC Orders  ? ?ED Discharge Orders   ? ? None  ? ?  ? ? ? ?Note:  This document was  prepared using Systems analyst and may include unintentional dictation errors.  ?  Arta Silence, MD ?11/18/21 0020 ? ?

## 2021-11-18 NOTE — ED Notes (Signed)
VS not taken, patient asleep 

## 2021-11-18 NOTE — ED Notes (Signed)
Pt escorted out to lobby for transport to [facility]. Given all his personal belongings. Stable condition. A&O x4, ambulatory and in NAD. ?

## 2021-11-18 NOTE — Consult Note (Signed)
[facility] Face-to-Face Psychiatry Consult  ? ?Reason for Consult:  Psychiatric evaluation ?Referring Physician:  Dr. Cherylann Banas ?Patient Identification: Jeffrey Coleman ?MRN:  [id number] ?Principal Diagnosis: Schizophrenia, paranoid ([place]) ?Diagnosis:  Principal Problem: ?  Schizophrenia, paranoid ([place]) ? ? ?Total Time spent with patient: 45 minutes ? ?Subjective:   ?"I'm going through it" ? ?HPI:  Jeffrey Coleman, 41 y.o., male patient seen via tele health by this provider; chart reviewed and consulted with Dr. Cherylann Banas on 11/18/21.  On evaluation Jeffrey Coleman reports that he is here because he is having a rough time dealing with witnessing his mother and brother having sex. At this time he endorses suicide with a plan to die by police.  He also endorses audio and visual hallucinations.   ? ?Per TTS, pt began the assessment with ?I'm having a lot of problems at home. I caught my mom and my brother having sex with each other.? Pt was anxious and tense throughout the interview. Pt's speech was clear and coherent; pt.'s thought processes were relevant and intact. Pt avoided making eye contact. Pt's mood was depressed; affect was congruent with mood. Patient was noted to have poor insight, evidenced expressing no desire to stop using cannabis. Pt reported that smokes marijuana, daily; last use 11/16/21. BAL is unremarkable; UDS + for cannabis, tetrahydrocannabanoid, and cocaine. Pt had impaired judgment and was preoccupied with conflict between him and his family. Pt endorsed thoughts of SI with a plan to die by way of suicide by cop. Pt stated, ?I've been having thoughts off and on for 6 months?. The pt also admitted to having command hallucinations that tell him to act out, break things, or hit things. ? ?Recommendation: Psychiatric inpatient hospitalization ? ? ?Past Psychiatric History: schizophrenia ? ?Risk to Self:   ?Risk to Others:   ?Prior Inpatient Therapy:   ?Prior Outpatient Therapy:   ? ?Past Medical History:   ?Past Medical History:  ?Diagnosis Date  ? Anxiety   ? Depression   ? Schizo affective schizophrenia ([place])   ? History reviewed. No pertinent surgical history. ?Family History:  ?Family History  ?Problem Relation Age of Onset  ? Cancer Father   ? ?Family Psychiatric  History: Unknown ?Social History:  ?Social History  ? ?Substance and Sexual Activity  ?Alcohol Use Yes  ? Comment: 40oz weekly  ?   ?Social History  ? ?Substance and Sexual Activity  ?Drug Use Yes  ? Types: Marijuana, Cocaine  ? Comment: last cocaine use 08/03/21  ?  ?Social History  ? ?Socioeconomic History  ? Marital status: Single  ?  Spouse name: Not on file  ? Number of children: Not on file  ? Years of education: Not on file  ? Highest education level: Not on file  ?Occupational History  ? Not on file  ?Tobacco Use  ? Smoking status: Every Day  ?  Packs/day: 1.00  ?  Years: 15.00  ?  Pack years: 15.00  ?  Types: Cigarettes  ? Smokeless tobacco: Never  ?Vaping Use  ? Vaping Use: Never used  ?Substance and Sexual Activity  ? Alcohol use: Yes  ?  Comment: 40oz weekly  ? Drug use: Yes  ?  Types: Marijuana, Cocaine  ?  Comment: last cocaine use 08/03/21  ? Sexual activity: Yes  ?Other Topics Concern  ? Not on file  ?Social History Narrative  ? Not on file  ? ?Social Determinants of Health  ? ?Financial Resource Strain: Not on file  ?Food Insecurity: Not on  file  ?Transportation Needs: Not on file  ?Physical Activity: Not on file  ?Stress: Not on file  ?Social Connections: Not on file  ? ?Additional Social History: ?  ? ?Allergies:  No Known Allergies ? ?Labs:  ?Results for orders placed or performed during the hospital encounter of 11/17/21 (from the past 48 hour(s))  ?Urine Drug Screen, Qualitative     Status: Abnormal  ? Collection Time: 11/17/21  6:47 PM  ?Result Value Ref Range  ? Tricyclic, Ur Screen NONE DETECTED NONE DETECTED  ? Amphetamines, Ur Screen NONE DETECTED NONE DETECTED  ? MDMA (Ecstasy)Ur Screen NONE DETECTED NONE DETECTED  ? Cocaine  Metabolite,Ur [place] NONE DETECTED NONE DETECTED  ? Opiate, Ur Screen NONE DETECTED NONE DETECTED  ? Phencyclidine (PCP) Ur S NONE DETECTED NONE DETECTED  ? Cannabinoid 50 Ng, Ur [place] POSITIVE (A) NONE DETECTED  ? Barbiturates, Ur Screen NONE DETECTED NONE DETECTED  ? Benzodiazepine, Ur Scrn NONE DETECTED NONE DETECTED  ? Methadone Scn, Ur NONE DETECTED NONE DETECTED  ?  Comment: (NOTE) ?Tricyclics + metabolites, urine    Cutoff 1000 ng/mL ?Amphetamines + metabolites, urine  Cutoff 1000 ng/mL ?MDMA (Ecstasy), urine              Cutoff 500 ng/mL ?Cocaine Metabolite, urine          Cutoff 300 ng/mL ?Opiate + metabolites, urine        Cutoff 300 ng/mL ?Phencyclidine (PCP), urine         Cutoff 25 ng/mL ?Cannabinoid, urine                 Cutoff 50 ng/mL ?Barbiturates + metabolites, urine  Cutoff 200 ng/mL ?Benzodiazepine, urine              Cutoff 200 ng/mL ?Methadone, urine                   Cutoff 300 ng/mL ? ?The urine drug screen provides only a preliminary, unconfirmed ?analytical test result and should not be used for non-medical ?purposes. Clinical consideration and professional judgment should ?be applied to any positive drug screen result due to possible ?interfering substances. A more specific alternate chemical method ?must be used in order to obtain a confirmed analytical result. ?Gas chromatography / mass spectrometry (GC/MS) is the preferred ?confirm atory method. ?Performed at [facility], [place], ?[place] [address] ?  ?Comprehensive metabolic panel     Status: None  ? Collection Time: 11/17/21  6:51 PM  ?Result Value Ref Range  ? Sodium 138 135 - 145 mmol/L  ? Potassium 4.5 3.5 - 5.1 mmol/L  ? Chloride 102 98 - 111 mmol/L  ? CO2 30 22 - 32 mmol/L  ? Glucose, Bld 76 70 - 99 mg/dL  ?  Comment: Glucose reference range applies only to samples taken after fasting for at least 8 hours.  ? BUN 17 6 - 20 mg/dL  ? Creatinine, Ser 0.97 0.61 - 1.24 mg/dL  ? Calcium 9.3 8.9 - 10.3 mg/dL  ?  Total Protein 7.5 6.5 - 8.1 g/dL  ? Albumin 4.1 3.5 - 5.0 g/dL  ? AST 22 15 - 41 U/L  ? ALT 37 0 - 44 U/L  ? Alkaline Phosphatase 52 38 - 126 U/L  ? Total Bilirubin 0.3 0.3 - 1.2 mg/dL  ? GFR, Estimated >60 >60 mL/min  ?  Comment: (NOTE) ?Calculated using the CKD-EPI Creatinine Equation (2021) ?  ? Anion gap 6 5 - 15  ?  Comment: Performed at [facility], [address] [place], [place] [address]  ?Ethanol     Status: None  ? Collection Time: 11/17/21  6:51 PM  ?Result Value Ref Range  ? Alcohol, Ethyl (B) <10 <10 mg/dL  ?  Comment: (NOTE) ?Lowest detectable limit for serum alcohol is 10 mg/dL. ? ?For medical purposes only. ?Performed at [facility], [place], ?[place] [address] ?  ?cbc     Status: None  ? Collection Time: 11/17/21  6:51 PM  ?Result Value Ref Range  ? WBC 7.5 4.0 - 10.5 K/uL  ? RBC 4.81 4.22 - 5.81 MIL/uL  ? Hemoglobin 14.0 13.0 - 17.0 g/dL  ? HCT 40.2 39.0 - 52.0 %  ? MCV 83.6 80.0 - 100.0 fL  ? MCH 29.1 26.0 - 34.0 pg  ? MCHC 34.8 30.0 - 36.0 g/dL  ? RDW 12.7 11.5 - 15.5 %  ? Platelets 248 150 - 400 K/uL  ? nRBC 0.0 0.0 - 0.2 %  ?  Comment: Performed at [facility], [address] [place],  [address]  ?Resp Panel by RT-PCR (Flu A&B, Covid) Nasopharyngeal Swab     Status: None  ? Collection Time: 11/17/21  8:22 PM  ? Specimen: Nasopharyngeal Swab; Nasopharyngeal(NP) swabs in vial transport medium  ?Result Value Ref Range  ? SARS Coronavirus 2 by RT PCR NEGATIVE NEGATIVE  ?  Comment: (NOTE) ?SARS-CoV-2 target nucleic acids are NOT DETECTED. ? ?The SARS-CoV-2 RNA is generally detectable in upper respiratory ?specimens during the acute phase of infection. The lowest ?concentration of SARS-CoV-2 viral copies this assay can detect is ?138 copies/mL. A negative result does not preclude SARS-Cov-2 ?infection and should not be used as the sole basis for treatment or ?other patient management decisions. A negative result may occur with  ?improper  specimen collection/handling, submission of specimen other ?than nasopharyngeal swab, presence of viral mutation(s) within the ?areas targeted by this assay, and inadequate number of viral ?copies(<138 copies/mL)

## 2021-11-18 NOTE — ED Notes (Signed)
Report called in to [place]. Pt advised of inpatient admission and he verbalized understanding of it and is agreeable to it. Awaiting transportation at this time. ?

## 2021-11-18 NOTE — ED Notes (Signed)
Vol/pending admission to [facility] after 9AM. ?

## 2021-12-08 ENCOUNTER — Other Ambulatory Visit: Payer: Self-pay

## 2021-12-08 ENCOUNTER — Encounter (HOSPITAL_COMMUNITY): Payer: Self-pay | Admitting: *Deleted

## 2021-12-08 ENCOUNTER — Emergency Department (HOSPITAL_COMMUNITY)
Admission: EM | Admit: 2021-12-08 | Discharge: 2021-12-08 | Disposition: A | Discharge status: Home / Self Care | Payer: Medicaid Other | Attending: Emergency Medicine | Admitting: Emergency Medicine

## 2021-12-08 DIAGNOSIS — Z4800 Encounter for change or removal of nonsurgical wound dressing: Secondary | ICD-10-CM | POA: Insufficient documentation

## 2021-12-08 DIAGNOSIS — Z5189 Encounter for other specified aftercare: Secondary | ICD-10-CM

## 2021-12-08 MED ORDER — OXYCODONE HCL 5 MG PO TABS
5.0000 mg | ORAL_TABLET | Freq: Once | ORAL | Status: AC
  Administered 2021-12-08: 5 mg via ORAL
  Filled 2021-12-08: qty 1

## 2021-12-08 NOTE — Discharge Instructions (Addendum)
It was a pleasure taking care of you today! ? ?It is very important that you pick up your prescriptions that are at [facility].  It is also very important that she maintain your scheduled follow-up appointment with the surgeons on 12/12/2021.  You may follow-up with your primary care provider as needed.  Return to the emergency department for experiencing increasing/worsening pain, drainage, redness, fever, worsening symptoms. ?

## 2021-12-08 NOTE — ED Provider Notes (Signed)
?[facility] ?Provider Note ? ? ?CSN: [id number] ?Arrival date & time: 12/08/21  1249 ? ?  ? ?History ? ?Chief Complaint  ?Patient presents with  ? Post-op Problem  ? ? ?Jeffrey Coleman is a 41 y.o. male who presents to the emergency department with concerns for postop problem onset today.  He notes that he was discharged from [place] on 12/03/2021 and was sent prescriptions for pain medication.  He has not picked up those prescriptions.  He presents today for pain medication.  He notes that at the home that he has been living at they have been giving him Tylenol for his symptoms.  Denies drainage, fever, chills, redness. ? ? ?Per patient chart review: Patient was admitted to the hospital at [facility] on 11/26/2021 due to a fall.  He had right hip surgery and surgery to his left pinky on 11/27/2021.  Patient was also evaluated at [facility] on 12/03/2021 in the emergency department.  At that time had negative imaging work-up and a consult with orthopedics who determined it was safe for him to go home at that time.  Patient was sent a prescription for Lovenox, Tylenol, Roxicodone.  Patient has a follow-up appoint with his orthopedist on 12/12/2021. ? ?The history is provided by the patient. No language interpreter was used.  ? ?  ? ?Home Medications ?Prior to Admission medications   ?Medication Sig Start Date End Date Taking? Authorizing Provider  ?ARIPiprazole ER (ABILIFY MAINTENA) 400 MG SUSR Inject 400 mg into the muscle every 30 (thirty) days. ?Patient not taking: Reported on 08/06/2021 03/15/16   Hildred Priest, MD  ?Emogene Morgan 882 MG/3.2ML prefilled syringe Inject 882 mg into the muscle every 30 (thirty) days. 04/17/21   [provider]  ?benztropine (COGENTIN) 1 MG tablet Take 2 mg by mouth daily. 05/01/21   [provider]  ?ibuprofen (ADVIL) 600 MG tablet Take 1 tablet (600 mg total) by mouth every 8 (eight) hours as needed for moderate pain. ?Patient not taking: Reported on 05/14/2021  04/15/21   Evalee Jefferson, PA-C  ?   ? ?Allergies    ?Patient has no known allergies.   ? ?Review of Systems   ?Review of Systems  ?Constitutional:  Negative for chills and fever.  ?Musculoskeletal:  Negative for gait problem.  ?Skin:  Positive for wound (Healing surgical site to right hip and left pinky.). Negative for color change.  ?All other systems reviewed and are negative. ? ?Physical Exam ?Updated Vital Signs ?BP 127/90 (BP Location: Right Arm)   Pulse 92   Temp 98.1 ?F (36.7 ?C) (Oral)   Resp 18   Ht '5\' 4"'$  (1.626 m)   Wt 74.8 kg   SpO2 97%   BMI 28.32 kg/m?  ?Physical Exam ?Vitals and nursing note reviewed.  ?Constitutional:   ?   General: He is not in acute distress. ?   Appearance: Normal appearance.  ?Eyes:  ?   General: No scleral icterus. ?   Extraocular Movements: Extraocular movements intact.  ?Cardiovascular:  ?   Rate and Rhythm: Normal rate.  ?Pulmonary:  ?   Effort: Pulmonary effort is normal. No respiratory distress.  ?Musculoskeletal:  ?   Cervical back: Neck supple.  ?   Comments: Well-healing surgical site to lateral right hip.  No signs of dehiscence, erythema, no tenderness to palpation to area.  Patient able to range right hip without difficulty.  Pin in place to left fifth proximal phalanx without signs of dehiscence or erythema.  Sutures in  place to dorsal aspect of left hand without signs of dehiscence or erythema.  Full active range of motion of hand.  ?Skin: ?   General: Skin is warm and dry.  ?   Findings: No bruising, erythema or rash.  ?Neurological:  ?   Mental Status: He is alert.  ?Psychiatric:     ?   Behavior: Behavior normal.  ? ? ?ED Results / Procedures / Treatments   ?Labs ?(all labs ordered are listed, but only abnormal results are displayed) ?Labs Reviewed - No data to display ? ?EKG ?None ? ?Radiology ?No results found. ? ?Procedures ?Procedures  ? ? ?Medications Ordered in ED ?Medications  ?oxyCODONE (Oxy IR/ROXICODONE) immediate release tablet 5 mg (has no  administration in time range)  ? ? ?ED Course/ Medical Decision Making/ A&P ?Clinical Course as of 12/08/21 1344  ?Fri Dec 08, 2021  ?1340 Discussed with patient discharge treatment plan consisting of importance of maintaining scheduled follow-up appointment with the surgeons on 12/12/2021 as well as importance of picking up his prescriptions that were sent on 12/03/2021 through the [place].  Patient agreeable at this time.  Answered all available questions.  Patient appears safe for discharge. [SB]  ?  ?Clinical Course User Index ?[SB] Jose Alleyne A, PA-C  ? ?                        ?Medical Decision Making ?Risk ?Prescription drug management. ? ? ?Pt presents with concerns for wound check onset today.  Patient notes that he did not pick up his prescriptions that were sent on 12/03/2021.  He was advised to call [facility] and have his doctor at [facility] send the prescriptions to another pharmacy for him however he has been unable to get in contact with that physician.  Vital signs stable, patient afebrile, not tachycardic or hypoxic.  On exam patient with well-healing surgical site to lateral right hip.  No signs of dehiscence, erythema, tenderness to palpation to the area.  Patient able to range right hip without difficulty.  Pin in place to the left fifth proximal phalanx without signs of dehiscence or erythema.  Sutures in place to dorsal aspect of left hand without signs of dehiscence or erythema. Differential diagnosis includes cellulitis, abscess, healing incision.  ? ?Additional history obtained:  ?External records from outside source obtained and reviewed including: Patient was admitted to the hospital at [facility] on 11/26/2021 due to a fall.  He had right hip surgery and surgery to his left pinky on 11/27/2021.  Patient was also evaluated at [facility] on 12/03/2021 in the emergency department.  At that time had negative imaging work-up and a consult with orthopedics who determined it was safe for him to go home at that time.   Patient was sent a prescription for Lovenox, Tylenol, Roxicodone.  Patient has a follow-up appoint with his orthopedist on 12/12/2021. ? ? ?Medications:  ?I ordered medication including oxycodone for pain management ?Reevaluation of the patient after these medicines and interventions, I reevaluated the patient and found that they have improved ?I have reviewed the patients home medicines and have made adjustments as needed ? ?Disposition: ?Patient presents today suspicious for wound check.  Doubt cellulitis or abscess at this time. After consideration of the diagnostic results and the patients response to treatment, I feel that the patient would benefit from Discharge home.  Case discussed with attending who evaluated patient and agrees with discharge treatment plan.  Discussed with patient importance  of following up with the providers at [facility] who sent him the prescriptions to obtain his prescriptions.  Patient given a dose of Roxicodone here in the emergency department prior to discharge.  Supportive care measures and strict return precautions discussed with patient at bedside. Pt acknowledges and verbalizes understanding. Pt appears safe for discharge. Follow up as indicated in discharge paperwork.  ? ? ?This chart was dictated using voice recognition software, Dragon. Despite the best efforts of this provider to proofread and correct errors, errors may still occur which can change documentation meaning. ? ? ?Final Clinical Impression(s) / ED Diagnoses ?Final diagnoses:  ?Visit for wound check  ? ? ?Rx / DC Orders ?ED Discharge Orders   ? ? None  ? ?  ? ? ?  ?Kem Parcher A, PA-C ?12/08/21 1423 ? ?  ?Dorie Rank, MD ?12/09/21 1618 ? ?

## 2021-12-08 NOTE — ED Triage Notes (Signed)
Patient discharged from [facility] 4 days ago after hip surgery and did not get medication for pain or clot prevention. ?

## 2022-01-04 ENCOUNTER — Emergency Department (HOSPITAL_COMMUNITY)
Admission: EM | Admit: 2022-01-04 | Discharge: 2022-01-05 | Disposition: A | Discharge status: Home / Self Care | Payer: Medicare Other | Attending: Emergency Medicine | Admitting: Emergency Medicine

## 2022-01-04 ENCOUNTER — Encounter (HOSPITAL_COMMUNITY): Payer: Self-pay | Admitting: Emergency Medicine

## 2022-01-04 DIAGNOSIS — F250 Schizoaffective disorder, bipolar type: Secondary | ICD-10-CM | POA: Diagnosis not present

## 2022-01-04 DIAGNOSIS — F17210 Nicotine dependence, cigarettes, uncomplicated: Secondary | ICD-10-CM | POA: Insufficient documentation

## 2022-01-04 DIAGNOSIS — Z046 Encounter for general psychiatric examination, requested by authority: Secondary | ICD-10-CM | POA: Diagnosis present

## 2022-01-04 DIAGNOSIS — Z20822 Contact with and (suspected) exposure to covid-19: Secondary | ICD-10-CM | POA: Diagnosis not present

## 2022-01-04 DIAGNOSIS — R45851 Suicidal ideations: Secondary | ICD-10-CM | POA: Insufficient documentation

## 2022-01-04 DIAGNOSIS — F1220 Cannabis dependence, uncomplicated: Secondary | ICD-10-CM | POA: Diagnosis not present

## 2022-01-04 LAB — COMPREHENSIVE METABOLIC PANEL
ALT: 17 U/L (ref 0–44)
AST: 13 U/L — ABNORMAL LOW (ref 15–41)
Albumin: 3.7 g/dL (ref 3.5–5.0)
Alkaline Phosphatase: 82 U/L (ref 38–126)
Anion gap: 5 (ref 5–15)
BUN: 8 mg/dL (ref 6–20)
CO2: 29 mmol/L (ref 22–32)
Calcium: 9 mg/dL (ref 8.9–10.3)
Chloride: 105 mmol/L (ref 98–111)
Creatinine, Ser: 0.83 mg/dL (ref 0.61–1.24)
GFR, Estimated: >60 mL/min (ref >60)
Glucose, Bld: 97 mg/dL (ref 70–99)
Potassium: 4.2 mmol/L (ref 3.5–5.1)
Sodium: 139 mmol/L (ref 135–145)
Total Bilirubin: 0.8 mg/dL (ref 0.3–1.2)
Total Protein: 7.2 g/dL (ref 6.5–8.1)

## 2022-01-04 LAB — RAPID URINE DRUG SCREEN, HOSP PERFORMED
Amphetamines: NOT DETECTED
Barbiturates: NOT DETECTED
Benzodiazepines: NOT DETECTED
Cocaine: NOT DETECTED
Opiates: NOT DETECTED
Tetrahydrocannabinol: POSITIVE — AB

## 2022-01-04 LAB — CBC
HCT: 41.4 % (ref 39.0–52.0)
Hemoglobin: 14.5 g/dL (ref 13.0–17.0)
MCH: 30 pg (ref 26.0–34.0)
MCHC: 35 g/dL (ref 30.0–36.0)
MCV: 85.7 fL (ref 80.0–100.0)
Platelets: 279 10*3/uL (ref 150–400)
RBC: 4.83 MIL/uL (ref 4.22–5.81)
RDW: 14.1 % (ref 11.5–15.5)
WBC: 7.5 10*3/uL (ref 4.0–10.5)
nRBC: 0 % (ref 0.0–0.2)

## 2022-01-04 LAB — RESP PANEL BY RT-PCR (FLU A&B, COVID) ARPGX2
Influenza A by PCR: NEGATIVE
Influenza B by PCR: NEGATIVE
SARS Coronavirus 2 by RT PCR: NEGATIVE

## 2022-01-04 LAB — ACETAMINOPHEN LEVEL: Acetaminophen (Tylenol), Serum: <10 ug/mL — ABNORMAL LOW (ref 10–30)

## 2022-01-04 LAB — SALICYLATE LEVEL: Salicylate Lvl: <7 mg/dL — ABNORMAL LOW (ref 7.0–30.0)

## 2022-01-04 LAB — ETHANOL: Alcohol, Ethyl (B): <10 mg/dL (ref <10)

## 2022-01-04 MED ORDER — OLANZAPINE 5 MG PO TBDP: 5.0000 mg | ORAL_TABLET | Freq: Three times a day (TID) | ORAL | Status: DC | PRN

## 2022-01-04 MED ORDER — ZIPRASIDONE MESYLATE 20 MG IM SOLR: 20.0000 mg | INTRAMUSCULAR | Status: DC | PRN

## 2022-01-04 MED ORDER — LORAZEPAM 1 MG PO TABS: 1.0000 mg | ORAL_TABLET | ORAL | Status: DC | PRN
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
## 2022-01-04 NOTE — ED Notes (Signed)
Patient taken to family room for TTS at this time. Cooperative with staff/assessment. ?
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
## 2022-01-04 NOTE — ED Triage Notes (Signed)
Pt brought in by [facility] from home. Pt states he stated to have suicidal thoughts this morning about 0330. Pt denies having a plan at this time. Hx of same.  ?

## 2022-01-04 NOTE — ED Provider Notes (Signed)
7:15 AM-checkout from Dr. Sedonia Small to evaluate patient for medical clearance and arrange for psychiatric evaluation. ? ?8:28 AM.  Patient is medically cleared.  No clinical or laboratory abnormalities.  TTS consultation already requested. ?  ?Jeffrey Bo, MD ?01/04/22 0830 ? ?

## 2022-01-05 DIAGNOSIS — F250 Schizoaffective disorder, bipolar type: Secondary | ICD-10-CM

## 2022-01-05 MED ORDER — BENZTROPINE MESYLATE 1 MG PO TABS: 1.0000 mg | ORAL_TABLET | Freq: Two times a day (BID) | ORAL | Status: DC

## 2022-01-05 NOTE — Consult Note (Signed)
Telepsych Consultation  ? ?Reason for Consult:  suicidal ideation ?Referring Physician:  Gerlene Fee, MD ?Location of Patient:  [facility] [phone] ?Location of Provider: [facility] Department ? ?Patient Identification: Jeffrey Coleman ?MRN:  [id number] ?Principal Diagnosis: Schizoaffective disorder, bipolar type ([place]) ?Diagnosis:  Principal Problem: ?  Schizoaffective disorder, bipolar type ([place]) ?Active Problems: ?  Cannabis use disorder, moderate, dependence ([place]) ?  Suicidal ideation ? ? ?Total Time spent with patient: 20 minutes ? ?Subjective:   ?Jeffrey Coleman is a 41 y.o. male patient admitted with suicidal ideations. ? ?Patient presents alert and oriented. Endorses getting IM injection last month, states it keeps voices "at a minimum", says voices never go away. Smoked "blunt" x2 weeks ago; denies ongoing use. Says he takes x2 1 mg Cogentin tabs/day. He denies any suicidal or homicidal thoughts, auditory or visual hallucinations. Provided verbal permission to speak with mother.  ? ?Collateral: [employer] [facility] [place] [phone] ?Darryl (vocational specialist) [phone] ?Confirmed patient is currently a client and receives care via team. Per chart review, pt received IM Abilify LAI 12/15/21.  Says patient has been doing well, saw pt within last week. Denies any safety concerns at this time.  ? ?Conrad Zajkowski (mother) [phone] no answer x3 ? ? ?HPI:  Jeffrey Coleman is a 41 year old male patient with past history of schizoaffective and substance abuse disorders who presented to [facility] voluntarily via EMS from home for suicidal thoughts. Patient currently receives services via [facility]; Abilify LAI, last received 12/15/21. UDS+THC, BAL<10. PDMP reviewed, Oxycodone Hcl 5 mg 30Tabs filled 12/13/21.  ? ?Past Psychiatric History: schizoaffective disorder, substance abuse disorder ? ?Risk to Self:  pt denies  ?Risk to Others:  pt denies ?Prior Inpatient  Therapy:  yes ?Prior Outpatient Therapy:  yes ? ?Past Medical History:  ?Past Medical History:  ?Diagnosis Date  ? Anxiety   ? Depression   ? Schizo affective schizophrenia ([place])   ? History reviewed. No pertinent surgical history. ?Family History:  ?Family History  ?Problem Relation Age of Onset  ? Cancer Father   ? ?Family Psychiatric  History: not noted ?Social History:  ?Social History  ? ?Substance and Sexual Activity  ?Alcohol Use Yes  ? Comment: 40oz weekly  ?   ?Social History  ? ?Substance and Sexual Activity  ?Drug Use Yes  ? Types: Marijuana, Cocaine  ? Comment: last cocaine use 08/03/21  ?  ?Social History  ? ?Socioeconomic History  ? Marital status: Single  ?  Spouse name: Not on file  ? Number of children: Not on file  ? Years of education: Not on file  ? Highest education level: Not on file  ?Occupational History  ? Not on file  ?Tobacco Use  ? Smoking status: Every Day  ?  Packs/day: 1.00  ?  Years: 15.00  ?  Pack years: 15.00  ?  Types: Cigarettes  ? Smokeless tobacco: Never  ?Vaping Use  ? Vaping Use: Never used  ?Substance and Sexual Activity  ? Alcohol use: Yes  ?  Comment: 40oz weekly  ? Drug use: Yes  ?  Types: Marijuana, Cocaine  ?  Comment: last cocaine use 08/03/21  ? Sexual activity: Yes  ?Other Topics Concern  ? Not on file  ?Social History Narrative  ? Not on file  ? ?Social Determinants of Health  ? ?Financial Resource Strain: Not on file  ?Food Insecurity: Not on file  ?Transportation Needs: Not on file  ?Physical  Activity: Not on file  ?Stress: Not on file  ?Social Connections: Not on file  ? ?Additional Social History: ?  ? ?Allergies:  No Known Allergies ? ?Labs:  ?Results for orders placed or performed during the hospital encounter of 01/04/22 (from the past 48 hour(s))  ?Rapid urine drug screen (hospital performed)     Status: Abnormal  ? Collection Time: 01/04/22  5:33 AM  ?Result Value Ref Range  ? Opiates NONE DETECTED NONE DETECTED  ? Cocaine NONE DETECTED NONE DETECTED  ?  Benzodiazepines NONE DETECTED NONE DETECTED  ? Amphetamines NONE DETECTED NONE DETECTED  ? Tetrahydrocannabinol POSITIVE (A) NONE DETECTED  ? Barbiturates NONE DETECTED NONE DETECTED  ?  Comment: (NOTE) ?DRUG SCREEN FOR MEDICAL PURPOSES ?ONLY.  IF CONFIRMATION IS NEEDED ?FOR ANY PURPOSE, NOTIFY LAB ?WITHIN 5 DAYS. ? ?LOWEST DETECTABLE LIMITS ?FOR URINE DRUG SCREEN ?Drug Class                     Cutoff (ng/mL) ?Amphetamine and metabolites    1000 ?Barbiturate and metabolites    200 ?Benzodiazepine                 200 ?Tricyclics and metabolites     300 ?Opiates and metabolites        300 ?Cocaine and metabolites        300 ?THC                            50 ?Performed at [facility], [address], [place], [place] [address] ?  ?Comprehensive metabolic panel     Status: Abnormal  ? Collection Time: 01/04/22  6:52 AM  ?Result Value Ref Range  ? Sodium 139 135 - 145 mmol/L  ? Potassium 4.2 3.5 - 5.1 mmol/L  ? Chloride 105 98 - 111 mmol/L  ? CO2 29 22 - 32 mmol/L  ? Glucose, Bld 97 70 - 99 mg/dL  ?  Comment: Glucose reference range applies only to samples taken after fasting for at least 8 hours.  ? BUN 8 6 - 20 mg/dL  ? Creatinine, Ser 0.83 0.61 - 1.24 mg/dL  ? Calcium 9.0 8.9 - 10.3 mg/dL  ? Total Protein 7.2 6.5 - 8.1 g/dL  ? Albumin 3.7 3.5 - 5.0 g/dL  ? AST 13 (L) 15 - 41 U/L  ? ALT 17 0 - 44 U/L  ? Alkaline Phosphatase 82 38 - 126 U/L  ? Total Bilirubin 0.8 0.3 - 1.2 mg/dL  ? GFR, Estimated >60 >60 mL/min  ?  Comment: (NOTE) ?Calculated using the CKD-EPI Creatinine Equation (2021) ?  ? Anion gap 5 5 - 15  ?  Comment: Performed at [facility], [address] [place], [place] [address]  ?Ethanol     Status: None  ? Collection Time: 01/04/22  6:52 AM  ?Result Value Ref Range  ? Alcohol, Ethyl (B) <10 <10 mg/dL  ?  Comment: (NOTE) ?Lowest detectable limit for serum alcohol is 10 mg/dL. ? ?For medical purposes only. ?Performed at [facility], [address] [place], [place] [address] ?  ?Salicylate level      Status: Abnormal  ? Collection Time: 01/04/22  6:52 AM  ?Result Value Ref Range  ? Salicylate Lvl <9.1 (L) 7.0 - 30.0 mg/dL  ?  Comment: Performed at [facility], [address] [place], [place] [address]  ?Acetaminophen level     Status: Abnormal  ? Collection Time:  01/04/22  6:52 AM  ?Result Value Ref Range  ? Acetaminophen (Tylenol), Serum <10 (L) 10 - 30 ug/mL  ?  Comment: (NOTE) ?Therapeutic concentrations vary significantly. A range of 10-30 ug/mL  ?may be an effective concentration for many patients. However, some  ?are best treated at concentrations outside of this range. ?Acetaminophen concentrations >150 ug/mL at 4 hours after ingestion  ?and >50 ug/mL at 12 hours after ingestion are often associated with  ?toxic reactions. ? ?Performed at [facility], [address], [place], [place] [address] ?  ?cbc     Status: None  ? Collection Time: 01/04/22  6:52 AM  ?Result Value Ref Range  ? WBC 7.5 4.0 - 10.5 K/uL  ? RBC 4.83 4.22 - 5.81 MIL/uL  ? Hemoglobin 14.5 13.0 - 17.0 g/dL  ? HCT 41.4 39.0 - 52.0 %  ? MCV 85.7 80.0 - 100.0 fL  ? MCH 30.0 26.0 - 34.0 pg  ? MCHC 35.0 30.0 - 36.0 g/dL  ? RDW 14.1 11.5 - 15.5 %  ? Platelets 279 150 - 400 K/uL  ? nRBC 0.0 0.0 - 0.2 %  ?  Comment: Performed at [facility], [address] [place],  [address]  ?Resp Panel by RT-PCR (Flu A&B, Covid) Nasopharyngeal Swab     Status: None  ? Collection Time: 01/04/22  2:27 PM  ? Specimen: Nasopharyngeal Swab; Nasopharyngeal(NP) swabs in vial transport medium  ?Result Value Ref Range  ? SARS Coronavirus 2 by RT PCR NEGATIVE NEGATIVE  ?  Comment: (NOTE) ?SARS-CoV-2 target nucleic acids are NOT DETECTED. ? ?The SARS-CoV-2 RNA is generally detectable in upper respiratory ?specimens during the acute phase of infection. The lowest ?concentration of SARS-CoV-2 viral copies this assay can detect is ?138 copies/mL. A negative result does not preclude SARS-Cov-2 ?infection and should not be used as the sole basis for treatment  or ?other patient management decisions. A negative result may occur with  ?improper specimen collection/handling, submission of specimen other ?than nasopharyngeal swab, presence of viral mutation(s) within the ?areas targeted

## 2022-01-05 NOTE — ED Notes (Signed)
Dr. Cline Cools informed the pt does not feel SI at this time and wishes to leave. ?

## 2022-01-05 NOTE — ED Notes (Signed)
Pt asleep. Vs delayed ?

## 2022-01-12 ENCOUNTER — Emergency Department (HOSPITAL_COMMUNITY)
Admission: EM | Admit: 2022-01-12 | Discharge: 2022-01-12 | Disposition: A | Discharge status: Home / Self Care | Payer: Medicare Other | Source: Home / Self Care

## 2022-01-13 ENCOUNTER — Emergency Department (HOSPITAL_COMMUNITY)
Admission: EM | Admit: 2022-01-13 | Discharge: 2022-02-01 | Disposition: E | Payer: Medicare Other | Attending: Emergency Medicine | Admitting: Emergency Medicine

## 2022-01-13 DIAGNOSIS — I469 Cardiac arrest, cause unspecified: Secondary | ICD-10-CM | POA: Diagnosis present

## 2022-02-01 NOTE — ED Provider Notes (Addendum)
?[facility] ?Provider Note ? ? ?CSN: [id number] ?Arrival date & time: Jan 28, 2022  1101 ? ?  ? ?History ? ?Chief complaint: Cardiac arrest. ? ?Jeffrey Coleman is a 41 y.o. male. ? ?HPI ?Patient has history of schizoaffective disorder, anxiety depression chronic tobacco use and alcohol use.  Patient also has history of cocaine use. ?Patient presented by EMS in cardiac arrest.  Family called EMS at about 930 as the patient was unresponsive.  Family initiated CPR.  Fire rescue evaluated the patient and ultimately EMS as well.  Patient was initially noted to be in PEA rhythm.  Ultimately went into asystole.  Patient was treated with Narcan for epi.  He had chest compressions and was given glucagon as CBG was 51 in the field.  EMS continued with CPR. ? ?Home Medications ?Prior to Admission medications   ?Medication Sig Start Date End Date Taking? Authorizing Provider  ?ARIPiprazole ER (ABILIFY MAINTENA) 400 MG SUSR Inject 400 mg into the muscle every 30 (thirty) days. ?Patient not taking: Reported on 01/04/2022 03/15/16   Hildred Priest, MD  ?Emogene Morgan 882 MG/3.2ML prefilled syringe Inject 882 mg into the muscle every 30 (thirty) days. 04/17/21   [provider]  ?benztropine (COGENTIN) 1 MG tablet Take 2 mg by mouth daily. 05/01/21   [provider]  ?ibuprofen (ADVIL) 600 MG tablet Take 1 tablet (600 mg total) by mouth every 8 (eight) hours as needed for moderate pain. 04/15/21   Evalee Jefferson, PA-C  ?   ? ?Allergies    ?Patient has no known allergies.   ? ?Review of Systems   ?Review of Systems ? ?Physical Exam ?Updated Vital Signs ?There were no vitals taken for this visit. ?Physical Exam ?Vitals and nursing note reviewed.  ?Constitutional:   ?   General: He is in acute distress.  ?   Appearance: He is well-developed.  ?HENT:  ?   Head: Normocephalic and atraumatic.  ?   Right Ear: External ear normal.  ?   Left Ear: External ear normal.  ?Eyes:  ?   General: No scleral icterus.     ?   Right eye: No discharge.     ?   Left eye: No discharge.  ?   Conjunctiva/sclera: Conjunctivae normal.  ?Neck:  ?   Trachea: No tracheal deviation.  ?Cardiovascular:  ?   Comments: Absent heart sounds ?Pulmonary:  ?   Comments: No spontaneous breath sounds,, good breath sounds with [name] airway ventilation ?Abdominal:  ?   General: There is no distension.  ?Musculoskeletal:     ?   General: No swelling or deformity.  ?   Cervical back: Neck supple.  ?Skin: ?   General: Skin is warm and dry.  ?   Findings: No rash.  ?Neurological:  ?   Mental Status: He is alert.  ?   Comments: GCS 3  ? ? ?ED Results / Procedures / Treatments   ?Labs ?(all labs ordered are listed, but only abnormal results are displayed) ?Labs Reviewed - No data to display ? ?EKG ?None ? ?Radiology ?No results found. ? ?Procedures ?Procedures  ? ? ?Medications Ordered in ED ?Medications - No data to display ? ?ED Course/ Medical Decision Making/ A&P ?Clinical Course as of 01/28/22 1154  ?Jan 28, 2022 Case discussed with medical examiner [JK]  ?  ?Clinical Course User Index ?[JK] Dorie Rank, MD  ? ?                        ?  Medical Decision Making ?Problems Addressed: ?Cardiac arrest [facility]): acute illness or injury that poses a threat to life or bodily functions ? ?Amount and/or Complexity of Data Reviewed ?External Data Reviewed: notes. ?   Details: Recently seen in the emergency department earlier this month.  Evaluated by psychiatry.  Patient was having hallucinations and suicidal thoughts.  Ultimately cleared for discharge ? ?Risk ?Risk Details: Patient presented with acute cardiac arrest.  Patient started CPR at 0 930 this morning.  Patient has undergone an hour and a half of CPR at this time.  He has been in PEA and asystole.  Bedside cardiac echo performed.  There was no heart movement.  Patient does not have any spontaneous respirations.  No heart sounds auscultated.  Patient declared dead at 11:06 AM ? ? ? ? ? ? ?Final Clinical  Impression(s) / ED Diagnoses ?Final diagnoses:  ?Cardiac arrest [facility])  ? ? ?Rx / DC Orders ?ED Discharge Orders   ? ? None  ? ?  ? ? ?  ?Dorie Rank, MD ?01-Feb-2022 1118 ? ?  ?Dorie Rank, MD ?02-01-2022 1154 ? ?

## 2022-02-01 NOTE — ED Triage Notes (Signed)
EMS called for unresponsive, family member initiated CPR.  Pt with hx of OD.  Reported pt uses cocaine.  2 Narcan given in field with no change.  4 of epi given in files as well.  CBG 51 on scene and glucagon given.  Reported pt down for an hour. IO started to right lower leg.  ?

## 2022-02-01 NOTE — ED Triage Notes (Addendum)
EDP in room at time of arrival.  No heart activity confirmed by EDP with [place]. Asystole on telemetry. Time of death called at 65 per EDP. ?

## 2022-02-01 NOTE — ED Notes (Signed)
[facility] contacted at [employer].  Ref # [id number] ? ?Contacted again to update next of kin information at 1217.   ?

## 2022-02-01 DEATH — deceased
